# Patient Record
Sex: Female | Born: 1950 | Race: Black or African American | Hispanic: No | Smoking: Former smoker
Health system: Southern US, Community
[De-identification: ages and names within clinical notes are randomized; demographics above are authoritative.]

## PROBLEM LIST (undated history)

## (undated) DIAGNOSIS — E559 Vitamin D deficiency, unspecified: Secondary | ICD-10-CM

## (undated) DIAGNOSIS — G4730 Sleep apnea, unspecified: Secondary | ICD-10-CM

## (undated) DIAGNOSIS — R0602 Shortness of breath: Secondary | ICD-10-CM

## (undated) DIAGNOSIS — J45909 Unspecified asthma, uncomplicated: Secondary | ICD-10-CM

## (undated) DIAGNOSIS — K219 Gastro-esophageal reflux disease without esophagitis: Secondary | ICD-10-CM

## (undated) DIAGNOSIS — M1711 Unilateral primary osteoarthritis, right knee: Secondary | ICD-10-CM

## (undated) DIAGNOSIS — E7800 Pure hypercholesterolemia, unspecified: Secondary | ICD-10-CM

## (undated) DIAGNOSIS — M1990 Unspecified osteoarthritis, unspecified site: Secondary | ICD-10-CM

## (undated) DIAGNOSIS — I10 Essential (primary) hypertension: Secondary | ICD-10-CM

## (undated) DIAGNOSIS — J439 Emphysema, unspecified: Secondary | ICD-10-CM

## (undated) DIAGNOSIS — Z91018 Allergy to other foods: Secondary | ICD-10-CM

## (undated) DIAGNOSIS — E739 Lactose intolerance, unspecified: Secondary | ICD-10-CM

## (undated) DIAGNOSIS — E119 Type 2 diabetes mellitus without complications: Secondary | ICD-10-CM

## (undated) DIAGNOSIS — J449 Chronic obstructive pulmonary disease, unspecified: Secondary | ICD-10-CM

## (undated) DIAGNOSIS — M2550 Pain in unspecified joint: Secondary | ICD-10-CM

## (undated) HISTORY — DX: Gastro-esophageal reflux disease without esophagitis: K21.9

## (undated) HISTORY — PX: TUBAL LIGATION: SHX77

## (undated) HISTORY — DX: Allergy to other foods: Z91.018

## (undated) HISTORY — DX: Lactose intolerance, unspecified: E73.9

## (undated) HISTORY — DX: Vitamin D deficiency, unspecified: E55.9

## (undated) HISTORY — DX: Emphysema, unspecified: J43.9

## (undated) HISTORY — DX: Pure hypercholesterolemia, unspecified: E78.00

## (undated) HISTORY — DX: Pain in unspecified joint: M25.50

## (undated) HISTORY — PX: HEMORRHOID SURGERY: SHX153

## (undated) HISTORY — PX: COLONOSCOPY: SHX174

## (undated) HISTORY — PX: JOINT REPLACEMENT: SHX530

## (undated) HISTORY — DX: Chronic obstructive pulmonary disease, unspecified: J44.9

---

## 1996-07-22 HISTORY — PX: REDUCTION MAMMAPLASTY: SUR839

## 1997-12-07 ENCOUNTER — Ambulatory Visit (HOSPITAL_COMMUNITY): Admission: RE | Admit: 1997-12-07 | Discharge: 1997-12-07 | Payer: Self-pay | Admitting: Gastroenterology

## 1998-01-18 ENCOUNTER — Other Ambulatory Visit: Admission: RE | Admit: 1998-01-18 | Discharge: 1998-01-18 | Payer: Self-pay | Admitting: Internal Medicine

## 1998-01-18 ENCOUNTER — Ambulatory Visit (HOSPITAL_COMMUNITY): Admission: RE | Admit: 1998-01-18 | Discharge: 1998-01-18 | Payer: Self-pay | Admitting: Internal Medicine

## 1998-07-13 ENCOUNTER — Ambulatory Visit (HOSPITAL_COMMUNITY): Admission: RE | Admit: 1998-07-13 | Discharge: 1998-07-13 | Payer: Self-pay | Admitting: Gastroenterology

## 1998-11-09 ENCOUNTER — Ambulatory Visit (HOSPITAL_COMMUNITY): Admission: RE | Admit: 1998-11-09 | Discharge: 1998-11-09 | Payer: Self-pay | Admitting: Surgery

## 1999-01-11 ENCOUNTER — Other Ambulatory Visit: Admission: RE | Admit: 1999-01-11 | Discharge: 1999-01-11 | Payer: Self-pay | Admitting: Internal Medicine

## 1999-01-11 ENCOUNTER — Encounter: Payer: Self-pay | Admitting: Internal Medicine

## 1999-01-11 ENCOUNTER — Ambulatory Visit (HOSPITAL_COMMUNITY): Admission: RE | Admit: 1999-01-11 | Discharge: 1999-01-11 | Payer: Self-pay | Admitting: Internal Medicine

## 1999-07-18 ENCOUNTER — Encounter: Payer: Self-pay | Admitting: Internal Medicine

## 1999-07-18 ENCOUNTER — Encounter: Admission: RE | Admit: 1999-07-18 | Discharge: 1999-07-18 | Payer: Self-pay | Admitting: Internal Medicine

## 2000-01-17 ENCOUNTER — Ambulatory Visit (HOSPITAL_COMMUNITY): Admission: RE | Admit: 2000-01-17 | Discharge: 2000-01-17 | Payer: Self-pay | Admitting: Internal Medicine

## 2000-01-17 ENCOUNTER — Other Ambulatory Visit: Admission: RE | Admit: 2000-01-17 | Discharge: 2000-01-17 | Payer: Self-pay | Admitting: Internal Medicine

## 2000-01-17 ENCOUNTER — Encounter: Payer: Self-pay | Admitting: Internal Medicine

## 2000-07-18 ENCOUNTER — Encounter: Admission: RE | Admit: 2000-07-18 | Discharge: 2000-07-18 | Payer: Self-pay | Admitting: Internal Medicine

## 2000-07-18 ENCOUNTER — Encounter: Payer: Self-pay | Admitting: Internal Medicine

## 2001-01-05 ENCOUNTER — Encounter: Payer: Self-pay | Admitting: Internal Medicine

## 2001-01-05 ENCOUNTER — Ambulatory Visit (HOSPITAL_COMMUNITY): Admission: RE | Admit: 2001-01-05 | Discharge: 2001-01-05 | Payer: Self-pay | Admitting: Internal Medicine

## 2001-01-05 ENCOUNTER — Other Ambulatory Visit: Admission: RE | Admit: 2001-01-05 | Discharge: 2001-01-05 | Payer: Self-pay | Admitting: Internal Medicine

## 2001-07-21 ENCOUNTER — Encounter: Admission: RE | Admit: 2001-07-21 | Discharge: 2001-07-21 | Payer: Self-pay | Admitting: Internal Medicine

## 2001-07-21 ENCOUNTER — Encounter: Payer: Self-pay | Admitting: Internal Medicine

## 2001-11-27 ENCOUNTER — Encounter (INDEPENDENT_AMBULATORY_CARE_PROVIDER_SITE_OTHER): Payer: Self-pay | Admitting: Specialist

## 2001-11-27 ENCOUNTER — Ambulatory Visit (HOSPITAL_BASED_OUTPATIENT_CLINIC_OR_DEPARTMENT_OTHER): Admission: RE | Admit: 2001-11-27 | Discharge: 2001-11-27 | Payer: Self-pay | Admitting: Surgery

## 2002-01-04 ENCOUNTER — Ambulatory Visit (HOSPITAL_COMMUNITY): Admission: RE | Admit: 2002-01-04 | Discharge: 2002-01-04 | Payer: Self-pay | Admitting: Internal Medicine

## 2002-01-04 ENCOUNTER — Encounter: Payer: Self-pay | Admitting: Internal Medicine

## 2002-01-04 ENCOUNTER — Other Ambulatory Visit: Admission: RE | Admit: 2002-01-04 | Discharge: 2002-01-04 | Payer: Self-pay | Admitting: Obstetrics and Gynecology

## 2002-03-03 ENCOUNTER — Ambulatory Visit (HOSPITAL_COMMUNITY): Admission: RE | Admit: 2002-03-03 | Discharge: 2002-03-03 | Payer: Self-pay | Admitting: Gastroenterology

## 2002-03-03 ENCOUNTER — Encounter: Payer: Self-pay | Admitting: Gastroenterology

## 2002-03-09 ENCOUNTER — Ambulatory Visit (HOSPITAL_COMMUNITY): Admission: RE | Admit: 2002-03-09 | Discharge: 2002-03-09 | Payer: Self-pay | Admitting: Gastroenterology

## 2002-07-21 ENCOUNTER — Encounter: Payer: Self-pay | Admitting: Emergency Medicine

## 2002-07-21 ENCOUNTER — Emergency Department (HOSPITAL_COMMUNITY): Admission: EM | Admit: 2002-07-21 | Discharge: 2002-07-21 | Payer: Self-pay | Admitting: Emergency Medicine

## 2002-07-23 ENCOUNTER — Encounter: Admission: RE | Admit: 2002-07-23 | Discharge: 2002-07-23 | Payer: Self-pay | Admitting: Internal Medicine

## 2002-07-23 ENCOUNTER — Encounter: Payer: Self-pay | Admitting: Internal Medicine

## 2003-03-21 ENCOUNTER — Encounter: Payer: Self-pay | Admitting: Internal Medicine

## 2003-03-21 ENCOUNTER — Other Ambulatory Visit: Admission: RE | Admit: 2003-03-21 | Discharge: 2003-03-21 | Payer: Self-pay | Admitting: Obstetrics and Gynecology

## 2003-03-21 ENCOUNTER — Encounter: Admission: RE | Admit: 2003-03-21 | Discharge: 2003-03-21 | Payer: Self-pay | Admitting: Internal Medicine

## 2003-08-23 ENCOUNTER — Emergency Department (HOSPITAL_COMMUNITY): Admission: EM | Admit: 2003-08-23 | Discharge: 2003-08-23 | Payer: Self-pay | Admitting: Emergency Medicine

## 2003-09-14 ENCOUNTER — Encounter: Admission: RE | Admit: 2003-09-14 | Discharge: 2003-09-14 | Payer: Self-pay | Admitting: Internal Medicine

## 2003-11-14 ENCOUNTER — Ambulatory Visit (HOSPITAL_BASED_OUTPATIENT_CLINIC_OR_DEPARTMENT_OTHER): Admission: RE | Admit: 2003-11-14 | Discharge: 2003-11-14 | Payer: Self-pay | Admitting: Orthopedic Surgery

## 2003-11-14 ENCOUNTER — Ambulatory Visit (HOSPITAL_COMMUNITY): Admission: RE | Admit: 2003-11-14 | Discharge: 2003-11-14 | Payer: Self-pay | Admitting: Orthopedic Surgery

## 2004-01-20 ENCOUNTER — Encounter: Admission: RE | Admit: 2004-01-20 | Discharge: 2004-01-20 | Payer: Self-pay | Admitting: Gastroenterology

## 2004-03-23 ENCOUNTER — Encounter: Admission: RE | Admit: 2004-03-23 | Discharge: 2004-03-23 | Payer: Self-pay | Admitting: Internal Medicine

## 2004-03-23 ENCOUNTER — Other Ambulatory Visit: Admission: RE | Admit: 2004-03-23 | Discharge: 2004-03-23 | Payer: Self-pay | Admitting: Obstetrics and Gynecology

## 2004-09-21 ENCOUNTER — Encounter: Admission: RE | Admit: 2004-09-21 | Discharge: 2004-09-21 | Payer: Self-pay | Admitting: Orthopedic Surgery

## 2004-11-05 ENCOUNTER — Encounter: Admission: RE | Admit: 2004-11-05 | Discharge: 2004-11-05 | Payer: Self-pay | Admitting: Internal Medicine

## 2004-12-14 ENCOUNTER — Encounter: Admission: RE | Admit: 2004-12-14 | Discharge: 2004-12-14 | Payer: Self-pay | Admitting: Gastroenterology

## 2005-05-15 ENCOUNTER — Other Ambulatory Visit: Admission: RE | Admit: 2005-05-15 | Discharge: 2005-05-15 | Payer: Self-pay | Admitting: Obstetrics and Gynecology

## 2005-06-17 ENCOUNTER — Encounter: Admission: RE | Admit: 2005-06-17 | Discharge: 2005-06-17 | Payer: Self-pay | Admitting: Internal Medicine

## 2005-07-22 HISTORY — PX: SHOULDER ARTHROSCOPY W/ ROTATOR CUFF REPAIR: SHX2400

## 2005-08-12 ENCOUNTER — Ambulatory Visit (HOSPITAL_BASED_OUTPATIENT_CLINIC_OR_DEPARTMENT_OTHER): Admission: RE | Admit: 2005-08-12 | Discharge: 2005-08-12 | Payer: Self-pay | Admitting: Orthopedic Surgery

## 2005-12-13 ENCOUNTER — Encounter: Admission: RE | Admit: 2005-12-13 | Discharge: 2005-12-13 | Payer: Self-pay | Admitting: Obstetrics and Gynecology

## 2006-06-20 ENCOUNTER — Encounter: Admission: RE | Admit: 2006-06-20 | Discharge: 2006-06-20 | Payer: Self-pay | Admitting: Internal Medicine

## 2006-12-16 ENCOUNTER — Encounter: Admission: RE | Admit: 2006-12-16 | Discharge: 2006-12-16 | Payer: Self-pay | Admitting: Internal Medicine

## 2007-03-11 ENCOUNTER — Encounter: Admission: RE | Admit: 2007-03-11 | Discharge: 2007-03-11 | Payer: Self-pay | Admitting: Internal Medicine

## 2007-07-21 ENCOUNTER — Encounter: Admission: RE | Admit: 2007-07-21 | Discharge: 2007-07-21 | Payer: Self-pay | Admitting: Internal Medicine

## 2007-11-27 IMAGING — MG MM SCREEN MAMMOGRAM BILATERAL
5 series · 5 of 5 positions shown · non-contrast
Comparison: none

DG SCREEN MAMMOGRAM BILATERAL
Bilateral CC and MLO view(s) were taken.

SCREENING MAMMOGRAM:
There is a fibrofatty pattern.  No masses or malignant type calcifications are identified.  
Compared with prior studies.

[R CC]
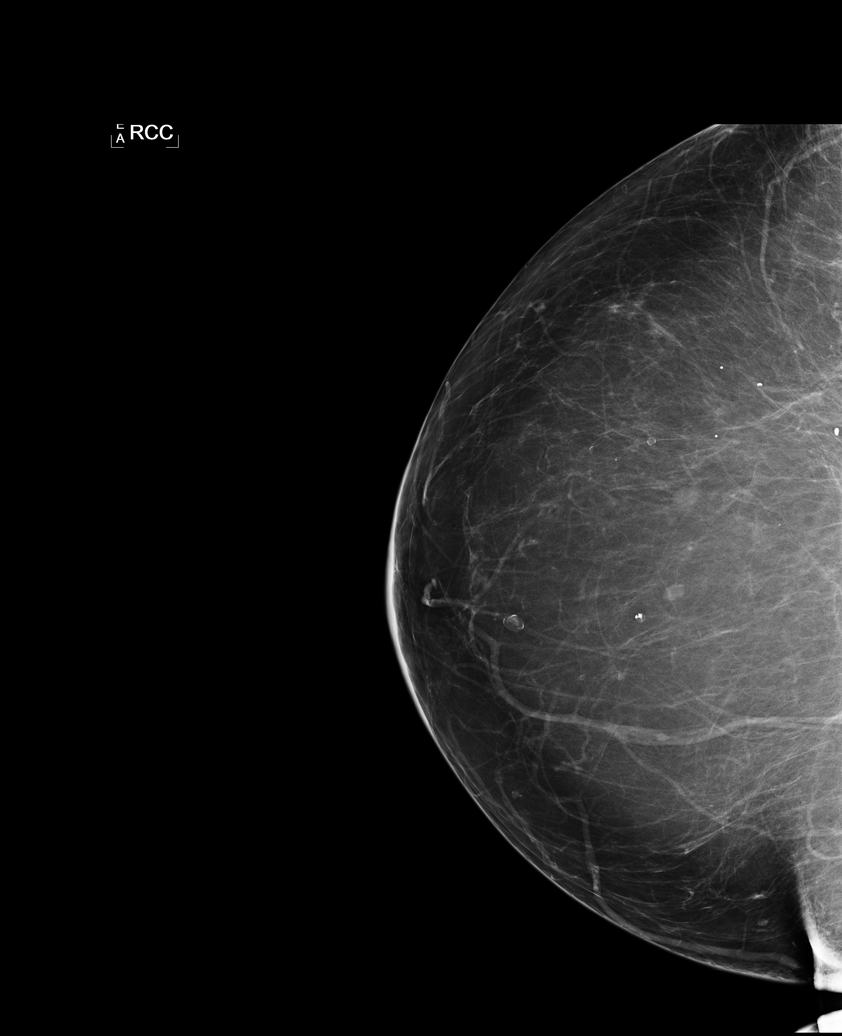

[L CC (1 of 2)]
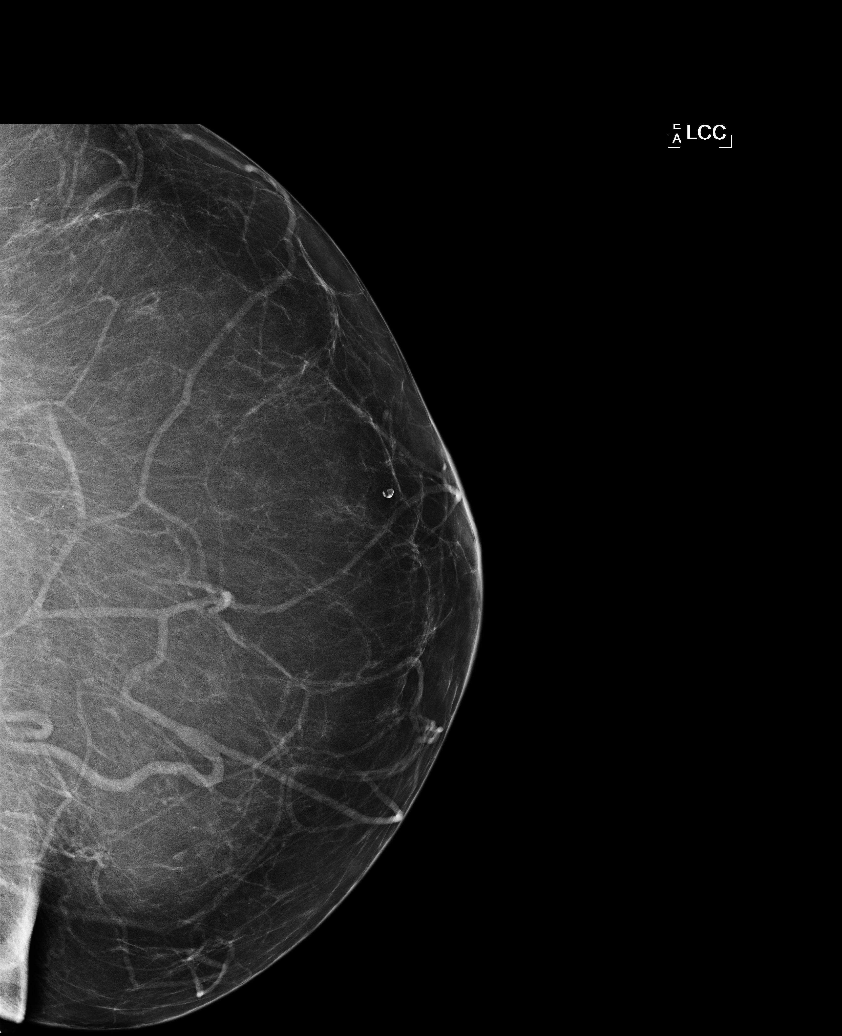

[L MLO]
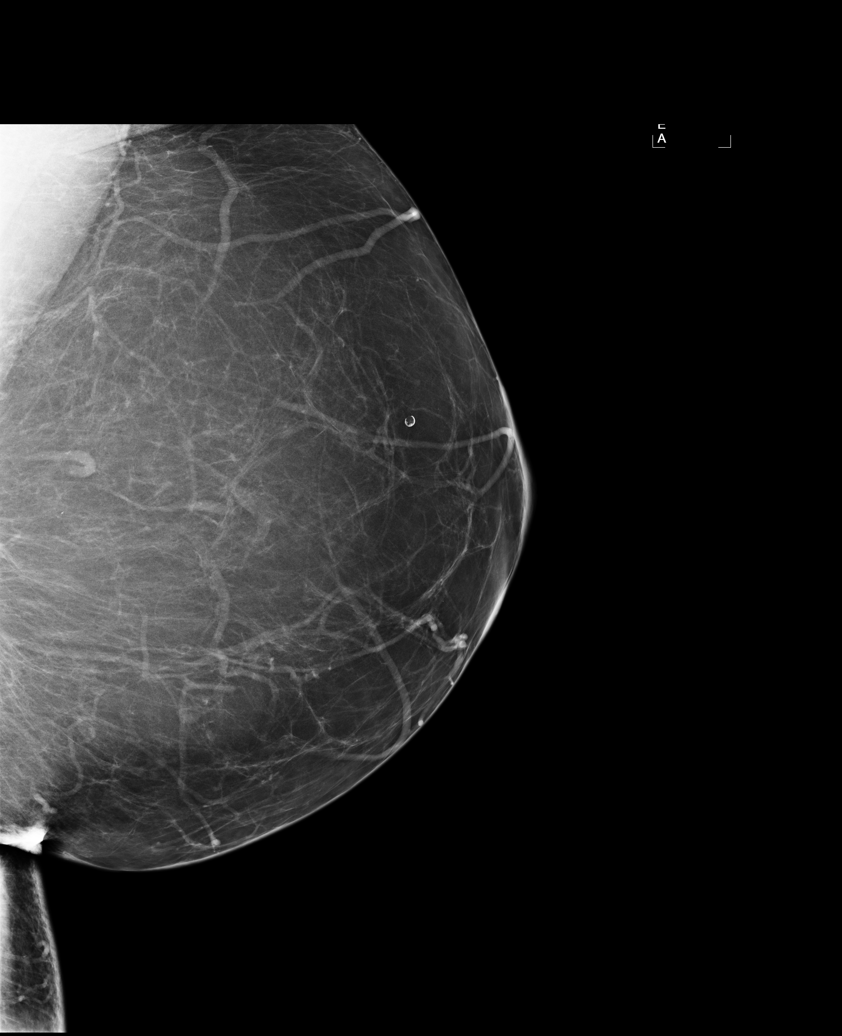

[R MLO]
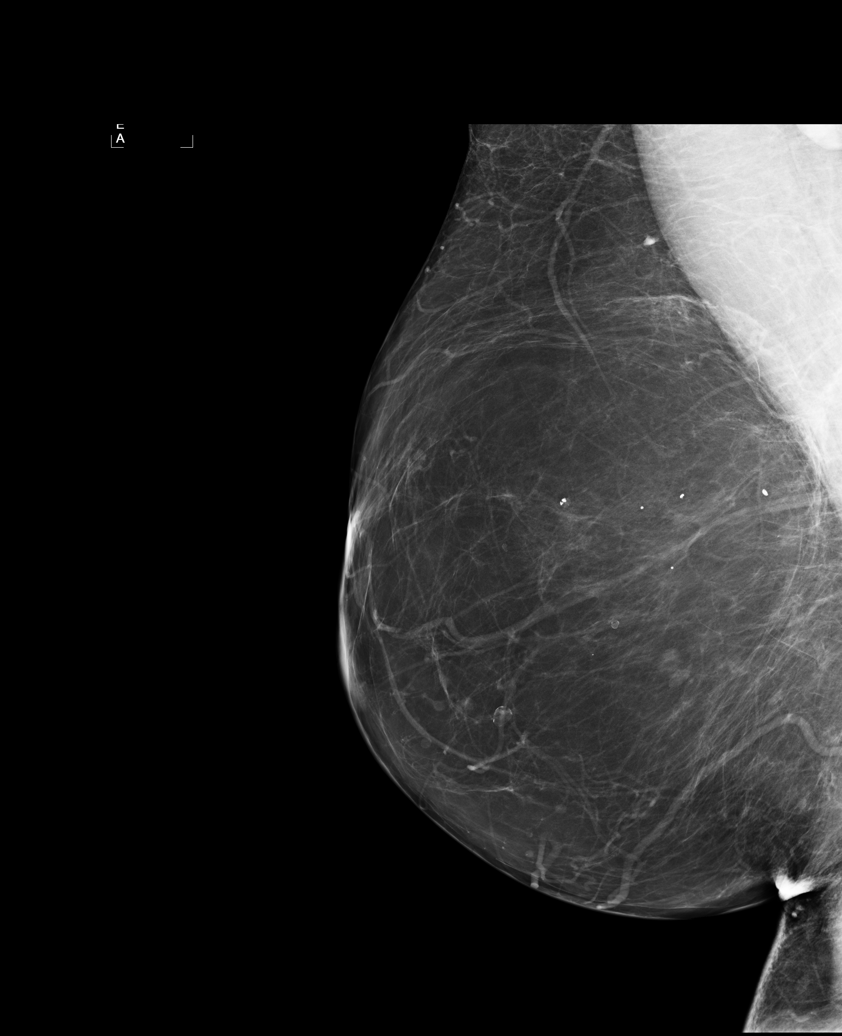

[L CC (2 of 2)]
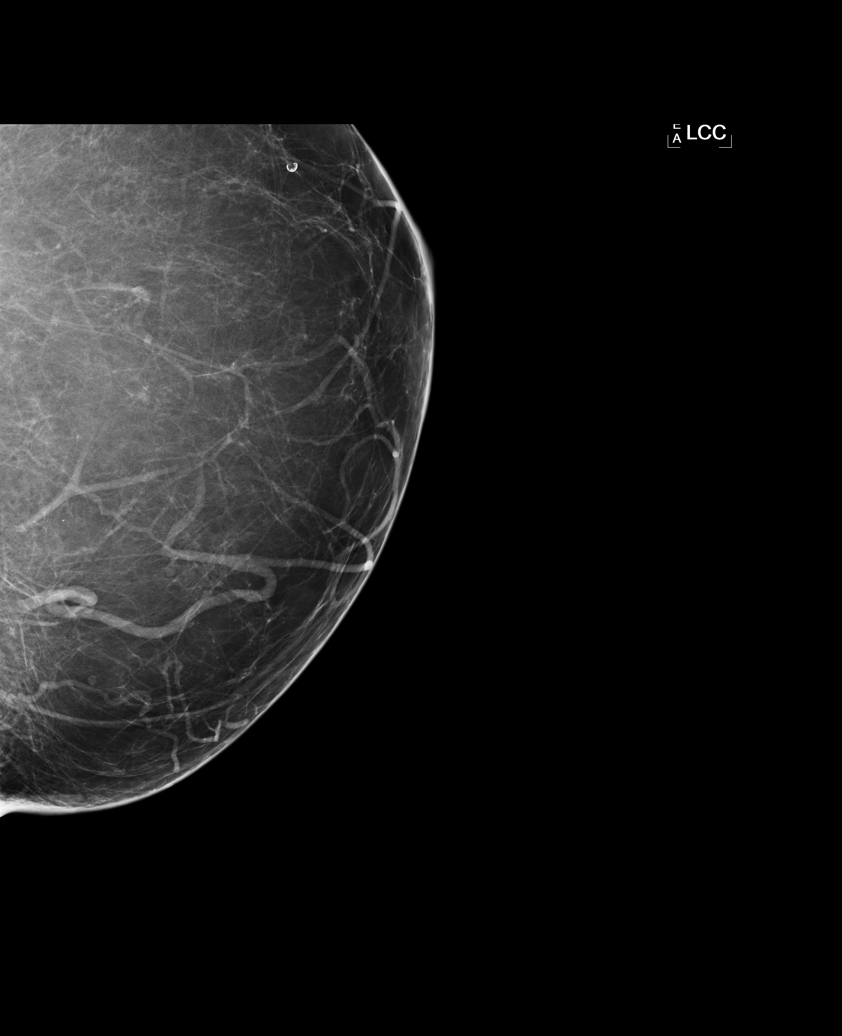

[5 of 5 positions shown; findings below may reference images not displayed]

IMPRESSION: No specific mammographic evidence of malignancy.  Next screening mammogram is recommended in one 
year.

ASSESSMENT: Negative - BI-RADS 1

Screening mammogram in 1 year.
ANALYZED BY COMPUTER AIDED DETECTION. , THIS PROCEDURE WAS A DIGITAL MAMMOGRAM.

## 2007-12-07 ENCOUNTER — Encounter: Admission: RE | Admit: 2007-12-07 | Discharge: 2007-12-07 | Payer: Self-pay | Admitting: Orthopedic Surgery

## 2007-12-22 ENCOUNTER — Encounter: Admission: RE | Admit: 2007-12-22 | Discharge: 2007-12-22 | Payer: Self-pay | Admitting: Obstetrics and Gynecology

## 2008-07-25 ENCOUNTER — Encounter: Admission: RE | Admit: 2008-07-25 | Discharge: 2008-07-25 | Payer: Self-pay | Admitting: Internal Medicine

## 2008-09-02 ENCOUNTER — Emergency Department (HOSPITAL_COMMUNITY): Admission: EM | Admit: 2008-09-02 | Discharge: 2008-09-02 | Payer: Self-pay | Admitting: Family Medicine

## 2009-01-10 ENCOUNTER — Encounter: Admission: RE | Admit: 2009-01-10 | Discharge: 2009-01-10 | Payer: Self-pay | Admitting: Internal Medicine

## 2009-09-29 HISTORY — PX: KNEE ARTHROSCOPY W/ MENISCECTOMY: SHX1879

## 2010-01-29 ENCOUNTER — Encounter: Admission: RE | Admit: 2010-01-29 | Discharge: 2010-01-29 | Payer: Self-pay | Admitting: Internal Medicine

## 2010-05-16 ENCOUNTER — Encounter
Admission: RE | Admit: 2010-05-16 | Discharge: 2010-05-16 | Payer: Self-pay | Source: Home / Self Care | Attending: Internal Medicine | Admitting: Internal Medicine

## 2010-09-17 ENCOUNTER — Inpatient Hospital Stay (INDEPENDENT_AMBULATORY_CARE_PROVIDER_SITE_OTHER)
Admission: RE | Admit: 2010-09-17 | Discharge: 2010-09-17 | Disposition: A | Discharge status: Home / Self Care | Payer: 59 | Source: Ambulatory Visit | Attending: Emergency Medicine | Admitting: Emergency Medicine

## 2010-09-17 DIAGNOSIS — J069 Acute upper respiratory infection, unspecified: Secondary | ICD-10-CM
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
## 2010-12-07 NOTE — Op Note (Signed)
   NAME:  Breanna Ross, Breanna Ross                         ACCOUNT NO.:  [id number]   MEDICAL RECORD NO.:  [id number]                   PATIENT TYPE:  AMB   LOCATION:  ENDO                                 FACILITY:  [facility]   PHYSICIAN:  Charna Elizabeth, M.D.                   DATE OF BIRTH:  [birth year]   DATE OF PROCEDURE:  03/09/2002  DATE OF DISCHARGE:                                 OPERATIVE REPORT   PROCEDURE PERFORMED:  Esophagogastroduodenoscopy.   ENDOSCOPIST:  Charna Elizabeth, M.D.   INSTRUMENT USED:  Olympus video panendoscope.   INDICATIONS FOR PROCEDURE:  The patient is a 60 year old African-American  female with history of epigastric right upper quadrant pain and chest pain.  Normal  abdominal ultrasound and HIDA scan, rule out peptic ulcer disease,  esophagitis, gastritis etc.   PREPROCEDURE PREPARATION:  Informed consent was procured from the patient.  The patient was fasted for eight hours prior to the procedure.   PREPROCEDURE PHYSICAL:  The patient had stable vital signs.  Neck supple.  Chest clear to auscultation.  S1, S2 regular.  Abdomen soft with normal  bowel sounds.   DESCRIPTION OF PROCEDURE:  The patient was placed in left lateral decubitus  position and sedated with 50 mg of Demerol and 5 mg of Versed intravenously.  Once the patient was adequately sedated and maintained on low-flow oxygen  and continuous cardiac monitoring, the Olympus video panendoscope was  advanced through the mouthpiece over the tongue into the esophagus under  direct vision.  The entire esophagus appeared normal with no evidence of  ring, strictures, masses or esophagitis or Barrett's mucosa.  The scope was  then advanced to the stomach.  The entire gastric mucosa and the proximal  small bowel appeared normal.  The patient tolerated the procedure well  without complications.   IMPRESSION:  Normal esophagogastroduodenoscopy.   RECOMMENDATIONS:  1. Proceed with a cardiac evaluation at this time  and possible stress test.  2. Outpatient follow-up in the next two weeks.  3. Use Nexium on a regular basis 40 mg 1 p.o. q.d.  4. Avoid all nonsteroidals including aspirin for now.                                               Charna Elizabeth, M.D.    JM/MEDQ  D:  03/09/2002  T:  03/10/2002  Job:  [id number]   cc:   Merlene Laughter. Renae Gloss, M.D.

## 2010-12-07 NOTE — Op Note (Signed)
[name], [name]               ACCOUNT NO.:  [id number]   MEDICAL RECORD NO.:  [id number]          PATIENT TYPE:  AMB   LOCATION:  DSC                          FACILITY:  [facility]   PHYSICIAN:  Robert A. Thurston Hole, M.D. DATE OF BIRTH:  12-06-1950   DATE OF PROCEDURE:  08/12/2005  DATE OF DISCHARGE:                                 OPERATIVE REPORT   PREOPERATIVE DIAGNOSES:  1.  Right shoulder rotator cuff tear, two years status post repair.  2.  Retained hardware/sutures, right shoulder.  3.  Right shoulder partial biceps tendon tear and partial labrum tear.   POSTOPERATIVE DIAGNOSES:  1.  Right shoulder rotator cuff tear, two years status post repair.  2.  Retained hardware/sutures, right shoulder.  3.  Right shoulder partial biceps tendon tear and partial labrum tear.   OPERATION PERFORMED:  1.  Right shoulder examination under anesthesia followed by arthroscopically      assisted rotator cuff revision repair using Arthrex suture anchors x2.  2.  Right shoulder removal of retained hardware/sutures.  3.  Right shoulder debridement, partial biceps tendon tear and partial      labrum tear.   SURGEON:  Elana Alm. Thurston Hole, M.D.   ASSISTANT:  Julien Girt, P.A.   ANESTHESIA:  General.   OPERATIVE TIME:  One hour.   COMPLICATIONS:  None.   INDICATIONS FOR PROCEDURE:  Breanna Ross is a 60 year old woman who had  previously undergone a right shoulder rotator cuff repair approximately two  years ago.  Subsequent to this, she had significant problems postoperatively  consistent with a possible retear of the repair versus poor healing of the  repair.  MRI done approximately 10 months ago has revealed retear of the  repair and thus she is to undergo arthroscopy, debridement and revision  repair if the remaining rotator cuff tear is repairable.   DESCRIPTION OF PROCEDURE:  [name] is brought to the operating room on  August 12, 2005 after an interscalene block was placed in  the holding room  by anesthesia.  She was placed on the operating table in supine position.  After being placed under general anesthesia, her right shoulder was  examined.  She had full range of motion and her shoulder was stable to  ligamentous exam.  She was then placed in a beach chair position and her  shoulder and arm were prepped using sterile DuraPrep and draped using  sterile technique.  She received Ancef 1 g IV preoperatively for  prophylaxis.  Initially, the arthroscopy was performed through a posterior  arthroscopic portal.  The arthroscope with a pump attached was placed and  through an anterior portal, an arthroscopic probe was placed.  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  She had 50% tearing in the biceps tendon which  was debrided, but the rest was intact.  She had partial tearing of the  superior anterior labrum which was debrided 25%.  The biceps tendon anchor  was intact.  The inferior labrum and anterior inferior glenohumeral ligament  complex was  intact.  The posterior labrum partial tearing 25% which was  debrided.  The rotator cuff showed a complete retear with retained suture  material in the retained portions of the rotator cuff tear which included  the supraspinatus and the infraspinatus and these sutures were removed.  A  lateral arthroscopic portal was made.  Moderately thickened bursitis was  resected.  After the sutures were removed, the rotator cuff tear was further  evaluated.  It did appear to be repairable.  It was debrided back to more  healthy-appearing tissue and then two separate Arthrex 5.5 mm suture anchors  were placed in the greater tuberosity after the bed area of the tuberosity  was repaired.  This was done through an accessory portal.  Each of these  anchors had two sutures in them and then each of the sutures were passed  arthroscopically through the rotator cuff  tear and then sequentially tied  down thus repairing the rotator cuff tear back down to the greater  tuberosity with a firm and tight repair.  After this was done, the shoulder  could be brought through a full range of motion with no impingement on the  repair.  At this point, it was felt that all pathology had been  satisfactorily addressed.  The instruments were removed.  Portals were  closed with 3-0 nylon sutures.  Sterile dressings and a sling applied.  The  patient was awakened and taken to recovery in stable condition.   FOLLOW-UP CARE:  Ms. Sieg will be followed as an outpatient on Percocet  for pain.  She will be seen back in the office in a week for suture removal  and follow-up.  Will begin early physical therapy with passive range of  motion only.      Robert A. Thurston Hole, M.D.  Electronically Signed     RAW/MEDQ  D:  08/12/2005  T:  08/13/2005  Job:  [id number]

## 2011-01-17 ENCOUNTER — Other Ambulatory Visit: Payer: Self-pay | Admitting: Internal Medicine

## 2011-01-17 DIAGNOSIS — Z1231 Encounter for screening mammogram for malignant neoplasm of breast: Secondary | ICD-10-CM

## 2011-02-08 ENCOUNTER — Ambulatory Visit
Admission: RE | Admit: 2011-02-08 | Discharge: 2011-02-08 | Disposition: A | Discharge status: Home / Self Care | Payer: 59 | Source: Ambulatory Visit | Attending: Internal Medicine | Admitting: Internal Medicine

## 2011-02-08 DIAGNOSIS — Z1231 Encounter for screening mammogram for malignant neoplasm of breast: Secondary | ICD-10-CM

## 2011-05-24 ENCOUNTER — Other Ambulatory Visit: Payer: Self-pay | Admitting: Internal Medicine

## 2011-05-24 ENCOUNTER — Ambulatory Visit
Admission: RE | Admit: 2011-05-24 | Discharge: 2011-05-24 | Disposition: A | Discharge status: Home / Self Care | Payer: 59 | Source: Ambulatory Visit | Attending: Internal Medicine | Admitting: Internal Medicine

## 2011-05-24 DIAGNOSIS — R0602 Shortness of breath: Secondary | ICD-10-CM

## 2011-05-24 DIAGNOSIS — R05 Cough: Secondary | ICD-10-CM

## 2011-05-24 DIAGNOSIS — Z87891 Personal history of nicotine dependence: Secondary | ICD-10-CM

## 2011-05-24 DIAGNOSIS — R059 Cough, unspecified: Secondary | ICD-10-CM

## 2011-07-22 ENCOUNTER — Encounter: Payer: Self-pay | Admitting: *Deleted

## 2011-07-22 ENCOUNTER — Emergency Department (HOSPITAL_COMMUNITY)
Admission: EM | Admit: 2011-07-22 | Discharge: 2011-07-22 | Disposition: A | Discharge status: Home / Self Care | Payer: 59 | Source: Home / Self Care | Attending: Family Medicine | Admitting: Family Medicine

## 2011-07-22 DIAGNOSIS — J069 Acute upper respiratory infection, unspecified: Secondary | ICD-10-CM

## 2011-07-22 HISTORY — DX: Essential (primary) hypertension: I10

## 2011-07-22 MED ORDER — AZITHROMYCIN 250 MG PO TABS: ORAL_TABLET | ORAL | Status: AC

## 2011-07-22 MED ORDER — IPRATROPIUM BROMIDE 0.06 % NA SOLN: 2.0000 | Freq: Four times a day (QID) | NASAL | Status: DC

## 2011-07-22 MED ORDER — HYDROCOD POLST-CHLORPHEN POLST 10-8 MG/5ML PO LQCR: 5.0000 mL | Freq: Two times a day (BID) | ORAL | Status: DC

## 2011-07-22 NOTE — ED Provider Notes (Signed)
History     CSN: [id number]  Arrival date & time 07/22/11  1002   First MD Initiated Contact with Patient 07/22/11 1301      Chief Complaint  Patient presents with  . Cough    (Consider location/radiation/quality/duration/timing/severity/associated sxs/prior treatment) Patient is a 60 y.o. female presenting with cough. The history is provided by the patient.  Cough This is a new problem. The current episode started more than 2 days ago. The problem has been gradually worsening. The cough is non-productive. The maximum temperature recorded prior to her arrival was 100 to 100.9 F. The fever has been present for less than 1 day. Associated symptoms include ear pain, rhinorrhea and sore throat. Pertinent negatives include no wheezing. She has tried cough syrup for the symptoms. The treatment provided no relief. She is not a smoker.    Past Medical History  Diagnosis Date  . Hypertension     Past Surgical History  Procedure Date  . Breast surgery   . Hemorrhoid surgery     Family History  Problem Relation Age of Onset  . Hypertension Mother   . Hypertension Father     History  Substance Use Topics  . Smoking status: Never Smoker   . Smokeless tobacco: Not on file  . Alcohol Use: No    OB History    Grav Para Term Preterm Abortions TAB SAB Ect Mult Living                  Review of Systems  Constitutional: Positive for fever.  HENT: Positive for ear pain, congestion, sore throat, rhinorrhea and postnasal drip.   Respiratory: Positive for cough. Negative for wheezing.   Gastrointestinal: Negative.   Skin: Negative.     Allergies  Penicillins  Home Medications   Current Outpatient Rx  Name Route Sig Dispense Refill  . AZITHROMYCIN 250 MG PO TABS  Take as directed on pack 6 each 0  . HYDROCOD POLST-CHLORPHEN POLST 10-8 MG/5ML PO LQCR Oral Take 5 mLs by mouth every 12 (twelve) hours. 115 mL 0  . IPRATROPIUM BROMIDE 0.06 % NA SOLN Nasal Place 2 sprays into the  nose 4 (four) times daily. 15 mL 12    BP 135/94  Pulse 90  Temp(Src) 98.4 F (36.9 C) (Oral)  Resp 22  SpO2 97%  Physical Exam  Nursing note and vitals reviewed. Constitutional: She is oriented to person, place, and time. She appears well-developed and well-nourished.  HENT:  Head: Normocephalic.  Right Ear: External ear normal.  Left Ear: External ear normal.  Nose: Mucosal edema and rhinorrhea present.  Mouth/Throat: Oropharynx is clear and moist.  Neck: Normal range of motion. Neck supple.  Cardiovascular: Normal rate, regular rhythm, normal heart sounds and intact distal pulses.   Pulmonary/Chest: Effort normal and breath sounds normal.  Abdominal: Soft.  Lymphadenopathy:    She has no cervical adenopathy.  Neurological: She is alert and oriented to person, place, and time.  Skin: Skin is warm and dry.    ED Course  Procedures (including critical care time)  Labs Reviewed - No data to display No results found.   1. URI (upper respiratory infection)       MDM          Barkley Bruns, MD 07/22/11 [phone]

## 2011-07-22 NOTE — ED Notes (Signed)
Pt  Has  Symptoms  Of  Cough  Congestion  sorethroat        Body  Aches    X  3  Days  Reports  Had  Fever      sev  Days  Ago  As  Well

## 2011-12-02 ENCOUNTER — Emergency Department (INDEPENDENT_AMBULATORY_CARE_PROVIDER_SITE_OTHER)
Admission: EM | Admit: 2011-12-02 | Discharge: 2011-12-02 | Disposition: A | Discharge status: Home / Self Care | Payer: 59 | Source: Home / Self Care | Attending: Emergency Medicine | Admitting: Emergency Medicine

## 2011-12-02 ENCOUNTER — Encounter (HOSPITAL_COMMUNITY): Payer: Self-pay

## 2011-12-02 DIAGNOSIS — J329 Chronic sinusitis, unspecified: Secondary | ICD-10-CM

## 2011-12-02 MED ORDER — AZITHROMYCIN 250 MG PO TABS: 250.0000 mg | ORAL_TABLET | Freq: Every day | ORAL | Status: DC

## 2011-12-02 MED ORDER — AZITHROMYCIN 250 MG PO TABS: 250.0000 mg | ORAL_TABLET | Freq: Every day | ORAL | Status: AC

## 2011-12-02 MED ORDER — HYDROCOD POLST-CHLORPHEN POLST 10-8 MG/5ML PO LQCR: 5.0000 mL | Freq: Two times a day (BID) | ORAL | Status: DC

## 2011-12-02 NOTE — ED Provider Notes (Signed)
Medical screening examination/treatment/procedure(s) were performed by non-physician practitioner and as supervising physician I was immediately available for consultation/collaboration.  Raynald Blend, MD 12/02/11 1526

## 2011-12-02 NOTE — ED Provider Notes (Signed)
History     CSN: [id number]  Arrival date & time 12/02/11  1202   First MD Initiated Contact with Patient 12/02/11 1357      Chief Complaint  Patient presents with  . Facial Pain    (Consider location/radiation/quality/duration/timing/severity/associated sxs/prior treatment) Patient is a 61 y.o. female presenting with cough. The history is provided by the patient. No language interpreter was used.  Cough This is a new problem. The current episode started more than 2 days ago. The problem occurs constantly. The problem has been gradually worsening. The cough is productive of sputum. There has been no fever. Associated symptoms include ear congestion, headaches and rhinorrhea. She has tried decongestants for the symptoms. The treatment provided no relief. She is not a smoker.  Pt complains of sinus pressure and congestion  Past Medical History  Diagnosis Date  . Hypertension     Past Surgical History  Procedure Date  . Breast surgery   . Hemorrhoid surgery     Family History  Problem Relation Age of Onset  . Hypertension Mother   . Hypertension Father     History  Substance Use Topics  . Smoking status: Never Smoker   . Smokeless tobacco: Not on file  . Alcohol Use: No    OB History    Grav Para Term Preterm Abortions TAB SAB Ect Mult Living                  Review of Systems  HENT: Positive for rhinorrhea.   Respiratory: Positive for cough.   Neurological: Positive for headaches.  All other systems reviewed and are negative.    Allergies  Penicillins  Home Medications   Current Outpatient Rx  Name Route Sig Dispense Refill  . ESOMEPRAZOLE MAGNESIUM 40 MG PO CPDR Oral Take 40 mg by mouth daily before breakfast.    . MELOXICAM 15 MG PO TABS Oral Take 15 mg by mouth daily.    . MOMETASONE FURO-FORMOTEROL FUM 100-5 MCG/ACT IN AERO Inhalation Inhale 2 puffs into the lungs 2 (two) times daily.    Marland Kitchen MONTELUKAST SODIUM 10 MG PO TABS Oral Take 10 mg by mouth  at bedtime.    Marland Kitchen OLMESARTAN MEDOXOMIL-HCTZ 40-25 MG PO TABS Oral Take 1 tablet by mouth daily.    . AZITHROMYCIN 250 MG PO TABS Oral Take 1 tablet (250 mg total) by mouth daily. Take first 2 tablets together, then 1 every day until finished. 6 tablet 0  . HYDROCOD POLST-CPM POLST ER 10-8 MG/5ML PO LQCR Oral Take 5 mLs by mouth every 12 (twelve) hours. 115 mL 0  . IPRATROPIUM BROMIDE 0.06 % NA SOLN Nasal Place 2 sprays into the nose 4 (four) times daily. 15 mL 12    BP 150/110  Pulse 89  Temp(Src) 98.6 F (37 C) (Oral)  Resp 18  SpO2 96%  Physical Exam  Nursing note and vitals reviewed. Constitutional: She is oriented to person, place, and time. She appears well-developed and well-nourished.  HENT:  Head: Normocephalic and atraumatic.  Right Ear: External ear normal.  Left Ear: External ear normal.  Nose: Nose normal.  Mouth/Throat: Oropharynx is clear and moist.       Tender bilat maxillary sinuses  Eyes: Conjunctivae and EOM are normal. Pupils are equal, round, and reactive to light.  Neck: Normal range of motion. Neck supple.  Cardiovascular: Normal rate and normal heart sounds.   Pulmonary/Chest: Effort normal.  Musculoskeletal: Normal range of motion.  Neurological: She is alert and  oriented to person, place, and time. She has normal reflexes.  Skin: Skin is warm.  Psychiatric: She has a normal mood and affect.    ED Course  Procedures (including critical care time)  Labs Reviewed - No data to display No results found.   1. Sinusitis       MDM  Zithromax,  tussionex        Lonia Skinner [place], [place] 12/02/11 1423

## 2011-12-02 NOTE — ED Notes (Signed)
C/o pain and pressure  in face, green nasal secretions, ST from coughing

## 2011-12-02 NOTE — Discharge Instructions (Signed)

## 2011-12-02 NOTE — ED Notes (Signed)
Pt d/c'd w 2 rx; counciled regarding medication compliance and safety

## 2012-01-30 ENCOUNTER — Other Ambulatory Visit: Payer: Self-pay | Admitting: Internal Medicine

## 2012-01-30 DIAGNOSIS — Z9889 Other specified postprocedural states: Secondary | ICD-10-CM

## 2012-01-30 DIAGNOSIS — Z1231 Encounter for screening mammogram for malignant neoplasm of breast: Secondary | ICD-10-CM

## 2012-02-17 ENCOUNTER — Ambulatory Visit
Admission: RE | Admit: 2012-02-17 | Discharge: 2012-02-17 | Disposition: A | Discharge status: Home / Self Care | Payer: 59 | Source: Ambulatory Visit | Attending: Internal Medicine | Admitting: Internal Medicine

## 2012-02-17 DIAGNOSIS — Z9889 Other specified postprocedural states: Secondary | ICD-10-CM

## 2012-02-17 DIAGNOSIS — Z1231 Encounter for screening mammogram for malignant neoplasm of breast: Secondary | ICD-10-CM

## 2013-01-12 ENCOUNTER — Other Ambulatory Visit: Payer: Self-pay

## 2013-01-12 DIAGNOSIS — Z1231 Encounter for screening mammogram for malignant neoplasm of breast: Secondary | ICD-10-CM

## 2013-02-18 ENCOUNTER — Other Ambulatory Visit: Payer: Self-pay | Admitting: Orthopedic Surgery

## 2013-02-18 DIAGNOSIS — M25461 Effusion, right knee: Secondary | ICD-10-CM

## 2013-02-18 DIAGNOSIS — M25561 Pain in right knee: Secondary | ICD-10-CM

## 2013-02-22 ENCOUNTER — Ambulatory Visit
Admission: RE | Admit: 2013-02-22 | Discharge: 2013-02-22 | Disposition: A | Discharge status: Home / Self Care | Payer: 59 | Source: Ambulatory Visit | Attending: Orthopedic Surgery | Admitting: Orthopedic Surgery

## 2013-02-22 DIAGNOSIS — M25461 Effusion, right knee: Secondary | ICD-10-CM

## 2013-02-22 DIAGNOSIS — M25561 Pain in right knee: Secondary | ICD-10-CM

## 2013-03-19 ENCOUNTER — Ambulatory Visit
Admission: RE | Admit: 2013-03-19 | Discharge: 2013-03-19 | Disposition: A | Discharge status: Home / Self Care | Payer: 59 | Source: Ambulatory Visit

## 2013-03-19 DIAGNOSIS — Z1231 Encounter for screening mammogram for malignant neoplasm of breast: Secondary | ICD-10-CM

## 2013-06-08 ENCOUNTER — Encounter: Payer: Self-pay | Admitting: Physician Assistant

## 2013-06-08 ENCOUNTER — Other Ambulatory Visit: Payer: Self-pay | Admitting: Physician Assistant

## 2013-06-08 DIAGNOSIS — K219 Gastro-esophageal reflux disease without esophagitis: Secondary | ICD-10-CM | POA: Insufficient documentation

## 2013-06-08 DIAGNOSIS — E119 Type 2 diabetes mellitus without complications: Secondary | ICD-10-CM | POA: Insufficient documentation

## 2013-06-08 DIAGNOSIS — I10 Essential (primary) hypertension: Secondary | ICD-10-CM

## 2013-06-08 NOTE — H&P (Signed)
TOTAL KNEE ADMISSION H&P  Patient is being admitted for left total knee arthroplasty.  Subjective:  Chief Complaint:left knee pain.  HPI: Breanna Ross, 62 y.o. female, has a history of pain and functional disability in the left knee due to arthritis and has failed non-surgical conservative treatments for greater than 12 weeks to includeNSAID's and/or analgesics, corticosteriod injections, viscosupplementation injections, flexibility and strengthening excercises, supervised PT with diminished ADL's post treatment, use of assistive devices, weight reduction as appropriate and activity modification.  Onset of symptoms was gradual, starting >10 years ago with gradually worsening course since that time. The patient noted prior procedures on the knee to include  arthroscopy and menisectomy on the left knee(s).  Patient currently rates pain in the left knee(s) at 10 out of 10 with activity. Patient has night pain, worsening of pain with activity and weight bearing, pain that interferes with activities of daily living, crepitus and joint swelling.  Patient has evidence of subchondral sclerosis, periarticular osteophytes and joint space narrowing by imaging studies. There is no active infection.  Patient Active Problem List   Diagnosis Date Noted  . Hypertension   . GERD (gastroesophageal reflux disease)   . Diabetes    Past Medical History  Diagnosis Date  . Hypertension   . GERD (gastroesophageal reflux disease)   . Left knee DJD   . Hypercholesteremia   . Diabetes     Past Surgical History  Procedure Laterality Date  . Breast surgery    . Hemorrhoid surgery    . Knee arthroscopy w/ meniscectomy Left 09-29-09     (Not in a hospital admission) Allergies  Allergen Reactions  . Penicillins     History  Substance Use Topics  . Smoking status: Former Smoker    Quit date: 06/08/2006  . Smokeless tobacco: Not on file  . Alcohol Use: No    Family History  Problem Relation Age of Onset   . Hypertension Mother   . Hypertension Father      Review of Systems  Constitutional: Negative.   HENT: Negative.   Eyes: Negative.   Respiratory: Negative.   Cardiovascular: Negative.   Gastrointestinal: Negative.   Genitourinary: Negative.   Musculoskeletal: Positive for joint pain.       Bilateral knee pain left worse than right  Skin: Negative.   Neurological: Negative.     Objective:  Physical Exam  Constitutional: She is oriented to person, place, and time. She appears well-developed and well-nourished.  HENT:  Head: Normocephalic.  Eyes: Conjunctivae are normal. Pupils are equal, round, and reactive to light.  Neck: Normal range of motion. Neck supple.  Cardiovascular: Normal rate and regular rhythm.  Exam reveals no gallop and no friction rub.   No murmur heard. Respiratory: Breath sounds normal.  GI: Soft. Bowel sounds are normal.  Genitourinary:  Not pertinent to current symptomatology therefore not examined.  Musculoskeletal:  On examination she is a well developed well nourished 62 year old female. She ambulates with a moderate antalgic gait. No cane today She has active range of motion -5 to 120 degrees bilaterally 2+ crepitus bilaterally 2+ synovitis bilaterally medial joint line tenderness bilaterally distal neurologic function is intact.  Left is more painful than right.  Neurological: She is alert and oriented to person, place, and time.  Skin: Skin is warm and dry.  Psychiatric: She has a normal mood and affect. Her behavior is normal.    Vital signs in last 24 hours: Last recorded: 11/18 0900     BP: 139/98    Temp: 98.1 F (36.7 C)    Height: 5' 5" (1.651 m) SpO2: 96  Weight: 135.172 kg (298 lb)     Labs:   Estimated body mass index is 49.59 kg/(m^2) as calculated from the following:   Height as of this encounter: 5' 5" (1.651 m).   Weight as of this encounter: 135.172 kg (298 lb).   Imaging Review Plain radiographs demonstrate severe  degenerative joint disease of the bilaterally knee(s). Left worse than right   The overall alignment issignificant varus. The bone quality appears to be good for age and reported activity level.  Assessment/Plan:  End stage arthritis, left knee  Patient Active Problem List   Diagnosis Date Noted  . Hypertension   . GERD (gastroesophageal reflux disease)   . Diabetes     The patient history, physical examination, clinical judgment of the provider and imaging studies are consistent with end stage degenerative joint disease of the left knee(s) and total knee arthroplasty is deemed medically necessary. The treatment options including medical management, injection therapy arthroscopy and arthroplasty were discussed at length. The risks and benefits of total knee arthroplasty were presented and reviewed. The risks due to aseptic loosening, infection, stiffness, patella tracking problems, thromboembolic complications and other imponderables were discussed. The patient acknowledged the explanation, agreed to proceed with the plan and consent was signed. Patient is being admitted for inpatient treatment for surgery, pain control, PT, OT, prophylactic antibiotics, VTE prophylaxis, progressive ambulation and ADL's and discharge planning. The patient is planning to be discharged home with home health services  Kathe Wirick A. Celenia Hruska, PA-C Physician Assistant [facility] [phone]  06/08/2013, 11:24 AM   

## 2013-06-09 ENCOUNTER — Encounter (HOSPITAL_COMMUNITY): Payer: Self-pay | Admitting: Pharmacy Technician

## 2013-06-11 ENCOUNTER — Encounter (HOSPITAL_COMMUNITY)
Admission: RE | Admit: 2013-06-11 | Discharge: 2013-06-11 | Disposition: A | Discharge status: Home / Self Care | Payer: 59 | Source: Ambulatory Visit | Attending: Orthopedic Surgery | Admitting: Orthopedic Surgery

## 2013-06-11 ENCOUNTER — Encounter (HOSPITAL_COMMUNITY)
Admission: RE | Admit: 2013-06-11 | Discharge: 2013-06-11 | Disposition: A | Discharge status: Home / Self Care | Payer: 59 | Source: Ambulatory Visit | Attending: Physician Assistant | Admitting: Physician Assistant

## 2013-06-11 ENCOUNTER — Encounter (HOSPITAL_COMMUNITY): Payer: Self-pay

## 2013-06-11 DIAGNOSIS — Z01818 Encounter for other preprocedural examination: Secondary | ICD-10-CM | POA: Insufficient documentation

## 2013-06-11 DIAGNOSIS — Z01812 Encounter for preprocedural laboratory examination: Secondary | ICD-10-CM | POA: Insufficient documentation

## 2013-06-11 HISTORY — DX: Unspecified asthma, uncomplicated: J45.909

## 2013-06-11 HISTORY — DX: Shortness of breath: R06.02

## 2013-06-11 LAB — COMPREHENSIVE METABOLIC PANEL
ALT: 22 U/L (ref 0–35)
AST: 20 U/L (ref 0–37)
Albumin: 3.9 g/dL (ref 3.5–5.2)
Alkaline Phosphatase: 83 U/L (ref 39–117)
BUN: 18 mg/dL (ref 6–23)
CO2: 29 mEq/L (ref 19–32)
Calcium: 9.3 mg/dL (ref 8.4–10.5)
Chloride: 101 mEq/L (ref 96–112)
Creatinine, Ser: 0.87 mg/dL (ref 0.50–1.10)
GFR calc Af Amer: 82 mL/min — ABNORMAL LOW (ref >90)
GFR calc non Af Amer: 70 mL/min — ABNORMAL LOW (ref >90)
Glucose, Bld: 105 mg/dL — ABNORMAL HIGH (ref 70–99)
Potassium: 3.5 mEq/L (ref 3.5–5.1)
Sodium: 139 mEq/L (ref 135–145)
Total Bilirubin: 0.3 mg/dL (ref 0.3–1.2)
Total Protein: 8.3 g/dL (ref 6.0–8.3)

## 2013-06-11 LAB — URINALYSIS, ROUTINE W REFLEX MICROSCOPIC
Bilirubin Urine: NEGATIVE
Glucose, UA: NEGATIVE mg/dL
Hgb urine dipstick: NEGATIVE
Ketones, ur: NEGATIVE mg/dL
Leukocytes, UA: NEGATIVE
Nitrite: NEGATIVE
Protein, ur: NEGATIVE mg/dL
Specific Gravity, Urine: 1.023 (ref 1.005–1.030)
Urobilinogen, UA: 0.2 mg/dL (ref 0.0–1.0)
pH: 6.5 (ref 5.0–8.0)

## 2013-06-11 LAB — CBC WITH DIFFERENTIAL/PLATELET
Basophils Absolute: 0 10*3/uL (ref 0.0–0.1)
Basophils Relative: 0 % (ref 0–1)
Eosinophils Absolute: 0.2 10*3/uL (ref 0.0–0.7)
Eosinophils Relative: 2 % (ref 0–5)
HCT: 39.7 % (ref 36.0–46.0)
Hemoglobin: 13 g/dL (ref 12.0–15.0)
Lymphocytes Relative: 35 % (ref 12–46)
Lymphs Abs: 2.5 10*3/uL (ref 0.7–4.0)
MCH: 28 pg (ref 26.0–34.0)
MCHC: 32.7 g/dL (ref 30.0–36.0)
MCV: 85.6 fL (ref 78.0–100.0)
Monocytes Absolute: 0.5 10*3/uL (ref 0.1–1.0)
Monocytes Relative: 6 % (ref 3–12)
Neutro Abs: 4 10*3/uL (ref 1.7–7.7)
Neutrophils Relative %: 57 % (ref 43–77)
Platelets: 286 10*3/uL (ref 150–400)
RBC: 4.64 MIL/uL (ref 3.87–5.11)
RDW: 14.4 % (ref 11.5–15.5)
WBC: 7.1 10*3/uL (ref 4.0–10.5)

## 2013-06-11 LAB — APTT: aPTT: 33 seconds (ref 24–37)

## 2013-06-11 LAB — PROTIME-INR
INR: 0.89 (ref 0.00–1.49)
Prothrombin Time: 11.9 seconds (ref 11.6–15.2)

## 2013-06-11 LAB — TYPE AND SCREEN
ABO/RH(D): O POS
Antibody Screen: NEGATIVE

## 2013-06-11 LAB — SURGICAL PCR SCREEN
MRSA, PCR: NEGATIVE
Staphylococcus aureus: NEGATIVE

## 2013-06-11 LAB — ABO/RH: ABO/RH(D): O POS

## 2013-06-11 NOTE — Pre-Procedure Instructions (Addendum)
JOCI DRESS  06/11/2013   Your procedure is scheduled on:  Monday, December 1st.  Report to [facility], Main Entrance/ Entrance "A" at 5:30AM.  Call this number if you have problems the morning of surgery: [phone]   Remember:   Do not eat food or drink liquids after midnight.   Take these medicines the morning of surgery with A SIP OF WATER: Zyrtec.  May use inhalers.  Please bring Albuterol inhaler to the hospital with you. Stop taking Aspirin, Coumadin, Plavix, Effient and Herbal medications.  Do not take any NSAIDs ie: Ibuprofen,  Advil,Naproxen, Celebrex or any medication containing Aspirin, on Monday 24th.   Do not wear jewelry, make-up or nail polish.  Do not wear lotions, powders, or perfumes. You may wear deodorant.  Do not shave 48 hours prior to surgery.  Do not bring valuables to the hospital.  [facility] is not responsible for any belongings or valuables.               Contacts, dentures or bridgework may not be worn into surgery.  Leave suitcase in the car. After surgery it may be brought to your room.  For patients admitted to the hospital, discharge time is determined by your treatment team.                 Special Instructions: Shower using CHG 2 nights before surgery and the night before surgery.  If you shower the day of surgery use CHG.  Use special wash - you have one bottle of CHG for all showers.  You should use approximately 1/3 of the bottle for each shower.   Please read over the following fact sheets that you were given: Pain Booklet, Coughing and Deep Breathing and Surgical Site Infection Prevention

## 2013-06-11 NOTE — Progress Notes (Signed)
06/11/13 0913  OBSTRUCTIVE SLEEP APNEA  Have you ever been diagnosed with sleep apnea through a sleep study? No  Do you snore loudly (loud enough to be heard through closed doors)?  1  Do you often feel tired, fatigued, or sleepy during the daytime? 0  Has anyone observed you stop breathing during your sleep? 0  Do you have, or are you being treated for high blood pressure? 1  BMI more than 35 kg/m2? 1  Age over 61 years old? 1  Neck circumference greater than 40 cm/18 inches? 0 (17)  Gender: 0  Obstructive Sleep Apnea Score 4

## 2013-06-12 LAB — URINE CULTURE: Colony Count: 7000

## 2013-06-20 MED ORDER — VANCOMYCIN HCL 10 G IV SOLR
1500.0000 mg | INTRAVENOUS | Status: AC
  Administered 2013-06-21: 1500 mg via INTRAVENOUS
  Filled 2013-06-20: qty 1500

## 2013-06-20 MED ORDER — TRANEXAMIC ACID 100 MG/ML IV SOLN
1000.0000 mg | INTRAVENOUS | Status: AC
  Administered 2013-06-21: 1000 mg via INTRAVENOUS
  Filled 2013-06-20: qty 10

## 2013-06-21 ENCOUNTER — Inpatient Hospital Stay (HOSPITAL_COMMUNITY): Payer: 59 | Admitting: Certified Registered"

## 2013-06-21 ENCOUNTER — Encounter (HOSPITAL_COMMUNITY): Payer: 59 | Admitting: Certified Registered"

## 2013-06-21 ENCOUNTER — Encounter (HOSPITAL_COMMUNITY)
Admission: RE | Disposition: A | Discharge status: Home Health | Payer: Self-pay | Source: Ambulatory Visit | Attending: Orthopedic Surgery

## 2013-06-21 ENCOUNTER — Encounter (HOSPITAL_COMMUNITY): Payer: Self-pay | Admitting: *Deleted

## 2013-06-21 ENCOUNTER — Inpatient Hospital Stay (HOSPITAL_COMMUNITY)
Admission: RE | Admit: 2013-06-21 | Discharge: 2013-06-22 | DRG: 470 | Disposition: A | Discharge status: Home Health | Payer: 59 | Source: Ambulatory Visit | Attending: Orthopedic Surgery | Admitting: Orthopedic Surgery

## 2013-06-21 DIAGNOSIS — Z96659 Presence of unspecified artificial knee joint: Secondary | ICD-10-CM

## 2013-06-21 DIAGNOSIS — Z880 Allergy status to penicillin: Secondary | ICD-10-CM

## 2013-06-21 DIAGNOSIS — Z8249 Family history of ischemic heart disease and other diseases of the circulatory system: Secondary | ICD-10-CM

## 2013-06-21 DIAGNOSIS — M1712 Unilateral primary osteoarthritis, left knee: Secondary | ICD-10-CM | POA: Diagnosis present

## 2013-06-21 DIAGNOSIS — Z79899 Other long term (current) drug therapy: Secondary | ICD-10-CM

## 2013-06-21 DIAGNOSIS — Z6841 Body Mass Index (BMI) 40.0 and over, adult: Secondary | ICD-10-CM

## 2013-06-21 DIAGNOSIS — Z87891 Personal history of nicotine dependence: Secondary | ICD-10-CM

## 2013-06-21 DIAGNOSIS — M1710 Unilateral primary osteoarthritis, unspecified knee: Principal | ICD-10-CM | POA: Diagnosis present

## 2013-06-21 DIAGNOSIS — I10 Essential (primary) hypertension: Secondary | ICD-10-CM | POA: Diagnosis present

## 2013-06-21 DIAGNOSIS — E7800 Pure hypercholesterolemia, unspecified: Secondary | ICD-10-CM | POA: Diagnosis present

## 2013-06-21 DIAGNOSIS — E119 Type 2 diabetes mellitus without complications: Secondary | ICD-10-CM | POA: Diagnosis present

## 2013-06-21 DIAGNOSIS — J45909 Unspecified asthma, uncomplicated: Secondary | ICD-10-CM | POA: Diagnosis present

## 2013-06-21 DIAGNOSIS — K219 Gastro-esophageal reflux disease without esophagitis: Secondary | ICD-10-CM | POA: Diagnosis present

## 2013-06-21 DIAGNOSIS — Z7982 Long term (current) use of aspirin: Secondary | ICD-10-CM

## 2013-06-21 HISTORY — PX: TOTAL KNEE ARTHROPLASTY: SHX125

## 2013-06-21 LAB — GLUCOSE, CAPILLARY
Glucose-Capillary: 122 mg/dL — ABNORMAL HIGH (ref 70–99)
Glucose-Capillary: 128 mg/dL — ABNORMAL HIGH (ref 70–99)
Glucose-Capillary: 195 mg/dL — ABNORMAL HIGH (ref 70–99)
Glucose-Capillary: 188 mg/dL — ABNORMAL HIGH (ref 70–99)
Glucose-Capillary: 204 mg/dL — ABNORMAL HIGH (ref 70–99)

## 2013-06-21 LAB — HEMOGLOBIN A1C
Hgb A1c MFr Bld: 6.8 % — ABNORMAL HIGH (ref <5.7)
Mean Plasma Glucose: 148 mg/dL — ABNORMAL HIGH (ref <117)

## 2013-06-21 SURGERY — ARTHROPLASTY, KNEE, TOTAL
Anesthesia: Regional | Site: Knee | Laterality: Left | Wound class: Clean

## 2013-06-21 MED ORDER — ONDANSETRON HCL 4 MG/2ML IJ SOLN
INTRAMUSCULAR | Status: DC | PRN
  Administered 2013-06-21: 4 mg via INTRAVENOUS

## 2013-06-21 MED ORDER — FENTANYL CITRATE 0.05 MG/ML IJ SOLN
25.0000 ug | INTRAMUSCULAR | Status: DC | PRN
  Administered 2013-06-21: 25 ug via INTRAVENOUS
  Administered 2013-06-21 (×2): 50 ug via INTRAVENOUS
  Administered 2013-06-21: 25 ug via INTRAVENOUS

## 2013-06-21 MED ORDER — GLYCOPYRROLATE 0.2 MG/ML IJ SOLN
INTRAMUSCULAR | Status: DC | PRN
  Administered 2013-06-21: .6 mg via INTRAVENOUS

## 2013-06-21 MED ORDER — ASPIRIN EC 325 MG PO TBEC
325.0000 mg | DELAYED_RELEASE_TABLET | Freq: Every day | ORAL | Status: DC
  Administered 2013-06-22: 325 mg via ORAL
  Filled 2013-06-21 (×2): qty 1

## 2013-06-21 MED ORDER — PROPOFOL 10 MG/ML IV BOLUS
INTRAVENOUS | Status: DC | PRN
  Administered 2013-06-21: 200 mg via INTRAVENOUS

## 2013-06-21 MED ORDER — BETAMETHASONE DIPROPIONATE 0.05 % EX CREA: 1.0000 "application " | TOPICAL_CREAM | Freq: Two times a day (BID) | CUTANEOUS | Status: DC

## 2013-06-21 MED ORDER — HYDROMORPHONE HCL PF 1 MG/ML IJ SOLN
1.0000 mg | INTRAMUSCULAR | Status: DC | PRN
  Administered 2013-06-21 (×2): 1 mg via INTRAVENOUS
  Filled 2013-06-21: qty 1

## 2013-06-21 MED ORDER — BUPIVACAINE-EPINEPHRINE 0.25% -1:200000 IJ SOLN
INTRAMUSCULAR | Status: DC | PRN
  Administered 2013-06-21: 30 mL

## 2013-06-21 MED ORDER — POVIDONE-IODINE 7.5 % EX SOLN: Freq: Once | CUTANEOUS | Status: DC

## 2013-06-21 MED ORDER — ONDANSETRON HCL 4 MG/2ML IJ SOLN: 4.0000 mg | Freq: Four times a day (QID) | INTRAMUSCULAR | Status: DC | PRN

## 2013-06-21 MED ORDER — DEXAMETHASONE SODIUM PHOSPHATE 10 MG/ML IJ SOLN
10.0000 mg | Freq: Three times a day (TID) | INTRAMUSCULAR | Status: AC
  Administered 2013-06-21: 10 mg via INTRAVENOUS
  Filled 2013-06-21 (×3): qty 1

## 2013-06-21 MED ORDER — SACCHAROMYCES BOULARDII 250 MG PO CAPS
250.0000 mg | ORAL_CAPSULE | Freq: Two times a day (BID) | ORAL | Status: DC
  Administered 2013-06-21 – 2013-06-22 (×3): 250 mg via ORAL
  Filled 2013-06-21 (×4): qty 1

## 2013-06-21 MED ORDER — TRIAMCINOLONE ACETONIDE 0.5 % EX CREA
TOPICAL_CREAM | Freq: Two times a day (BID) | CUTANEOUS | Status: DC
  Administered 2013-06-21: 22:00:00 via TOPICAL
  Administered 2013-06-22: 1 via TOPICAL
  Filled 2013-06-21: qty 15

## 2013-06-21 MED ORDER — MIDAZOLAM HCL 5 MG/5ML IJ SOLN
INTRAMUSCULAR | Status: DC | PRN
  Administered 2013-06-21: 2 mg via INTRAVENOUS

## 2013-06-21 MED ORDER — SODIUM CHLORIDE 0.9 % IR SOLN
Status: DC | PRN
  Administered 2013-06-21: 1000 mL

## 2013-06-21 MED ORDER — ALIGN 4 MG PO CAPS: 4.0000 mg | ORAL_CAPSULE | Freq: Every day | ORAL | Status: DC

## 2013-06-21 MED ORDER — LACTATED RINGERS IV SOLN: INTRAVENOUS | Status: DC

## 2013-06-21 MED ORDER — MAGNESIUM OXIDE 400 (241.3 MG) MG PO TABS
400.0000 mg | ORAL_TABLET | Freq: Every day | ORAL | Status: DC
  Administered 2013-06-21 – 2013-06-22 (×2): 400 mg via ORAL
  Filled 2013-06-21 (×2): qty 1

## 2013-06-21 MED ORDER — ACETAMINOPHEN 325 MG PO TABS: 650.0000 mg | ORAL_TABLET | Freq: Four times a day (QID) | ORAL | Status: DC | PRN

## 2013-06-21 MED ORDER — VALSARTAN-HYDROCHLOROTHIAZIDE 320-25 MG PO TABS: 1.0000 | ORAL_TABLET | Freq: Every day | ORAL | Status: DC

## 2013-06-21 MED ORDER — METFORMIN HCL 500 MG PO TABS: 250.0000 mg | ORAL_TABLET | Freq: Every day | ORAL | Status: DC

## 2013-06-21 MED ORDER — MENTHOL 3 MG MT LOZG: 1.0000 | LOZENGE | OROMUCOSAL | Status: DC | PRN

## 2013-06-21 MED ORDER — PHENOL 1.4 % MT LIQD: 1.0000 | OROMUCOSAL | Status: DC | PRN

## 2013-06-21 MED ORDER — ADULT MULTIVITAMIN W/MINERALS CH
1.0000 | ORAL_TABLET | Freq: Every day | ORAL | Status: DC
  Administered 2013-06-22: 1 via ORAL
  Filled 2013-06-21: qty 1

## 2013-06-21 MED ORDER — OXYCODONE HCL 5 MG/5ML PO SOLN: 5.0000 mg | Freq: Once | ORAL | Status: AC | PRN

## 2013-06-21 MED ORDER — ACETAMINOPHEN 650 MG RE SUPP: 650.0000 mg | Freq: Four times a day (QID) | RECTAL | Status: DC | PRN

## 2013-06-21 MED ORDER — ALBUTEROL SULFATE HFA 108 (90 BASE) MCG/ACT IN AERS
1.0000 | INHALATION_SPRAY | Freq: Four times a day (QID) | RESPIRATORY_TRACT | Status: DC | PRN
  Filled 2013-06-21: qty 6.7

## 2013-06-21 MED ORDER — BISACODYL 5 MG PO TBEC
10.0000 mg | DELAYED_RELEASE_TABLET | Freq: Every day | ORAL | Status: DC
  Administered 2013-06-21: 10 mg via ORAL
  Filled 2013-06-21: qty 2

## 2013-06-21 MED ORDER — ONDANSETRON HCL 4 MG PO TABS: 4.0000 mg | ORAL_TABLET | Freq: Four times a day (QID) | ORAL | Status: DC | PRN

## 2013-06-21 MED ORDER — CHLORHEXIDINE GLUCONATE 4 % EX LIQD: 60.0000 mL | Freq: Once | CUTANEOUS | Status: DC

## 2013-06-21 MED ORDER — BUPIVACAINE-EPINEPHRINE (PF) 0.25% -1:200000 IJ SOLN
INTRAMUSCULAR | Status: AC
  Filled 2013-06-21: qty 30

## 2013-06-21 MED ORDER — OXYCODONE HCL 5 MG PO TABS
5.0000 mg | ORAL_TABLET | Freq: Once | ORAL | Status: AC | PRN
  Administered 2013-06-21: 5 mg via ORAL

## 2013-06-21 MED ORDER — LORATADINE 10 MG PO TABS
10.0000 mg | ORAL_TABLET | Freq: Every day | ORAL | Status: DC
  Administered 2013-06-22: 10 mg via ORAL
  Filled 2013-06-21: qty 1

## 2013-06-21 MED ORDER — VANCOMYCIN HCL IN DEXTROSE 1-5 GM/200ML-% IV SOLN
1000.0000 mg | Freq: Two times a day (BID) | INTRAVENOUS | Status: AC
  Administered 2013-06-21: 1000 mg via INTRAVENOUS
  Filled 2013-06-21: qty 200

## 2013-06-21 MED ORDER — FENTANYL CITRATE 0.05 MG/ML IJ SOLN
INTRAMUSCULAR | Status: AC
  Filled 2013-06-21: qty 2

## 2013-06-21 MED ORDER — OXYCODONE HCL 5 MG PO TABS
ORAL_TABLET | ORAL | Status: AC
  Filled 2013-06-21: qty 3

## 2013-06-21 MED ORDER — DEXAMETHASONE SODIUM PHOSPHATE 10 MG/ML IJ SOLN
INTRAMUSCULAR | Status: DC | PRN
  Administered 2013-06-21: 10 mg via INTRAVENOUS

## 2013-06-21 MED ORDER — MAGNESIUM 400 MG PO CAPS: 400.0000 mg | ORAL_CAPSULE | Freq: Every day | ORAL | Status: DC

## 2013-06-21 MED ORDER — DOCUSATE SODIUM 100 MG PO CAPS
100.0000 mg | ORAL_CAPSULE | Freq: Two times a day (BID) | ORAL | Status: DC
  Administered 2013-06-21 – 2013-06-22 (×3): 100 mg via ORAL
  Filled 2013-06-21 (×3): qty 1

## 2013-06-21 MED ORDER — FUROSEMIDE 20 MG PO TABS: 20.0000 mg | ORAL_TABLET | ORAL | Status: DC

## 2013-06-21 MED ORDER — PANTOPRAZOLE SODIUM 40 MG PO PACK
40.0000 mg | PACK | Freq: Every day | ORAL | Status: DC
  Administered 2013-06-22: 40 mg via ORAL
  Filled 2013-06-21 (×2): qty 20

## 2013-06-21 MED ORDER — MOMETASONE FURO-FORMOTEROL FUM 100-5 MCG/ACT IN AERO
2.0000 | INHALATION_SPRAY | Freq: Two times a day (BID) | RESPIRATORY_TRACT | Status: DC
  Filled 2013-06-21: qty 8.8

## 2013-06-21 MED ORDER — INSULIN ASPART 100 UNIT/ML ~~LOC~~ SOLN
0.0000 [IU] | Freq: Every day | SUBCUTANEOUS | Status: DC
  Administered 2013-06-22: 2 [IU] via SUBCUTANEOUS

## 2013-06-21 MED ORDER — ALUM & MAG HYDROXIDE-SIMETH 200-200-20 MG/5ML PO SUSP: 30.0000 mL | ORAL | Status: DC | PRN

## 2013-06-21 MED ORDER — BUPIVACAINE-EPINEPHRINE PF 0.5-1:200000 % IJ SOLN
INTRAMUSCULAR | Status: DC | PRN
  Administered 2013-06-21: 30 mL

## 2013-06-21 MED ORDER — MULTI-VITAMIN/MINERALS PO TABS: 2.0000 | ORAL_TABLET | Freq: Every day | ORAL | Status: DC

## 2013-06-21 MED ORDER — LACTATED RINGERS IV SOLN
INTRAVENOUS | Status: DC | PRN
  Administered 2013-06-21 (×2): via INTRAVENOUS

## 2013-06-21 MED ORDER — CELECOXIB 200 MG PO CAPS
ORAL_CAPSULE | ORAL | Status: AC
  Filled 2013-06-21: qty 1

## 2013-06-21 MED ORDER — CELECOXIB 200 MG PO CAPS
200.0000 mg | ORAL_CAPSULE | Freq: Two times a day (BID) | ORAL | Status: DC
  Administered 2013-06-21 – 2013-06-22 (×3): 200 mg via ORAL
  Filled 2013-06-21 (×4): qty 1

## 2013-06-21 MED ORDER — INSULIN ASPART 100 UNIT/ML ~~LOC~~ SOLN
0.0000 [IU] | Freq: Three times a day (TID) | SUBCUTANEOUS | Status: DC
  Administered 2013-06-21: 3 [IU] via SUBCUTANEOUS
  Administered 2013-06-22: 2 [IU] via SUBCUTANEOUS

## 2013-06-21 MED ORDER — POTASSIUM CHLORIDE IN NACL 20-0.9 MEQ/L-% IV SOLN
INTRAVENOUS | Status: DC
  Administered 2013-06-21: 16:00:00 via INTRAVENOUS
  Filled 2013-06-21 (×4): qty 1000

## 2013-06-21 MED ORDER — METOCLOPRAMIDE HCL 10 MG PO TABS: 5.0000 mg | ORAL_TABLET | Freq: Three times a day (TID) | ORAL | Status: DC | PRN

## 2013-06-21 MED ORDER — LIDOCAINE HCL (CARDIAC) 20 MG/ML IV SOLN
INTRAVENOUS | Status: DC | PRN
  Administered 2013-06-21: 100 mg via INTRAVENOUS

## 2013-06-21 MED ORDER — HYDROCHLOROTHIAZIDE 25 MG PO TABS
25.0000 mg | ORAL_TABLET | Freq: Every day | ORAL | Status: DC
  Administered 2013-06-21 – 2013-06-22 (×2): 25 mg via ORAL
  Filled 2013-06-21 (×2): qty 1

## 2013-06-21 MED ORDER — VITAMIN D3 25 MCG (1000 UNIT) PO TABS
3000.0000 [IU] | ORAL_TABLET | Freq: Every day | ORAL | Status: DC
  Administered 2013-06-22: 3000 [IU] via ORAL
  Filled 2013-06-21: qty 3

## 2013-06-21 MED ORDER — FENTANYL CITRATE 0.05 MG/ML IJ SOLN
INTRAMUSCULAR | Status: DC | PRN
  Administered 2013-06-21 (×2): 50 ug via INTRAVENOUS
  Administered 2013-06-21: 100 ug via INTRAVENOUS
  Administered 2013-06-21 (×3): 50 ug via INTRAVENOUS

## 2013-06-21 MED ORDER — ZOLPIDEM TARTRATE 5 MG PO TABS: 5.0000 mg | ORAL_TABLET | Freq: Every evening | ORAL | Status: DC | PRN

## 2013-06-21 MED ORDER — DIPHENHYDRAMINE HCL 12.5 MG/5ML PO ELIX: 12.5000 mg | ORAL_SOLUTION | ORAL | Status: DC | PRN

## 2013-06-21 MED ORDER — DEXAMETHASONE 6 MG PO TABS
10.0000 mg | ORAL_TABLET | Freq: Three times a day (TID) | ORAL | Status: AC
  Administered 2013-06-21 – 2013-06-22 (×2): 10 mg via ORAL
  Filled 2013-06-21 (×3): qty 1

## 2013-06-21 MED ORDER — HYDROMORPHONE HCL PF 1 MG/ML IJ SOLN
INTRAMUSCULAR | Status: AC
  Filled 2013-06-21: qty 1

## 2013-06-21 MED ORDER — ARTIFICIAL TEARS OP OINT
TOPICAL_OINTMENT | OPHTHALMIC | Status: DC | PRN
  Administered 2013-06-21: 1 via OPHTHALMIC

## 2013-06-21 MED ORDER — VITAMIN D3 75 MCG (3000 UT) PO TABS: 9000.0000 [IU] | ORAL_TABLET | Freq: Every day | ORAL | Status: DC

## 2013-06-21 MED ORDER — IRBESARTAN 300 MG PO TABS
300.0000 mg | ORAL_TABLET | Freq: Every day | ORAL | Status: DC
  Administered 2013-06-21 – 2013-06-22 (×2): 300 mg via ORAL
  Filled 2013-06-21 (×2): qty 1

## 2013-06-21 MED ORDER — METOCLOPRAMIDE HCL 5 MG/ML IJ SOLN: 5.0000 mg | Freq: Three times a day (TID) | INTRAMUSCULAR | Status: DC | PRN

## 2013-06-21 MED ORDER — OXYCODONE HCL 5 MG PO TABS
5.0000 mg | ORAL_TABLET | ORAL | Status: DC | PRN
  Administered 2013-06-21 (×3): 10 mg via ORAL
  Administered 2013-06-21: 5 mg via ORAL
  Administered 2013-06-22 (×2): 10 mg via ORAL
  Filled 2013-06-21 (×5): qty 2

## 2013-06-21 MED ORDER — ROCURONIUM BROMIDE 100 MG/10ML IV SOLN
INTRAVENOUS | Status: DC | PRN
  Administered 2013-06-21: 50 mg via INTRAVENOUS

## 2013-06-21 MED ORDER — NEOSTIGMINE METHYLSULFATE 1 MG/ML IJ SOLN
INTRAMUSCULAR | Status: DC | PRN
  Administered 2013-06-21: 4 mg via INTRAVENOUS

## 2013-06-21 SURGICAL SUPPLY — 69 items
BANDAGE ESMARK 6X9 LF (GAUZE/BANDAGES/DRESSINGS) ×1 IMPLANT
BLADE SAGITTAL 25.0X1.19X90 (BLADE) ×2 IMPLANT
BLADE SAW SGTL 11.0X1.19X90.0M (BLADE) IMPLANT
BLADE SAW SGTL 13.0X1.19X90.0M (BLADE) ×2 IMPLANT
BLADE SURG 10 STRL SS (BLADE) ×4 IMPLANT
BNDG ELASTIC 6X15 VLCR STRL LF (GAUZE/BANDAGES/DRESSINGS) ×2 IMPLANT
BNDG ESMARK 6X9 LF (GAUZE/BANDAGES/DRESSINGS) ×2
BOWL SMART MIX CTS (DISPOSABLE) ×2 IMPLANT
CAPT RP KNEE ×2 IMPLANT
CEMENT HV SMART SET (Cement) ×4 IMPLANT
CLOTH BEACON ORANGE TIMEOUT ST (SAFETY) ×2 IMPLANT
CLSR STERI-STRIP ANTIMIC 1/2X4 (GAUZE/BANDAGES/DRESSINGS) ×4 IMPLANT
COVER SURGICAL LIGHT HANDLE (MISCELLANEOUS) ×2 IMPLANT
CUFF TOURNIQUET SINGLE 34IN LL (TOURNIQUET CUFF) IMPLANT
CUFF TOURNIQUET SINGLE 44IN (TOURNIQUET CUFF) ×2 IMPLANT
DRAPE EXTREMITY T 121X128X90 (DRAPE) ×2 IMPLANT
DRAPE INCISE IOBAN 66X45 STRL (DRAPES) ×2 IMPLANT
DRAPE PROXIMA HALF (DRAPES) ×2 IMPLANT
DRAPE U-SHAPE 47X51 STRL (DRAPES) ×2 IMPLANT
DRSG ADAPTIC 3X8 NADH LF (GAUZE/BANDAGES/DRESSINGS) ×2 IMPLANT
DRSG PAD ABDOMINAL 8X10 ST (GAUZE/BANDAGES/DRESSINGS) ×4 IMPLANT
DURAPREP 26ML APPLICATOR (WOUND CARE) ×4 IMPLANT
ELECT CAUTERY BLADE 6.4 (BLADE) ×2 IMPLANT
ELECT REM PT RETURN 9FT ADLT (ELECTROSURGICAL) ×2
ELECTRODE REM PT RTRN 9FT ADLT (ELECTROSURGICAL) ×1 IMPLANT
EVACUATOR 1/8 PVC DRAIN (DRAIN) ×2 IMPLANT
FACESHIELD LNG OPTICON STERILE (SAFETY) ×4 IMPLANT
GLOVE BIO SURGEON STRL SZ7 (GLOVE) ×4 IMPLANT
GLOVE BIOGEL PI IND STRL 6.5 (GLOVE) ×1 IMPLANT
GLOVE BIOGEL PI IND STRL 7.0 (GLOVE) ×1 IMPLANT
GLOVE BIOGEL PI IND STRL 7.5 (GLOVE) ×2 IMPLANT
GLOVE BIOGEL PI INDICATOR 6.5 (GLOVE) ×1
GLOVE BIOGEL PI INDICATOR 7.0 (GLOVE) ×1
GLOVE BIOGEL PI INDICATOR 7.5 (GLOVE) ×2
GLOVE SS BIOGEL STRL SZ 6.5 (GLOVE) ×1 IMPLANT
GLOVE SS BIOGEL STRL SZ 7.5 (GLOVE) ×2 IMPLANT
GLOVE SUPERSENSE BIOGEL SZ 6.5 (GLOVE) ×1
GLOVE SUPERSENSE BIOGEL SZ 7.5 (GLOVE) ×2
GOWN PREVENTION PLUS XLARGE (GOWN DISPOSABLE) ×4 IMPLANT
GOWN STRL NON-REIN LRG LVL3 (GOWN DISPOSABLE) ×2 IMPLANT
GOWN STRL REIN XL XLG (GOWN DISPOSABLE) ×2 IMPLANT
HANDPIECE INTERPULSE COAX TIP (DISPOSABLE) ×1
HOOD PEEL AWAY FACE SHEILD DIS (HOOD) ×4 IMPLANT
IMMOBILIZER KNEE 22 UNIV (SOFTGOODS) ×2 IMPLANT
KIT BASIN OR (CUSTOM PROCEDURE TRAY) ×2 IMPLANT
KIT ROOM TURNOVER OR (KITS) ×2 IMPLANT
MANIFOLD NEPTUNE II (INSTRUMENTS) ×2 IMPLANT
NS IRRIG 1000ML POUR BTL (IV SOLUTION) ×2 IMPLANT
PACK TOTAL JOINT (CUSTOM PROCEDURE TRAY) ×2 IMPLANT
PAD ARMBOARD 7.5X6 YLW CONV (MISCELLANEOUS) ×2 IMPLANT
PAD CAST 4YDX4 CTTN HI CHSV (CAST SUPPLIES) ×1 IMPLANT
PADDING CAST COTTON 4X4 STRL (CAST SUPPLIES) ×1
PADDING CAST COTTON 6X4 STRL (CAST SUPPLIES) ×2 IMPLANT
RUBBERBAND STERILE (MISCELLANEOUS) ×2 IMPLANT
SET HNDPC FAN SPRY TIP SCT (DISPOSABLE) ×1 IMPLANT
SPONGE GAUZE 4X4 12PLY (GAUZE/BANDAGES/DRESSINGS) ×2 IMPLANT
STRIP CLOSURE SKIN 1/2X4 (GAUZE/BANDAGES/DRESSINGS) ×2 IMPLANT
SUCTION FRAZIER TIP 10 FR DISP (SUCTIONS) ×2 IMPLANT
SUT ETHIBOND NAB CT1 #1 30IN (SUTURE) ×4 IMPLANT
SUT MNCRL AB 3-0 PS2 18 (SUTURE) ×2 IMPLANT
SUT VIC AB 0 CT1 27 (SUTURE) ×2
SUT VIC AB 0 CT1 27XBRD ANBCTR (SUTURE) ×2 IMPLANT
SUT VIC AB 2-0 CT1 27 (SUTURE) ×2
SUT VIC AB 2-0 CT1 TAPERPNT 27 (SUTURE) ×2 IMPLANT
SYR 30ML SLIP (SYRINGE) ×2 IMPLANT
TOWEL OR 17X24 6PK STRL BLUE (TOWEL DISPOSABLE) ×2 IMPLANT
TOWEL OR 17X26 10 PK STRL BLUE (TOWEL DISPOSABLE) ×2 IMPLANT
TRAY FOLEY CATH 16FR SILVER (SET/KITS/TRAYS/PACK) ×2 IMPLANT
WATER STERILE IRR 1000ML POUR (IV SOLUTION) ×2 IMPLANT

## 2013-06-21 NOTE — Anesthesia Procedure Notes (Addendum)
Anesthesia Regional Block:  Adductor canal block  Pre-Anesthetic Checklist: ,, timeout performed, Correct Patient, Correct Site, Correct Laterality, Correct Procedure, Correct Position, site marked, Risks and benefits discussed,  Surgical consent,  Pre-op evaluation,  At surgeon's request and post-op pain management  Laterality: Left  Prep: chloraprep       Needles:  Injection technique: Single-shot  Needle Type: Echogenic Needle      Needle Gauge: 21 and 21 G    Additional Needles:  Procedures: ultrasound guided (picture in chart) Adductor canal block Narrative:  Start time: 06/21/2013 7:06 AM End time: 06/21/2013 7:14 AM Injection made incrementally with aspirations every 5 mL.  Performed by: Personally  Anesthesiologist: Dr Chaney Malling   Procedure Name: Intubation Date/Time: 06/21/2013 7:22 AM Performed by: Lanell Matar Pre-anesthesia Checklist: Patient identified, Timeout performed, Emergency Drugs available, Suction available and Patient being monitored Patient Re-evaluated:Patient Re-evaluated prior to inductionOxygen Delivery Method: Circle system utilized Preoxygenation: Pre-oxygenation with 100% oxygen Intubation Type: IV induction Ventilation: Mask ventilation without difficulty and Oral airway inserted - appropriate to patient size Laryngoscope Size: Hyacinth Meeker and 2 Grade View: Grade II Tube type: Oral Tube size: 7.0 mm Number of attempts: 2 Airway Equipment and Method: Bougie stylet and Stylet Placement Confirmation: ETT inserted through vocal cords under direct vision,  positive ETCO2,  CO2 detector and breath sounds checked- equal and bilateral Secured at: 22 cm Tube secured with: Tape Dental Injury: Injury to lip  Comments: 1st DL with Mac 3 by paramedic student. ETT in esophagus. Immediately recognized. Tube removed. Small injury to lip. DL with Hyacinth Meeker 2 by CRNA. Grade 2 view. ETT passed easily over bougie stylet. ETCO2+, BBS=. O2 sat 100%

## 2013-06-21 NOTE — Care Management Note (Signed)
CM spoke with patient concerning Home Health and equipment needs at discharge. Patient preoperatively setup with [facility], no changes.Patient will need bariatric 3in1, states she has a walker at home. She has family support at discharge. Will followup with [facility] concerning CPM and 3in1. Vance Peper, RN BSN

## 2013-06-21 NOTE — Anesthesia Preprocedure Evaluation (Addendum)
Anesthesia Evaluation  Patient identified by MRN, date of birth, ID band Patient awake    Reviewed: Allergy & Precautions, H&P , NPO status , Patient's Chart, lab work & pertinent test results  Airway Mallampati: II TM Distance: >3 FB Neck ROM: full    Dental  (+) Teeth Intact and Dental Advisory Given   Pulmonary shortness of breath, asthma , former smoker,          Cardiovascular hypertension,     Neuro/Psych    GI/Hepatic GERD-  ,  Endo/Other  diabetes, Type obesity  Renal/GU      Musculoskeletal   Abdominal   Peds  Hematology   Anesthesia Other Findings   Reproductive/Obstetrics                          Anesthesia Physical Anesthesia Plan  ASA: II  Anesthesia Plan: General and Regional   Post-op Pain Management: MAC Combined w/ Regional for Post-op pain   Induction: Intravenous  Airway Management Planned: Oral ETT  Additional Equipment:   Intra-op Plan:   Post-operative Plan: Extubation in OR  Informed Consent: I have reviewed the patients History and Physical, chart, labs and discussed the procedure including the risks, benefits and alternatives for the proposed anesthesia with the patient or authorized representative who has indicated his/her understanding and acceptance.     Plan Discussed with: CRNA, Anesthesiologist and Surgeon  Anesthesia Plan Comments:         Anesthesia Quick Evaluation

## 2013-06-21 NOTE — Care Management Note (Signed)
CARE MANAGEMENT NOTE 06/21/2013  Patient:  AVALEIGH, DECUIR   Account Number:  [id number]  Date Initiated:  06/21/2013  Documentation initiated by:  Vance Peper  Subjective/Objective Assessment:   36 yr.old female s/p left total knee arthroplasty.     Action/Plan:   CM spoke with patient and husband concerning HH and DME needs. Choice offered. Patient preoperatively setup with [facility], bariactric 3in1 and CPM to be provided by [facility]. Spoke with Kipp Brood.   Anticipated DC Date:  06/22/2013   Anticipated DC Plan:  HOME W HOME HEALTH SERVICES      DC Planning Services  CM consult      PAC Choice  DURABLE MEDICAL EQUIPMENT  HOME HEALTH   Choice offered to / List presented to:  C-1 Patient   DME arranged  3-N-1  CPM      DME agency  [facility] [employer]     HH arranged  HH-2 PT      HH agency  [facility]   Status of service:  Completed, signed off Medicare Important Message given?   (If response is "NO", the following Medicare IM given date fields will be blank) Date Medicare IM given:   Date Additional Medicare IM given:    Discharge Disposition:  HOME W HOME HEALTH SERVICES  Per UR Regulation:

## 2013-06-21 NOTE — Plan of Care (Signed)
Problem: Consults Goal: Diagnosis- Total Joint Replacement Primary Total Knee Left     

## 2013-06-21 NOTE — Anesthesia Postprocedure Evaluation (Signed)
Anesthesia Post Note  Patient: Breanna Ross  Procedure(s) Performed: Procedure(s) (LRB): TOTAL KNEE ARTHROPLASTY- left (Left)  Anesthesia type: General  Patient location: PACU  Post pain: Pain level controlled and Adequate analgesia  Post assessment: Post-op Vital signs reviewed, Patient's Cardiovascular Status Stable, Respiratory Function Stable, Patent Airway and Pain level controlled  Last Vitals:  Filed Vitals:   06/21/13 1020  BP:   Pulse: 98  Temp:   Resp: 14    Post vital signs: Reviewed and stable  Level of consciousness: awake, alert  and oriented  Complications: No apparent anesthesia complications

## 2013-06-21 NOTE — Evaluation (Signed)
Physical Therapy Evaluation Patient Details Name: NATHAN STALLWORTH MRN: [id number] DOB: 1951-07-09 Today's Date: 06/21/2013 Time: 7846-9629 PT Time Calculation (min): 24 min  PT Assessment / Plan / Recommendation History of Present Illness  Patient is a 62 yo female s/p Lt TKA.  Clinical Impression  Patient presents with problems listed below.  Will benefit from acute PT to maximize independence prior to discharge home with husband.      PT Assessment  Patient needs continued PT services    Follow Up Recommendations  Home health PT;Supervision/Assistance - 24 hour    Does the patient have the potential to tolerate intense rehabilitation      Barriers to Discharge        Equipment Recommendations  3in1 (PT) (Bariatric 3-in-1 BSC)    Recommendations for Other Services     Frequency 7X/week    Precautions / Restrictions Precautions Precautions: Knee Precaution Booklet Issued: Yes (comment) Precaution Comments: Reviewed precautions with patient and her husband. Required Braces or Orthoses: Knee Immobilizer - Left Knee Immobilizer - Left: On when out of bed or walking Restrictions Weight Bearing Restrictions: Yes LLE Weight Bearing: Weight bearing as tolerated   Pertinent Vitals/Pain       Mobility  Bed Mobility Bed Mobility: Supine to Sit;Sitting - Scoot to Edge of Bed Supine to Sit: 4: Min assist;With rails;HOB elevated Sitting - Scoot to Edge of Bed: 4: Min guard;With rail Details for Bed Mobility Assistance: Instructed patient on donning KI on LLE.  Verbal cues for technique for mobility.  Assist to move LLE off of bed.  In sitting, patient with good balance. Sat EOB x 7 minutes due to nausea. Transfers Transfers: Sit to Stand;Stand to Sit;Stand Pivot Transfers Sit to Stand: 4: Min assist;From elevated surface;With upper extremity assist;From bed Stand to Sit: 4: Min assist;With upper extremity assist;With armrests;To chair/3-in-1 Stand Pivot Transfers: 4: Min  assist Details for Transfer Assistance: Verbal cues for hand placement and technique.  Assist to rise to standing and for balance initially.  Verbal cues for safe use of RW.  Patient able to take several steps to pivot to chair.  Assist to control descent to chair. Ambulation/Gait Ambulation/Gait Assistance: Not tested (comment)    Exercises Total Joint Exercises Ankle Circles/Pumps: AROM;Both;10 reps;Seated   PT Diagnosis: Difficulty walking;Acute pain  PT Problem List: Decreased strength;Decreased range of motion;Decreased activity tolerance;Decreased balance;Decreased mobility;Decreased knowledge of use of DME;Decreased knowledge of precautions;Pain PT Treatment Interventions: DME instruction;Gait training;Stair training;Functional mobility training;Therapeutic exercise;Patient/family education     PT Goals(Current goals can be found in the care plan section) Acute Rehab PT Goals Patient Stated Goal: To feel better PT Goal Formulation: With patient/family Time For Goal Achievement: 06/28/13 Potential to Achieve Goals: Good  Visit Information  Last PT Received On: 06/21/13 Assistance Needed: +1 History of Present Illness: Patient is a 62 yo female s/p Lt TKA.       Prior Functioning  Home Living Family/patient expects to be discharged to:: Private residence Living Arrangements: Spouse/significant other Available Help at Discharge: Family;Available 24 hours/day Type of Home: House Home Access: Stairs to enter [employer] of Steps: 3 Entrance Stairs-Rails: None Home Layout: One level Home Equipment: Walker - 2 wheels Prior Function Level of Independence: Independent Communication Communication: No difficulties    Cognition  Cognition Arousal/Alertness: Lethargic;Suspect due to medications Behavior During Therapy: [facility] for tasks assessed/performed Overall Cognitive Status: Within Functional Limits for tasks assessed    Extremity/Trunk Assessment Upper  Extremity Assessment Upper Extremity Assessment: Overall [facility]  for tasks assessed Lower Extremity Assessment Lower Extremity Assessment: LLE deficits/detail LLE Deficits / Details: Decreased sterngth and ROM due to surgery/pain.  Patient able to assist with moving LLE off of bed. LLE: Unable to fully assess due to pain   Balance Balance Balance Assessed: Yes Static Sitting Balance Static Sitting - Balance Support: No upper extremity supported;Feet supported Static Sitting - Level of Assistance: 5: Stand by assistance Static Sitting - Comment/# of Minutes: 7 minutes with good balance. Static Standing Balance Static Standing - Balance Support: Bilateral upper extremity supported Static Standing - Level of Assistance: 4: Min assist Static Standing - Comment/# of Minutes: 2 minutes, initially with posterior lean.  Improved over time.  End of Session PT - End of Session Equipment Utilized During Treatment: Gait belt;Left knee immobilizer;Oxygen Activity Tolerance: Patient limited by pain;Patient limited by fatigue Patient left: in chair;with call bell/phone within reach Nurse Communication: Mobility status CPM Left Knee CPM Left Knee: Off  GP     Vena Austria 06/21/2013, 3:24 PM Durenda Hurt Renaldo Fiddler, [facility] Acute Rehab Services Pager [phone]

## 2013-06-21 NOTE — H&P (View-Only) (Signed)
TOTAL KNEE ADMISSION H&P  Patient is being admitted for left total knee arthroplasty.  Subjective:  Chief Complaint:left knee pain.  HPI: Breanna Ross, 62 y.o. female, has a history of pain and functional disability in the left knee due to arthritis and has failed non-surgical conservative treatments for greater than 12 weeks to includeNSAID's and/or analgesics, corticosteriod injections, viscosupplementation injections, flexibility and strengthening excercises, supervised PT with diminished ADL's post treatment, use of assistive devices, weight reduction as appropriate and activity modification.  Onset of symptoms was gradual, starting >10 years ago with gradually worsening course since that time. The patient noted prior procedures on the knee to include  arthroscopy and menisectomy on the left knee(s).  Patient currently rates pain in the left knee(s) at 10 out of 10 with activity. Patient has night pain, worsening of pain with activity and weight bearing, pain that interferes with activities of daily living, crepitus and joint swelling.  Patient has evidence of subchondral sclerosis, periarticular osteophytes and joint space narrowing by imaging studies. There is no active infection.  Patient Active Problem List   Diagnosis Date Noted  . Hypertension   . GERD (gastroesophageal reflux disease)   . Diabetes    Past Medical History  Diagnosis Date  . Hypertension   . GERD (gastroesophageal reflux disease)   . Left knee DJD   . Hypercholesteremia   . Diabetes     Past Surgical History  Procedure Laterality Date  . Breast surgery    . Hemorrhoid surgery    . Knee arthroscopy w/ meniscectomy Left 09-29-09     (Not in a hospital admission) Allergies  Allergen Reactions  . Penicillins     History  Substance Use Topics  . Smoking status: Former Smoker    Quit date: 06/08/2006  . Smokeless tobacco: Not on file  . Alcohol Use: No    Family History  Problem Relation Age of Onset   . Hypertension Mother   . Hypertension Father      Review of Systems  Constitutional: Negative.   HENT: Negative.   Eyes: Negative.   Respiratory: Negative.   Cardiovascular: Negative.   Gastrointestinal: Negative.   Genitourinary: Negative.   Musculoskeletal: Positive for joint pain.       Bilateral knee pain left worse than right  Skin: Negative.   Neurological: Negative.     Objective:  Physical Exam  Constitutional: She is oriented to person, place, and time. She appears well-developed and well-nourished.  HENT:  Head: Normocephalic.  Eyes: Conjunctivae are normal. Pupils are equal, round, and reactive to light.  Neck: Normal range of motion. Neck supple.  Cardiovascular: Normal rate and regular rhythm.  Exam reveals no gallop and no friction rub.   No murmur heard. Respiratory: Breath sounds normal.  GI: Soft. Bowel sounds are normal.  Genitourinary:  Not pertinent to current symptomatology therefore not examined.  Musculoskeletal:  On examination she is a well developed well nourished 62 year old female. She ambulates with a moderate antalgic gait. No cane today She has active range of motion -5 to 120 degrees bilaterally 2+ crepitus bilaterally 2+ synovitis bilaterally medial joint line tenderness bilaterally distal neurologic function is intact.  Left is more painful than right.  Neurological: She is alert and oriented to person, place, and time.  Skin: Skin is warm and dry.  Psychiatric: She has a normal mood and affect. Her behavior is normal.    Vital signs in last 24 hours: Last recorded: 11/18 0900  BP: 139/98    Temp: 98.1 F (36.7 C)    Height: 5\' 5"  (1.651 m) SpO2: 96  Weight: 135.172 kg (298 lb)     Labs:   Estimated body mass index is 49.59 kg/(m^2) as calculated from the following:   Height as of this encounter: 5\' 5"  (1.651 m).   Weight as of this encounter: 135.172 kg (298 lb).   Imaging Review Plain radiographs demonstrate severe  degenerative joint disease of the bilaterally knee(s). Left worse than right   The overall alignment issignificant varus. The bone quality appears to be good for age and reported activity level.  Assessment/Plan:  End stage arthritis, left knee  Patient Active Problem List   Diagnosis Date Noted  . Hypertension   . GERD (gastroesophageal reflux disease)   . Diabetes     The patient history, physical examination, clinical judgment of the provider and imaging studies are consistent with end stage degenerative joint disease of the left knee(s) and total knee arthroplasty is deemed medically necessary. The treatment options including medical management, injection therapy arthroscopy and arthroplasty were discussed at length. The risks and benefits of total knee arthroplasty were presented and reviewed. The risks due to aseptic loosening, infection, stiffness, patella tracking problems, thromboembolic complications and other imponderables were discussed. The patient acknowledged the explanation, agreed to proceed with the plan and consent was signed. Patient is being admitted for inpatient treatment for surgery, pain control, PT, OT, prophylactic antibiotics, VTE prophylaxis, progressive ambulation and ADL's and discharge planning. The patient is planning to be discharged home with home health services  Fumi Guadron A. Gwinda Passe Physician Assistant [facility] [phone]  06/08/2013, 11:24 AM

## 2013-06-21 NOTE — Interval H&P Note (Signed)
History and Physical Interval Note:  06/21/2013 7:04 AM  Breanna Ross  has presented today for surgery, with the diagnosis of DJD LEFT KNEE  The various methods of treatment have been discussed with the patient and family. After consideration of risks, benefits and other options for treatment, the patient has consented to  Procedure(s): TOTAL KNEE ARTHROPLASTY (Left) as a surgical intervention .  The patient's history has been reviewed, patient examined, no change in status, stable for surgery.  I have reviewed the patient's chart and labs.  Questions were answered to the patient's satisfaction.     Salvatore Marvel A

## 2013-06-21 NOTE — Op Note (Signed)
MRN:     [id number] DOB/AGE:    [birth year] / 62 y.o.       OPERATIVE REPORT    DATE OF PROCEDURE:  06/21/2013       PREOPERATIVE DIAGNOSIS:   DJD LEFT KNEE      There is no weight on file to calculate BMI.                                                        POSTOPERATIVE DIAGNOSIS:   DJD LEFT KNEE                                                                      PROCEDURE:  Procedure(s): TOTAL KNEE ARTHROPLASTY- left Using Depuy Sigma RP implants #2.5 Femur, #3Tibia, 12.66mm sigma RP bearing, 32 Patella     SURGEON: Yalonda Sample A    ASSISTANT:  Kirstin Shepperson PA-C   (Present and scrubbed throughout the case, critical for assistance with exposure, retraction, instrumentation, and closure.)         ANESTHESIA: GET with Femoral Nerve Block  DRAINS: foley, 2 medium hemovac in knee   TOURNIQUET TIME:   COMPLICATIONS:  None     SPECIMENS: None   INDICATIONS FOR PROCEDURE: The patient has  DJD LEFT KNEE, varus deformities, XR shows bone on bone arthritis. Patient has failed all conservative measures including anti-inflammatory medicines, narcotics, attempts at  exercise and weight loss, cortisone injections and viscosupplementation.  Risks and benefits of surgery have been discussed, questions answered.   DESCRIPTION OF PROCEDURE: The patient identified by armband, received  right femoral nerve block and IV antibiotics, in the holding area at [facility]. Patient taken to the operating room, appropriate anesthetic  monitors were attached General endotracheal anesthesia induced with  the patient in supine position, Foley catheter was inserted. Tourniquet  applied high to the operative thigh. Lateral post and foot positioner  applied to the table, the lower extremity was then prepped and draped  in usual sterile fashion from the ankle to the tourniquet. Time-out procedure was performed. The limb was wrapped with an Esmarch bandage and the tourniquet inflated to 365  mmHg. We began the operation by making the anterior midline incision starting at handbreadth above the patella going over the patella 1 cm medial to and  4 cm distal to the tibial tubercle. Small bleeders in the skin and the  subcutaneous tissue identified and cauterized. Transverse retinaculum was incised and reflected medially and a medial parapatellar arthrotomy was accomplished. the patella was everted and theprepatellar fat pad resected. The superficial medial collateral  ligament was then elevated from anterior to posterior along the proximal  flare of the tibia and anterior half of the menisci resected. The knee was hyperflexed exposing bone on bone arthritis. Peripheral and notch osteophytes as well as the cruciate ligaments were then resected. We continued to  work our way around posteriorly along the proximal tibia, and externally  rotated the tibia subluxing it out from underneath the femur. A [employer]ale  retractor was placed through the notch and a lateral [facility]  retractor  placed, and we then drilled through the proximal tibia in line with the  axis of the tibia followed by an intramedullary guide rod and 2-degree  posterior slope cutting guide. The tibial cutting guide was pinned into place  allowing resection of 4 mm of bone medially and about 6 mm of bone  laterally because of her varus deformity. Satisfied with the tibial resection, we then  entered the distal femur 2 mm anterior to the PCL origin with the  intramedullary guide rod and applied the distal femoral cutting guide  set at 11mm, with 5 degrees of valgus. This was pinned along the  epicondylar axis. At this point, the distal femoral cut was accomplished without difficulty. We then sized for a #2.5 femoral component and pinned the guide in 3 degrees of external rotation.The chamfer cutting guide was pinned into place. The anterior, posterior, and chamfer cuts were accomplished without difficulty followed by  the Sigma RP box  cutting guide and the box cut. We also removed posterior osteophytes from the posterior femoral condyles. At this  time, the knee was brought into full extension. We checked our  extension and flexion gaps and found them symmetric at 12.7mm.  The patella thickness measured at 23 mm. We set the cutting guide at 14 and removed the posterior 9.5-10 mm  of the patella sized for 32 button and drilled the lollipop. The knee  was then once again hyperflexed exposing the proximal tibia. We sized for a #3 tibial base plate, applied the smokestack and the conical reamer followed by the the Delta fin keel punch. We then hammered into place the Sigma RP trial femoral component, inserted a 12.5-mm trial bearing, trial patellar button, and took the knee through range of motion from 0-130 degrees. No thumb pressure was required for patellar  tracking. At this point, all trial components were removed, a double batch of DePuy HV cement  was mixed and applied to all bony metallic mating surfaces except for the posterior condyles of the femur itself. In order, we  hammered into place the tibial tray and removed excess cement, the femoral component and removed excess cement, a 12.5-mm Sigma RP bearing  was inserted, and the knee brought to full extension with compression.  The patellar button was clamped into place, and excess cement  removed. While the cement cured the wound was irrigated out with normal saline solution pulse lavage, and medium Hemovac drains were placed.. Ligament stability and patellar tracking were checked and found to be excellent. The tourniquet was then released and hemostasis was obtained with cautery. The parapatellar arthrotomy was closed with  #1 ethibond suture. The subcutaneous tissue with 0 and 2-0 undyed  Vicryl suture, and 4-0 Monocryl.. A dressing of Xeroform,  4 x 4, dressing sponges, Webril, and Ace wrap applied. Needle and sponge count were correct times 2.The patient awakened,  extubated, and taken to recovery room without difficulty. Vascular status was normal, pulses 2+ and symmetric.   Jacqualine Weichel A 06/21/2013, 8:54 AM

## 2013-06-21 NOTE — Transfer of Care (Signed)
Immediate Anesthesia Transfer of Care Note  Patient: Breanna Ross  Procedure(s) Performed: Procedure(s): TOTAL KNEE ARTHROPLASTY- left (Left)  Patient Location: PACU  Anesthesia Type:GA combined with regional for post-op pain  Level of Consciousness: awake, alert  and oriented  Airway & Oxygen Therapy: Patient Spontanous Breathing and Patient connected to face mask oxygen  Post-op Assessment: Report given to PACU RN and Post -op Vital signs reviewed and stable  Post vital signs: Reviewed and stable  Complications: No apparent anesthesia complications

## 2013-06-21 NOTE — Progress Notes (Signed)
UR review completed. 

## 2013-06-21 NOTE — Progress Notes (Signed)
Orthopedic Tech Progress Note Patient Details:  Breanna Ross September 01, 1950 [id number] CPM applied to Left LE with appropriate settings. OHF applied to bed. Footsie roll provided.  CPM Left Knee CPM Left Knee: On Left Knee Flexion (Degrees): 60 Left Knee Extension (Degrees): 0   Asia R Thompson 06/21/2013, 10:44 AM

## 2013-06-22 LAB — BASIC METABOLIC PANEL
BUN: 13 mg/dL (ref 6–23)
CO2: 27 mEq/L (ref 19–32)
Calcium: 8.8 mg/dL (ref 8.4–10.5)
Chloride: 99 mEq/L (ref 96–112)
Creatinine, Ser: 0.67 mg/dL (ref 0.50–1.10)
GFR calc Af Amer: >90 mL/min (ref >90)
GFR calc non Af Amer: >90 mL/min (ref >90)
Glucose, Bld: 153 mg/dL — ABNORMAL HIGH (ref 70–99)
Potassium: 4 mEq/L (ref 3.5–5.1)
Sodium: 137 mEq/L (ref 135–145)

## 2013-06-22 LAB — CBC
HCT: 37.8 % (ref 36.0–46.0)
Hemoglobin: 12.5 g/dL (ref 12.0–15.0)
MCH: 29 pg (ref 26.0–34.0)
MCHC: 33.1 g/dL (ref 30.0–36.0)
MCV: 87.7 fL (ref 78.0–100.0)
Platelets: 290 10*3/uL (ref 150–400)
RBC: 4.31 MIL/uL (ref 3.87–5.11)
RDW: 14.3 % (ref 11.5–15.5)
WBC: 13.6 10*3/uL — ABNORMAL HIGH (ref 4.0–10.5)

## 2013-06-22 LAB — GLUCOSE, CAPILLARY
Glucose-Capillary: 147 mg/dL — ABNORMAL HIGH (ref 70–99)
Glucose-Capillary: 176 mg/dL — ABNORMAL HIGH (ref 70–99)

## 2013-06-22 MED ORDER — ASPIRIN 325 MG PO TBEC: DELAYED_RELEASE_TABLET | ORAL | Status: DC

## 2013-06-22 MED ORDER — DSS 100 MG PO CAPS: ORAL_CAPSULE | ORAL | Status: DC

## 2013-06-22 MED ORDER — ACETAMINOPHEN 325 MG PO TABS: 650.0000 mg | ORAL_TABLET | Freq: Four times a day (QID) | ORAL | Status: DC | PRN

## 2013-06-22 MED ORDER — OXYCODONE HCL 5 MG PO TABS: ORAL_TABLET | ORAL | Status: DC

## 2013-06-22 MED ORDER — BISACODYL 5 MG PO TBEC: DELAYED_RELEASE_TABLET | ORAL | Status: DC

## 2013-06-22 NOTE — Evaluation (Signed)
Occupational Therapy Evaluation and Discharge Summary Patient Details Name: [name] MRN: [id number] DOB: [birth year] Today's Date: 06/22/2013 Time: 4098-1191 OT Time Calculation (min): 19 min  OT Assessment / Plan / Recommendation History of present illness Patient is a 62 yo female s/p Lt TKA.   Clinical Impression   Pt admitted with above diagnosis and is doing very well with adls only needing assist donning L sock and shoe.  Pt will not need further OT at this time.  All ADL techniques reviewed.    OT Assessment  Patient does not need any further OT services    Follow Up Recommendations  No OT follow up    Barriers to Discharge      Equipment Recommendations  3 in 1 bedside comode    Recommendations for Other Services    Frequency       Precautions / Restrictions Precautions Precautions: Knee Precaution Comments: Reviewed precautions with patient and her husband. Required Braces or Orthoses: Knee Immobilizer - Left Knee Immobilizer - Left: On when out of bed or walking Restrictions Weight Bearing Restrictions: Yes LLE Weight Bearing: Weight bearing as tolerated   Pertinent Vitals/Pain Pt with 4/10 pain in knee.  All other vitals stable.   ADL  Eating/Feeding: Performed;Independent Where Assessed - Eating/Feeding: Chair Grooming: Performed;Wash/dry hands;Teeth care;Supervision/safety Where Assessed - Grooming: Unsupported standing Upper Body Bathing: Performed;Set up Where Assessed - Upper Body Bathing: Unsupported sitting Lower Body Bathing: Performed;Min guard Where Assessed - Lower Body Bathing: Unsupported sit to stand Upper Body Dressing: Performed;Set up Where Assessed - Upper Body Dressing: Unsupported sitting Lower Body Dressing: Performed;Minimal assistance Where Assessed - Lower Body Dressing: Unsupported sit to stand Toilet Transfer: Performed;Min guard Toilet Transfer Method: Stand pivot Acupuncturist: Comfort height toilet;Grab  bars Toileting - Architect and Hygiene: Performed;Supervision/safety Where Assessed - Engineer, mining and Hygiene: Standing Equipment Used: Rolling walker Transfers/Ambulation Related to ADLs: Pt walked in room with S and walker. ADL Comments: Pt does very well with adls only needing assist to donn and doff L sock and shoe.  Husband available to assist.    OT Diagnosis:    OT Problem List:   OT Treatment Interventions:     OT Goals(Current goals can be found in the care plan section) Acute Rehab OT Goals Patient Stated Goal: to be independent at home. OT Goal Formulation: With patient  Visit Information  Last OT Received On: 06/22/13 Assistance Needed: +1 History of Present Illness: Patient is a 62 yo female s/p Lt TKA.       Prior Functioning     Home Living Family/patient expects to be discharged to:: Private residence Living Arrangements: Spouse/significant other Available Help at Discharge: Family;Available 24 hours/day Type of Home: House Home Access: Stairs to enter [employer] of Steps: 3 Entrance Stairs-Rails: None Home Layout: One level Home Equipment: Walker - 2 wheels;Shower seat - built in Additional Comments: needs BSC Prior Function Level of Independence: Independent Communication Communication: No difficulties Dominant Hand: Right         Vision/Perception Vision - History Baseline Vision: No visual deficits Patient Visual Report: No change from baseline Vision - Assessment Vision Assessment: Vision not tested Perception Perception: Within Functional Limits Praxis Praxis: Intact   Cognition  Cognition Arousal/Alertness: Awake/alert Behavior During Therapy: WFL for tasks assessed/performed Overall Cognitive Status: Within Functional Limits for tasks assessed    Extremity/Trunk Assessment Upper Extremity Assessment Upper Extremity Assessment: Overall WFL for tasks assessed Lower Extremity  Assessment Lower Extremity Assessment:  Defer to PT evaluation Cervical / Trunk Assessment Cervical / Trunk Assessment: Normal     Mobility Bed Mobility Bed Mobility: Not assessed Supine to Sit: 6: Modified independent (Device/Increase time);HOB flat Sitting - Scoot to Edge of Bed: 6: Modified independent (Device/Increase time) Transfers Transfers: Sit to Stand;Stand to Sit Sit to Stand: 5: Supervision Stand to Sit: 5: Supervision Details for Transfer Assistance: Pt safe with transfers today.  Occasional cues for hand placement.     Exercise Total Joint Exercises Ankle Circles/Pumps: AROM;Both;10 reps Straight Leg Raises: AROM;Strengthening;Right;10 reps   Balance Balance Balance Assessed: Yes Static Sitting Balance Static Sitting - Balance Support: No upper extremity supported;Feet supported Static Sitting - Level of Assistance: 5: Stand by assistance Static Standing Balance Static Standing - Balance Support: Bilateral upper extremity supported Static Standing - Level of Assistance: 5: Stand by assistance   End of Session OT - End of Session Equipment Utilized During Treatment: Rolling walker Activity Tolerance: Patient tolerated treatment well Patient left: in chair;with call bell/phone within reach;with family/visitor present Nurse Communication: Mobility status CPM Left Knee CPM Left Knee: Off  GO     Hope Budds 06/22/2013, 10:06 AM [phone]

## 2013-06-22 NOTE — Progress Notes (Signed)
Pt discharged to home. D/c instructions given. No questions verbalized. Vitals stable. 

## 2013-06-22 NOTE — Progress Notes (Signed)
Physical Therapy Treatment Patient Details Name: Breanna Ross MRN: [id number] DOB: 09/29/1950 Today's Date: 06/22/2013 Time: 0800-0827 PT Time Calculation (min): 27 min  PT Assessment / Plan / Recommendation  History of Present Illness Patient is a 62 yo female s/p Lt TKA.   PT Comments   Pt progressing very well with mobility.  Ambulated from [employer] & completed stair training this session.   Follow Up Recommendations  Home health PT;Supervision/Assistance - 24 hour     Does the patient have the potential to tolerate intense rehabilitation     Barriers to Discharge        Equipment Recommendations  3in1 (PT)    Recommendations for Other Services    Frequency 7X/week   Progress towards PT Goals Progress towards PT goals: Progressing toward goals  Plan Current plan remains appropriate    Precautions / Restrictions Precautions Precautions: Knee Precaution Comments: Reviewed precautions with patient and her husband. Required Braces or Orthoses: Knee Immobilizer - Left Knee Immobilizer - Left: On when out of bed or walking Restrictions LLE Weight Bearing: Weight bearing as tolerated   Pertinent Vitals/Pain "It's ok".  Did not rate.      Mobility  Bed Mobility Bed Mobility: Supine to Sit;Sitting - Scoot to Edge of Bed Supine to Sit: 6: Modified independent (Device/Increase time);HOB flat Sitting - Scoot to Edge of Bed: 6: Modified independent (Device/Increase time) Transfers Transfers: Sit to Stand;Stand to Sit Sit to Stand: 4: Min guard;With upper extremity assist;From bed Stand to Sit: 4: Min guard;With upper extremity assist;With armrests;To chair/3-in-1 Details for Transfer Assistance: cues for hand placement & use of UE's to control descent Ambulation/Gait Ambulation/Gait Assistance: 4: Min guard Ambulation Distance (Feet): 150 Feet Assistive device: Rolling walker Ambulation/Gait Assistance Details: cues for sequencing & safe use of RW.    Gait Pattern:  Step-through pattern;Decreased stride length Gait velocity: decreased General Gait Details: Ortho PA present for entire duration of gait training-- PA wanted to trial ambulation without KI.  No knee buckling noted.   Stairs: Yes Stairs Assistance: 4: Min assist Stairs Assistance Details (indicate cue type and reason): (A) for RW management.  Cues for sequencing & technique.  Pt's husband assisted 2nd trial Stair Management Technique: No rails;Step to pattern;Backwards;With walker Number of Stairs: 2 (2x's) Wheelchair Mobility Wheelchair Mobility: No    Exercises Total Joint Exercises Ankle Circles/Pumps: AROM;Both;10 reps Straight Leg Raises: AROM;Strengthening;Right;10 reps     PT Goals (current goals can now be found in the care plan section) Acute Rehab PT Goals PT Goal Formulation: With patient/family Time For Goal Achievement: 06/28/13 Potential to Achieve Goals: Good  Visit Information  Last PT Received On: 06/22/13 Assistance Needed: +1 History of Present Illness: Patient is a 62 yo female s/p Lt TKA.    Subjective Data      Cognition  Cognition Arousal/Alertness: Awake/alert Behavior During Therapy: WFL for tasks assessed/performed Overall Cognitive Status: Within Functional Limits for tasks assessed    Balance     End of Session PT - End of Session Activity Tolerance: Patient tolerated treatment well Patient left: in chair;with call bell/phone within reach;with family/visitor present Nurse Communication: Mobility status   GP     Lara Mulch 06/22/2013, 8:33 AM   Verdell Face, PTA [phone] 06/22/2013

## 2013-06-22 NOTE — Discharge Summary (Signed)
Patient ID: Breanna Ross MRN: [id number] DOB/AGE: 12-26-1950 62 y.o.  Admit date: 06/21/2013 Discharge date: 06/22/2013  Admission Diagnoses:  Principal Problem:   Left knee DJD Active Problems:   Hypertension   GERD (gastroesophageal reflux disease)   Diabetes   DJD (degenerative joint disease) of knee   Discharge Diagnoses:  Same  Past Medical History  Diagnosis Date  . Hypertension   . GERD (gastroesophageal reflux disease)   . Hypercholesteremia   . Shortness of breath     with exertion  . Asthma   . Left knee DJD     BIlateral Knees  . Diabetes     type 2    Surgeries: Procedure(s): TOTAL KNEE ARTHROPLASTY- left on 06/21/2013   Consultants:    Discharged Condition: Improved  Hospital Course: Breanna Ross is an 62 y.o. female who was admitted 06/21/2013 for operative treatment ofLeft knee DJD. Patient has severe unremitting pain that affects sleep, daily activities, and work/hobbies. After pre-op clearance the patient was taken to the operating room on 06/21/2013 and underwent  Procedure(s): TOTAL KNEE ARTHROPLASTY- left.    Patient was given perioperative antibiotics: Anti-infectives   Start     Dose/Rate Route Frequency Ordered Stop   06/21/13 1900  vancomycin (VANCOCIN) IVPB 1000 mg/200 mL premix     1,000 mg 200 mL/hr over 60 Minutes Intravenous Every 12 hours 06/21/13 1123 06/21/13 1939   06/21/13 0600  vancomycin (VANCOCIN) 1,500 mg in sodium chloride 0.9 % 500 mL IVPB     1,500 mg 250 mL/hr over 120 Minutes Intravenous On call to O.R. 06/20/13 1442 06/21/13 0725       Patient was given sequential compression devices, early ambulation, and chemoprophylaxis to prevent DVT.  Patient benefited maximally from hospital stay and there were no complications.    Recent vital signs: Patient Vitals for the past 24 hrs:  BP Temp Pulse Resp SpO2  06/22/13 0739 - - - 18 99 %  06/22/13 0626 125/76 mmHg 98.2 F (36.8 C) 94 18 98 %  06/21/13 2111 121/78  mmHg 97.3 F (36.3 C) 99 18 95 %  06/21/13 1528 - - - 18 -  06/21/13 1516 140/80 mmHg 98.2 F (36.8 C) 108 18 95 %  06/21/13 1200 - - - 18 -  06/21/13 1100 147/77 mmHg 98 F (36.7 C) 105 16 95 %  06/21/13 1045 - - 100 11 96 %  06/21/13 1044 160/91 mmHg - 101 21 95 %  06/21/13 1030 - - 95 11 91 %  06/21/13 1020 - - 98 14 96 %  06/21/13 1015 156/98 mmHg - 92 11 98 %  06/21/13 1000 - - 95 16 88 %  06/21/13 0945 - 97.6 F (36.4 C) - 14 -     Recent laboratory studies:  Recent Labs  06/22/13 0510  WBC 13.6*  HGB 12.5  HCT 37.8  PLT 290  NA 137  K 4.0  CL 99  CO2 27  BUN 13  CREATININE 0.67  GLUCOSE 153*  CALCIUM 8.8     Discharge Medications:     Medication List    STOP taking these medications       Co Q 10 100 MG Caps     Evening Primrose Oil 500 MG Caps      TAKE these medications       acetaminophen 325 MG tablet  Commonly known as:  TYLENOL  Take 2 tablets (650 mg total) by mouth every 6 (six) hours as  needed for mild pain (or Fever >/= 101).     albuterol 108 (90 BASE) MCG/ACT inhaler  Commonly known as:  PROVENTIL HFA;VENTOLIN HFA  Inhale 1 puff into the lungs every 6 (six) hours as needed for wheezing or shortness of breath.     ALIGN 4 MG Caps  Take 4 mg by mouth daily.     aspirin 325 MG EC tablet  1 tab a day for the next 30 days to prevent blood clots     betamethasone dipropionate 0.05 % cream  Commonly known as:  DIPROLENE  Apply 1 application topically 2 (two) times daily.     bisacodyl 5 MG EC tablet  Commonly known as:  DULCOLAX  Take 2 tablets every night with dinner until bowel movement.  LAXITIVE.  Restart if two days since last bowel movement     CALCIUM + D3 PO  Take 1 tablet by mouth 2 (two) times daily.     celecoxib 200 MG capsule  Commonly known as:  CELEBREX  Take 200 mg by mouth daily.     cetirizine 10 MG tablet  Commonly known as:  ZYRTEC  Take 10 mg by mouth daily.     cholecalciferol 1000 UNITS tablet   Commonly known as:  VITAMIN D  Take 3,000 Units by mouth daily.     CLOBETASOL PROPIONATE E 0.05 % emollient cream  Generic drug:  Clobetasol Prop Emollient Base  Apply 1 application topically 2 (two) times daily.     DEEP BLUE RELIEF Gel  Apply 1 application topically 3 (three) times daily as needed (for knee pain).     DSS 100 MG Caps  1 tab 2 times a day while on narcotics.  STOOL SOFTENER     esomeprazole 40 MG capsule  Commonly known as:  NEXIUM  Take 40 mg by mouth daily before breakfast.     furosemide 20 MG tablet  Commonly known as:  LASIX  Take 20 mg by mouth every other day.     Magnesium 400 MG Caps  Take 400 mg by mouth daily.     metFORMIN 500 MG tablet  Commonly known as:  GLUCOPHAGE  Take 250 mg by mouth daily.     mometasone-formoterol 100-5 MCG/ACT Aero  Commonly known as:  DULERA  Inhale 2 puffs into the lungs 2 (two) times daily.     multivitamin with minerals tablet  Take 2 tablets by mouth daily. Alive Gummys     omega-3 acid ethyl esters 1 G capsule  Commonly known as:  LOVAZA  Take 1 g by mouth daily.     oxyCODONE 5 MG immediate release tablet  Commonly known as:  Oxy IR/ROXICODONE  1-2 tablets every 4-6 hrs as needed for pain     PRESCRIPTION MEDICATION  - Apply 1-2 Squirts topically See admin instructions. Dicl/Bacl/Bupl/Dmsol/Gaba/Ibu/Pend (3/2/1/4/6/3/3/%)  - 1-2 pumps applied to knee 3-4 times a day for knee pain     valsartan-hydrochlorothiazide 320-25 MG per tablet  Commonly known as:  DIOVAN-HCT  Take 1 tablet by mouth daily.     vitamin E 400 UNIT capsule  Take 400 Units by mouth daily.        Diagnostic Studies: Dg Chest 2 View  06/11/2013   CLINICAL DATA:  Preop total knee arthroplasty  EXAM: CHEST  2 VIEW  COMPARISON:  05/24/2011  FINDINGS: Increased interstitial markings, likely chronic. No focal consolidation. No pleural effusion or pneumothorax.  The heart is normal in size.  Mild  degenerative changes of the  visualized thoracolumbar spine.  IMPRESSION: No evidence of acute cardiopulmonary disease.   Electronically Signed   By: Charline Bills M.D.   On: 06/11/2013 12:37    Disposition: 01-Home or Self Care      Discharge Orders   Future Orders Complete By Expires   Call MD / Call 911  As directed    Comments:     If you experience chest pain or shortness of breath, CALL 911 and be transported to the hospital emergency room.  If you develope a fever above 101 F, pus (white drainage) or increased drainage or redness at the wound, or calf pain, call your surgeon's office.   Change dressing  As directed    Comments:     Change the dressing daily with sterile 4 x 4 inch gauze dressing and apply TED hose.  You may clean the incision with alcohol prior to redressing.   Constipation Prevention  As directed    Comments:     Drink plenty of fluids.  Prune juice may be helpful.  You may use a stool softener, such as Colace (over the counter) 100 mg twice a day.  Use MiraLax (over the counter) for constipation as needed.   CPM  As directed    Comments:     Continuous passive motion machine (CPM):      Use the CPM from 0 to 90 for 6 hours per day.       You may break it up into 2 or 3 sessions per day.      Use CPM for 2 weeks or until you are told to stop.   Diet - low sodium heart healthy  As directed    Discharge instructions  As directed    Comments:     Total Knee Replacement Care After Refer to this sheet in the next few weeks. These discharge instructions provide you with general information on caring for yourself after you leave the hospital. Your caregiver may also give you specific instructions. Your treatment has been planned according to the most current medical practices available, but unavoidable complications sometimes occur. If you have any problems or questions after discharge, please call your caregiver. Regaining a near full range of motion of your knee within the first 3 to 6 weeks  after surgery is critical. HOME CARE INSTRUCTIONS  You may resume a normal diet and activities as directed.  Perform exercises as directed.  Place gray foam block, curve side up under heel at all times except when in CPM or when walking.  DO NOT modify, tear, cut, or change in any way the gray foam block. You will receive physical therapy daily  Take showers instead of baths until informed otherwise.  You may shower on Sunday.  Please wash whole leg including wound with soap and water  Change bandages (dressings)daily It is OK to take over-the-counter tylenol in addition to the oxycodone for pain, discomfort, or fever. Oxycodone is VERY constipating.  Please take stool softener twice a day and laxatives daily until bowels are regular Eat a well-balanced diet.  Avoid lifting or driving until you are instructed otherwise.  Make an appointment to see your caregiver for stitches (suture) or staple removal as directed.  If you have been sent home with a continuous passive motion machine (CPM machine), 0-90 degrees 6 hrs a day   2 hrs a shift SEEK MEDICAL CARE IF: You have swelling of your calf or leg.  You  develop shortness of breath or chest pain.  You have redness, swelling, or increasing pain in the wound.  There is pus or any unusual drainage coming from the surgical site.  You notice a bad smell coming from the surgical site or dressing.  The surgical site breaks open after sutures or staples have been removed.  There is persistent bleeding from the suture or staple line.  You are getting worse or are not improving.  You have any other questions or concerns.  SEEK IMMEDIATE MEDICAL CARE IF:  You have a fever.  You develop a rash.  You have difficulty breathing.  You develop any reaction or side effects to medicines given.  Your knee motion is decreasing rather than improving.  MAKE SURE YOU:  Understand these instructions.  Will watch your condition.  Will get help right away if you  are not doing well or get worse.   Do not put a pillow under the knee. Place it under the heel.  As directed    Comments:     Place gray foam block, curve side up under heel at all times except when in CPM or when walking.  DO NOT modify, tear, cut, or change in any way the gray foam block.   Increase activity slowly as tolerated  As directed    TED hose  As directed    Comments:     Use stockings (TED hose) for 2 weeks on both leg(s).  You may remove them at night for sleeping.      Follow-up Information   Follow up with Nilda Simmer, MD On 07/06/2013. (appt time 9 am)    Specialty:  Orthopedic Surgery   Contact information:   [address] [place] [address] [phone]        Signed: Pascal Lux 06/22/2013, 8:25 AM

## 2013-06-23 ENCOUNTER — Encounter (HOSPITAL_COMMUNITY): Payer: Self-pay | Admitting: Orthopedic Surgery

## 2014-03-07 ENCOUNTER — Other Ambulatory Visit: Payer: Self-pay

## 2014-03-07 DIAGNOSIS — Z1231 Encounter for screening mammogram for malignant neoplasm of breast: Secondary | ICD-10-CM

## 2014-03-23 ENCOUNTER — Encounter (HOSPITAL_COMMUNITY): Payer: Self-pay | Admitting: Physician Assistant

## 2014-03-23 DIAGNOSIS — M1711 Unilateral primary osteoarthritis, right knee: Secondary | ICD-10-CM | POA: Diagnosis present

## 2014-03-23 NOTE — H&P (Signed)
TOTAL KNEE ADMISSION H&P  Patient is being admitted for right total knee arthroplasty.  Subjective:  Chief Complaint:right knee pain.  HPI: Breanna Ross, 63 y.o. female, has a history of pain and functional disability in the right knee due to arthritis and has failed non-surgical conservative treatments for greater than 12 weeks to includeNSAID's and/or analgesics, corticosteriod injections, viscosupplementation injections, flexibility and strengthening excercises, supervised PT with diminished ADL's post treatment, use of assistive devices, weight reduction as appropriate and activity modification.  Onset of symptoms was gradual, starting 10 years ago with gradually worsening course since that time. The patient noted no past surgery on the right knee(s).  Patient currently rates pain in the right knee(s) at 10 out of 10 with activity. Patient has night pain, worsening of pain with activity and weight bearing, pain that interferes with activities of daily living, pain with passive range of motion, crepitus and joint swelling.  Patient has evidence of subchondral sclerosis, joint subluxation and joint space narrowing by imaging studies. There is no active infection.  Patient Active Problem List   Diagnosis Date Noted  . Primary localized osteoarthritis of right knee 03/23/2014  . Left knee DJD 06/21/2013  . DJD (degenerative joint disease) of knee 06/21/2013  . S/P left total knee arthroplasty 06/21/2013  . Hypertension   . GERD (gastroesophageal reflux disease)   . Diabetes    Past Medical History  Diagnosis Date  . Hypertension   . GERD (gastroesophageal reflux disease)   . Hypercholesteremia   . Shortness of breath     with exertion  . Asthma   . Left knee DJD     BIlateral Knees  . Diabetes     type 2  . Primary localized osteoarthritis of right knee     Past Surgical History  Procedure Laterality Date  . Hemorrhoid surgery    . Knee arthroscopy w/ meniscectomy Left  09-29-09  . Rotar cuff Bilateral 2007  . Breast surgery      reduction  . Total knee arthroplasty Left 06/21/2013    Dr Thurston Hole  . Total knee arthroplasty Left 06/21/2013    Procedure: TOTAL KNEE ARTHROPLASTY- left;  Surgeon: Nilda Simmer, MD;  Location: [facility] OR;  Service: Orthopedics;  Laterality: Left;    No prescriptions prior to admission   Allergies  Allergen Reactions  . Penicillins Swelling    History  Substance Use Topics  . Smoking status: Former Smoker -- 30 years    Quit date: 06/08/2006  . Smokeless tobacco: Never Used  . Alcohol Use: No    Family History  Problem Relation Age of Onset  . Hypertension Mother   . Hypertension Father      Review of Systems  Constitutional: Negative.   HENT: Negative.   Eyes: Negative.   Respiratory: Negative.   Cardiovascular: Negative.   Gastrointestinal: Negative.   Genitourinary: Negative.   Musculoskeletal: Positive for joint pain.  Skin: Negative.   Neurological: Negative.   Endo/Heme/Allergies: Negative.   Psychiatric/Behavioral: Negative.     Objective:  Physical Exam  Constitutional: She is oriented to person, place, and time. She appears well-developed and well-nourished.  HENT:  Head: Normocephalic and atraumatic.  Mouth/Throat: Oropharynx is clear and moist.  Eyes: Conjunctivae are normal. Pupils are equal, round, and reactive to light.  Neck: Neck supple.  Cardiovascular: Normal rate, regular rhythm and normal heart sounds.   Respiratory: Effort normal.  GI: Soft.  Genitourinary:  Not pertinent to current symptomatology therefore not examined.  Musculoskeletal:  Examination of her right knee reveals 1+ crepitation.  1+ synovitis.  Range of motion -5 to 125 degrees.  Knee is stable with diffuse pain and normal patella tracking.  Examination of her left knee reveals well healed incisions.  No swelling or pain.  Full range of motion.  Knee is stable.  Vascular exam: Pulses are 2+ and symmetric.     Neurological: She is alert and oriented to person, place, and time.  Skin: Skin is warm and dry.  Psychiatric: She has a normal mood and affect.    Vital signs in last 24 hours:    Labs:   Estimated body mass index is 49.16 kg/(m^2) as calculated from the following:   Height as of 06/22/13:  (1.651 m).   Weight as of 06/11/13: 133.993 kg (295 lb 6.4 oz).   Imaging Review Plain radiographs demonstrate severe degenerative joint disease of the right knee(s). The overall alignment issignificant valgus. The bone quality appears to be good for age and reported activity level.  Assessment/Plan:  End stage arthritis, right knee  Principal Problem:   Primary localized osteoarthritis of right knee Active Problems:   Hypertension   GERD (gastroesophageal reflux disease)   Diabetes   S/P left total knee arthroplasty  The patient history, physical examination, clinical judgment of the provider and imaging studies are consistent with end stage degenerative joint disease of the right knee(s) and total knee arthroplasty is deemed medically necessary. The treatment options including medical management, injection therapy arthroscopy and arthroplasty were discussed at length. The risks and benefits of total knee arthroplasty were presented and reviewed. The risks due to aseptic loosening, infection, stiffness, patella tracking problems, thromboembolic complications and other imponderables were discussed. The patient acknowledged the explanation, agreed to proceed with the plan and consent was signed. Patient is being admitted for inpatient treatment for surgery, pain control, PT, OT, prophylactic antibiotics, VTE prophylaxis, progressive ambulation and ADL's and discharge planning. The patient is planning to be discharged home with home health services   Post operatively we will use MS Contin and hydrocodone for breakthrough pain.  Patient does not tolerate oxycodone.  Ameilia Rattan A. Gwinda Passe Physician Assistant [facility] [phone]  03/23/2014, 4:29 PM

## 2014-04-01 ENCOUNTER — Encounter (HOSPITAL_COMMUNITY)
Admission: RE | Admit: 2014-04-01 | Discharge: 2014-04-01 | Disposition: A | Discharge status: Home / Self Care | Payer: 59 | Source: Ambulatory Visit | Attending: Orthopedic Surgery | Admitting: Orthopedic Surgery

## 2014-04-01 ENCOUNTER — Encounter (INDEPENDENT_AMBULATORY_CARE_PROVIDER_SITE_OTHER): Payer: Self-pay

## 2014-04-01 ENCOUNTER — Ambulatory Visit
Admission: RE | Admit: 2014-04-01 | Discharge: 2014-04-01 | Disposition: A | Discharge status: Home / Self Care | Payer: 59 | Source: Ambulatory Visit

## 2014-04-01 ENCOUNTER — Encounter (HOSPITAL_COMMUNITY): Payer: Self-pay

## 2014-04-01 DIAGNOSIS — M1710 Unilateral primary osteoarthritis, unspecified knee: Secondary | ICD-10-CM | POA: Insufficient documentation

## 2014-04-01 DIAGNOSIS — Z01812 Encounter for preprocedural laboratory examination: Secondary | ICD-10-CM | POA: Diagnosis present

## 2014-04-01 DIAGNOSIS — Z1231 Encounter for screening mammogram for malignant neoplasm of breast: Secondary | ICD-10-CM

## 2014-04-01 HISTORY — DX: Type 2 diabetes mellitus without complications: E11.9

## 2014-04-01 LAB — COMPREHENSIVE METABOLIC PANEL
ALT: 19 U/L (ref 0–35)
AST: 19 U/L (ref 0–37)
Albumin: 3.9 g/dL (ref 3.5–5.2)
Alkaline Phosphatase: 101 U/L (ref 39–117)
Anion gap: 16 — ABNORMAL HIGH (ref 5–15)
BUN: 18 mg/dL (ref 6–23)
CO2: 27 mEq/L (ref 19–32)
Calcium: 9.8 mg/dL (ref 8.4–10.5)
Chloride: 98 mEq/L (ref 96–112)
Creatinine, Ser: 0.84 mg/dL (ref 0.50–1.10)
GFR calc Af Amer: 85 mL/min — ABNORMAL LOW (ref >90)
GFR calc non Af Amer: 73 mL/min — ABNORMAL LOW (ref >90)
Glucose, Bld: 78 mg/dL (ref 70–99)
Potassium: 3.6 mEq/L — ABNORMAL LOW (ref 3.7–5.3)
Sodium: 141 mEq/L (ref 137–147)
Total Bilirubin: 0.3 mg/dL (ref 0.3–1.2)
Total Protein: 8.2 g/dL (ref 6.0–8.3)

## 2014-04-01 LAB — URINALYSIS, ROUTINE W REFLEX MICROSCOPIC
Bilirubin Urine: NEGATIVE
Glucose, UA: NEGATIVE mg/dL
Hgb urine dipstick: NEGATIVE
Ketones, ur: 15 mg/dL — AB
Leukocytes, UA: NEGATIVE
Nitrite: NEGATIVE
Protein, ur: NEGATIVE mg/dL
Specific Gravity, Urine: 1.03 (ref 1.005–1.030)
Urobilinogen, UA: 0.2 mg/dL (ref 0.0–1.0)
pH: 5.5 (ref 5.0–8.0)

## 2014-04-01 LAB — CBC WITH DIFFERENTIAL/PLATELET
Basophils Absolute: 0 10*3/uL (ref 0.0–0.1)
Basophils Relative: 1 % (ref 0–1)
Eosinophils Absolute: 0.2 10*3/uL (ref 0.0–0.7)
Eosinophils Relative: 3 % (ref 0–5)
HCT: 42.1 % (ref 36.0–46.0)
Hemoglobin: 14.1 g/dL (ref 12.0–15.0)
Lymphocytes Relative: 37 % (ref 12–46)
Lymphs Abs: 2.9 10*3/uL (ref 0.7–4.0)
MCH: 28.3 pg (ref 26.0–34.0)
MCHC: 33.5 g/dL (ref 30.0–36.0)
MCV: 84.4 fL (ref 78.0–100.0)
Monocytes Absolute: 0.5 10*3/uL (ref 0.1–1.0)
Monocytes Relative: 6 % (ref 3–12)
Neutro Abs: 4.2 10*3/uL (ref 1.7–7.7)
Neutrophils Relative %: 53 % (ref 43–77)
Platelets: 303 10*3/uL (ref 150–400)
RBC: 4.99 MIL/uL (ref 3.87–5.11)
RDW: 14.1 % (ref 11.5–15.5)
WBC: 7.8 10*3/uL (ref 4.0–10.5)

## 2014-04-01 LAB — PROTIME-INR
INR: 1.02 (ref 0.00–1.49)
Prothrombin Time: 13.4 seconds (ref 11.6–15.2)

## 2014-04-01 LAB — SURGICAL PCR SCREEN
MRSA, PCR: NEGATIVE
Staphylococcus aureus: NEGATIVE

## 2014-04-01 LAB — TYPE AND SCREEN
ABO/RH(D): O POS
Antibody Screen: NEGATIVE

## 2014-04-01 LAB — APTT: aPTT: 34 seconds (ref 24–37)

## 2014-04-01 NOTE — Progress Notes (Signed)
Anesthesia Chart Review:  Patient is a 63 year old female scheduled for right TKA on 04/11/14 by Dr. Thurston Hole.  History includes former smoker, HTN, GERD, hypercholesterolemia, SOB, asthma, DM2, breast reduction, left TKA 06/2013. BMI is consistent with morbid obesity.  PCP is listed as Dr.  [name]. She was referred to cardiologist Dr. Jacinto Halim at [facility] in 05/2013 for a preoperative evaluation for pending staged bilateral TKA. She was ultimately cleared following a non-ischemic stress test (see below).  EKG on 05/28/13 showed: SR at 101 bpm, LAE, poor r wave progression, low voltage QRS, pulmonary disease pattern.   Nuclear stress test on 06/04/13 showed: Normal isotope uptake both at rest and stress. There was no evidence of ischemia or scar.  Dynamic gated images reveal normal wall motion and endocardial thickening.  LVEF estimated at 73%.  Echo on 06/07/13 showed: LV cavity is normal in size. Mild concentric LVH. Normal global wall motion. Normal LV systolic function. EF 57%. LA cavity is borderline dilated. Mild MR/TR.  CXR on 06/11/13 showed: No evidence of acute cardiopulmonary disease.  Preoperative labs noted.  Urine culture is pending. A1C on 06/21/13 was 6.8.   She tolerated similar procedure in 06/2013.  If no acute changes then I would anticipate that she could proceed as planned.  Velna Ochs [facility] Short Stay Center/Anesthesiology Phone [phone] 04/01/2014 2:49 PM

## 2014-04-01 NOTE — Progress Notes (Signed)
04/01/14 1032  OBSTRUCTIVE SLEEP APNEA  Have you ever been diagnosed with sleep apnea through a sleep study? No  Do you snore loudly (loud enough to be heard through closed doors)?  1  Do you often feel tired, fatigued, or sleepy during the daytime? 0  Has anyone observed you stop breathing during your sleep? 0  Do you have, or are you being treated for high blood pressure? 1  BMI more than 35 kg/m2? 1  Age over 62 years old? 1  Neck circumference greater than 40 cm/16 inches? 1  Gender: 0  Obstructive Sleep Apnea Score 5  Score 4 or greater  Results sent to PCP

## 2014-04-01 NOTE — Pre-Procedure Instructions (Signed)
JAQUASHA CARNEVALE  04/01/2014   Your procedure is scheduled on:  September 21  Report to [facility] Admitting at 10:30 AM.  Call this number if you have problems the morning of surgery: [phone]   Remember:   Do not eat food or drink liquids after midnight.   Take these medicines the morning of surgery with A SIP OF WATER: Zyrtec, Nexium, Dulera, ProAir   STOP Lovaza, CoQ10, Magnesium, Align, Prim Rose Oil, Vitamin D, Vitamin E, Multiple Vitamin, Celebrex, Calcium, Deep Blue Rub September 14   STOP/ Do not take Aspirin, Aleve, Naproxen, Advil, Ibuprofen, Motrin, Vitamins, Herbs, or Supplements starting Monday September 14   Do not wear jewelry, make-up or nail polish.  Do not wear lotions, powders, or perfumes. You may wear deodorant.  Do not shave 48 hours prior to surgery. Men may shave face and neck.  Do not bring valuables to the hospital.  [facility] is not responsible for any belongings or valuables.               Contacts, dentures or bridgework may not be worn into surgery.  Leave suitcase in the car. After surgery it may be brought to your room.  For patients admitted to the hospital, discharge time is determined by your treatment team.               Special Instructions: See [facility] Preparing For Surgery   Please read over the following fact sheets that you were given: Pain Booklet, Coughing and Deep Breathing, Blood Transfusion Information and Surgical Site Infection Prevention  [facility] - Preparing for Surgery  Before surgery, you can play an important role.  Because skin is not sterile, your skin needs to be as free of germs as possible.  You can reduce the number of germs on you skin by washing with CHG (chlorahexidine gluconate) soap before surgery.  CHG is an antiseptic cleaner which kills germs and bonds with the skin to continue killing germs even after washing.  Please DO NOT use if you have an allergy to CHG or antibacterial soaps.  If your  skin becomes reddened/irritated stop using the CHG and inform your nurse when you arrive at Short Stay.  Do not shave (including legs and underarms) for at least 48 hours prior to the first CHG shower.  You may shave your face.  Please follow these instructions carefully:   1.  Shower with CHG Soap the night before surgery and the morning of Surgery.  2.  If you choose to wash your hair, wash your hair first as usual with your normal shampoo.  3.  After you shampoo, rinse your hair and body thoroughly to remove the shampoo.  4.  Use CHG as you would any other liquid soap.  You can apply CHG directly to the skin and wash gently with scrungie or a clean washcloth.  5.  Apply the CHG Soap to your body ONLY FROM THE NECK DOWN.  Do not use on open wounds or open sores.  Avoid contact with your eyes, ears, mouth and genitals (private parts).  Wash genitals (private parts) with your normal soap.  6.  Wash thoroughly, paying special attention to the area where your surgery will be performed.  7.  Thoroughly rinse your body with warm water from the neck down.  8.  DO NOT shower/wash with your normal soap after using and rinsing off the CHG Soap.  9.  Pat yourself dry with a  clean towel.            10.  Wear clean pajamas.            11.  Place clean sheets on your bed the night of your first shower and do not sleep with pets.  Day of Surgery  Do not apply any lotions the morning of surgery.  Please wear clean clothes to the hospital/surgery center.

## 2014-04-02 LAB — URINE CULTURE: Colony Count: 8000

## 2014-04-10 MED ORDER — VANCOMYCIN HCL 10 G IV SOLR
1500.0000 mg | INTRAVENOUS | Status: AC
  Administered 2014-04-11: 1500 mg via INTRAVENOUS
  Filled 2014-04-10: qty 1500

## 2014-04-11 ENCOUNTER — Encounter (HOSPITAL_COMMUNITY)
Admission: RE | Disposition: A | Discharge status: Home Health | Payer: Self-pay | Source: Ambulatory Visit | Attending: Orthopedic Surgery

## 2014-04-11 ENCOUNTER — Inpatient Hospital Stay (HOSPITAL_COMMUNITY)
Admission: RE | Admit: 2014-04-11 | Discharge: 2014-04-12 | DRG: 470 | Disposition: A | Discharge status: Home Health | Payer: 59 | Source: Ambulatory Visit | Attending: Orthopedic Surgery | Admitting: Orthopedic Surgery

## 2014-04-11 ENCOUNTER — Inpatient Hospital Stay (HOSPITAL_COMMUNITY): Payer: 59 | Admitting: Certified Registered Nurse Anesthetist

## 2014-04-11 ENCOUNTER — Encounter (HOSPITAL_COMMUNITY): Payer: Self-pay | Admitting: *Deleted

## 2014-04-11 ENCOUNTER — Encounter (HOSPITAL_COMMUNITY): Payer: 59 | Admitting: Vascular Surgery

## 2014-04-11 DIAGNOSIS — G8918 Other acute postprocedural pain: Secondary | ICD-10-CM | POA: Diagnosis not present

## 2014-04-11 DIAGNOSIS — E7800 Pure hypercholesterolemia, unspecified: Secondary | ICD-10-CM | POA: Diagnosis present

## 2014-04-11 DIAGNOSIS — M1710 Unilateral primary osteoarthritis, unspecified knee: Secondary | ICD-10-CM | POA: Diagnosis present

## 2014-04-11 DIAGNOSIS — Z880 Allergy status to penicillin: Secondary | ICD-10-CM | POA: Diagnosis not present

## 2014-04-11 DIAGNOSIS — K219 Gastro-esophageal reflux disease without esophagitis: Secondary | ICD-10-CM | POA: Diagnosis present

## 2014-04-11 DIAGNOSIS — I10 Essential (primary) hypertension: Secondary | ICD-10-CM | POA: Diagnosis present

## 2014-04-11 DIAGNOSIS — E119 Type 2 diabetes mellitus without complications: Secondary | ICD-10-CM | POA: Diagnosis present

## 2014-04-11 DIAGNOSIS — J45909 Unspecified asthma, uncomplicated: Secondary | ICD-10-CM | POA: Diagnosis present

## 2014-04-11 DIAGNOSIS — Z7901 Long term (current) use of anticoagulants: Secondary | ICD-10-CM

## 2014-04-11 DIAGNOSIS — Z8249 Family history of ischemic heart disease and other diseases of the circulatory system: Secondary | ICD-10-CM

## 2014-04-11 DIAGNOSIS — M179 Osteoarthritis of knee, unspecified: Secondary | ICD-10-CM | POA: Diagnosis present

## 2014-04-11 DIAGNOSIS — Z96659 Presence of unspecified artificial knee joint: Secondary | ICD-10-CM

## 2014-04-11 DIAGNOSIS — Z87891 Personal history of nicotine dependence: Secondary | ICD-10-CM

## 2014-04-11 DIAGNOSIS — Z79899 Other long term (current) drug therapy: Secondary | ICD-10-CM

## 2014-04-11 DIAGNOSIS — M25569 Pain in unspecified knee: Secondary | ICD-10-CM | POA: Diagnosis present

## 2014-04-11 DIAGNOSIS — M1711 Unilateral primary osteoarthritis, right knee: Secondary | ICD-10-CM | POA: Diagnosis present

## 2014-04-11 HISTORY — DX: Unilateral primary osteoarthritis, right knee: M17.11

## 2014-04-11 HISTORY — PX: TOTAL KNEE ARTHROPLASTY: SHX125

## 2014-04-11 LAB — GLUCOSE, CAPILLARY
Glucose-Capillary: 137 mg/dL — ABNORMAL HIGH (ref 70–99)
Glucose-Capillary: 117 mg/dL — ABNORMAL HIGH (ref 70–99)
Glucose-Capillary: 294 mg/dL — ABNORMAL HIGH (ref 70–99)
Glucose-Capillary: 226 mg/dL — ABNORMAL HIGH (ref 70–99)

## 2014-04-11 SURGERY — ARTHROPLASTY, KNEE, TOTAL
Anesthesia: General | Site: Knee | Laterality: Right

## 2014-04-11 MED ORDER — GLYCOPYRROLATE 0.2 MG/ML IJ SOLN
INTRAMUSCULAR | Status: AC
  Filled 2014-04-11: qty 3

## 2014-04-11 MED ORDER — POLYVINYL ALCOHOL 1.4 % OP SOLN
1.0000 [drp] | Freq: Every day | OPHTHALMIC | Status: DC
  Administered 2014-04-12: 1 [drp] via OPHTHALMIC
  Filled 2014-04-11: qty 15

## 2014-04-11 MED ORDER — MAGNESIUM OXIDE 400 (241.3 MG) MG PO TABS
400.0000 mg | ORAL_TABLET | Freq: Every day | ORAL | Status: DC
  Administered 2014-04-11 – 2014-04-12 (×2): 400 mg via ORAL
  Filled 2014-04-11 (×2): qty 1

## 2014-04-11 MED ORDER — ONDANSETRON HCL 4 MG/2ML IJ SOLN
INTRAMUSCULAR | Status: AC
  Filled 2014-04-11: qty 2

## 2014-04-11 MED ORDER — ALUM & MAG HYDROXIDE-SIMETH 200-200-20 MG/5ML PO SUSP: 30.0000 mL | ORAL | Status: DC | PRN

## 2014-04-11 MED ORDER — DIPHENHYDRAMINE HCL 12.5 MG/5ML PO ELIX: 12.5000 mg | ORAL_SOLUTION | ORAL | Status: DC | PRN

## 2014-04-11 MED ORDER — BUPIVACAINE HCL 0.5 % IJ SOLN
INTRAMUSCULAR | Status: DC | PRN
  Administered 2014-04-11: 30 mL

## 2014-04-11 MED ORDER — DEXAMETHASONE SODIUM PHOSPHATE 10 MG/ML IJ SOLN
INTRAMUSCULAR | Status: DC | PRN
  Administered 2014-04-11: 10 mg via INTRAVENOUS

## 2014-04-11 MED ORDER — ACETAMINOPHEN 650 MG RE SUPP: 650.0000 mg | Freq: Four times a day (QID) | RECTAL | Status: DC | PRN

## 2014-04-11 MED ORDER — CALCIUM + D3 600-200 MG-UNIT PO TABS: 1.0000 | ORAL_TABLET | Freq: Two times a day (BID) | ORAL | Status: DC

## 2014-04-11 MED ORDER — SODIUM CHLORIDE 0.9 % IR SOLN
Status: DC | PRN
  Administered 2014-04-11: 3000 mL

## 2014-04-11 MED ORDER — SUCCINYLCHOLINE CHLORIDE 20 MG/ML IJ SOLN
INTRAMUSCULAR | Status: DC | PRN
  Administered 2014-04-11: 120 mg via INTRAVENOUS

## 2014-04-11 MED ORDER — CALCIUM CARBONATE-VITAMIN D 500-200 MG-UNIT PO TABS
1.0000 | ORAL_TABLET | Freq: Two times a day (BID) | ORAL | Status: DC
  Administered 2014-04-11 – 2014-04-12 (×3): 1 via ORAL
  Filled 2014-04-11 (×4): qty 1

## 2014-04-11 MED ORDER — MOMETASONE FURO-FORMOTEROL FUM 100-5 MCG/ACT IN AERO
2.0000 | INHALATION_SPRAY | Freq: Two times a day (BID) | RESPIRATORY_TRACT | Status: DC
  Administered 2014-04-11: 2 via RESPIRATORY_TRACT
  Filled 2014-04-11: qty 8.8

## 2014-04-11 MED ORDER — METOCLOPRAMIDE HCL 5 MG PO TABS
5.0000 mg | ORAL_TABLET | Freq: Three times a day (TID) | ORAL | Status: DC | PRN
  Filled 2014-04-11: qty 2

## 2014-04-11 MED ORDER — DOCUSATE SODIUM 100 MG PO CAPS
100.0000 mg | ORAL_CAPSULE | Freq: Two times a day (BID) | ORAL | Status: DC
  Administered 2014-04-11 – 2014-04-12 (×3): 100 mg via ORAL
  Filled 2014-04-11 (×4): qty 1

## 2014-04-11 MED ORDER — MORPHINE SULFATE 2 MG/ML IJ SOLN
INTRAMUSCULAR | Status: AC
  Administered 2014-04-11: 2 mg via INTRAVENOUS
  Filled 2014-04-11: qty 1

## 2014-04-11 MED ORDER — ROCURONIUM BROMIDE 100 MG/10ML IV SOLN
INTRAVENOUS | Status: DC | PRN
  Administered 2014-04-11: 30 mg via INTRAVENOUS

## 2014-04-11 MED ORDER — MORPHINE SULFATE 2 MG/ML IJ SOLN
INTRAMUSCULAR | Status: AC
  Filled 2014-04-11: qty 1

## 2014-04-11 MED ORDER — NEOSTIGMINE METHYLSULFATE 10 MG/10ML IV SOLN
INTRAVENOUS | Status: AC
  Filled 2014-04-11: qty 1

## 2014-04-11 MED ORDER — VALSARTAN-HYDROCHLOROTHIAZIDE 320-25 MG PO TABS: 1.0000 | ORAL_TABLET | Freq: Every day | ORAL | Status: DC

## 2014-04-11 MED ORDER — FUROSEMIDE 20 MG PO TABS
20.0000 mg | ORAL_TABLET | ORAL | Status: DC
  Filled 2014-04-11: qty 1

## 2014-04-11 MED ORDER — BUPIVACAINE HCL (PF) 0.25 % IJ SOLN
INTRAMUSCULAR | Status: AC
  Filled 2014-04-11: qty 30

## 2014-04-11 MED ORDER — FENTANYL CITRATE 0.05 MG/ML IJ SOLN
INTRAMUSCULAR | Status: AC
  Filled 2014-04-11: qty 5

## 2014-04-11 MED ORDER — VITAMIN D3 25 MCG (1000 UNIT) PO TABS
3000.0000 [IU] | ORAL_TABLET | Freq: Every day | ORAL | Status: DC
  Administered 2014-04-11: 3000 [IU] via ORAL
  Filled 2014-04-11 (×2): qty 3

## 2014-04-11 MED ORDER — ONDANSETRON HCL 4 MG/2ML IJ SOLN: 4.0000 mg | Freq: Four times a day (QID) | INTRAMUSCULAR | Status: DC | PRN

## 2014-04-11 MED ORDER — POLYETHYLENE GLYCOL 3350 17 G PO PACK
17.0000 g | PACK | Freq: Two times a day (BID) | ORAL | Status: DC
  Administered 2014-04-11 – 2014-04-12 (×3): 17 g via ORAL
  Filled 2014-04-11 (×5): qty 1

## 2014-04-11 MED ORDER — HYDROCHLOROTHIAZIDE 25 MG PO TABS
25.0000 mg | ORAL_TABLET | Freq: Every day | ORAL | Status: DC
  Administered 2014-04-11 – 2014-04-12 (×2): 25 mg via ORAL
  Filled 2014-04-11 (×2): qty 1

## 2014-04-11 MED ORDER — RIVAROXABAN 10 MG PO TABS
10.0000 mg | ORAL_TABLET | Freq: Every day | ORAL | Status: DC
  Administered 2014-04-12: 10 mg via ORAL
  Filled 2014-04-11 (×2): qty 1

## 2014-04-11 MED ORDER — PHENYLEPHRINE HCL 10 MG/ML IJ SOLN
INTRAMUSCULAR | Status: DC | PRN
  Administered 2014-04-11: 80 ug via INTRAVENOUS

## 2014-04-11 MED ORDER — MAGNESIUM 400 MG PO CAPS: 400.0000 mg | ORAL_CAPSULE | Freq: Every day | ORAL | Status: DC

## 2014-04-11 MED ORDER — ADULT MULTIVITAMIN W/MINERALS CH
2.0000 | ORAL_TABLET | Freq: Every day | ORAL | Status: DC
  Administered 2014-04-11: 2 via ORAL
  Filled 2014-04-11 (×2): qty 2

## 2014-04-11 MED ORDER — POTASSIUM CHLORIDE IN NACL 20-0.9 MEQ/L-% IV SOLN
INTRAVENOUS | Status: DC
  Administered 2014-04-12: 01:00:00 via INTRAVENOUS
  Filled 2014-04-11 (×4): qty 1000

## 2014-04-11 MED ORDER — ARTIFICIAL TEARS OP OINT
TOPICAL_OINTMENT | OPHTHALMIC | Status: DC | PRN
  Administered 2014-04-11: 1 via OPHTHALMIC

## 2014-04-11 MED ORDER — POVIDONE-IODINE 7.5 % EX SOLN: Freq: Once | CUTANEOUS | Status: DC

## 2014-04-11 MED ORDER — SODIUM CHLORIDE 0.9 % IR SOLN
Status: DC | PRN
  Administered 2014-04-11: 1000 mL

## 2014-04-11 MED ORDER — INSULIN ASPART 100 UNIT/ML ~~LOC~~ SOLN
0.0000 [IU] | Freq: Every day | SUBCUTANEOUS | Status: DC
  Administered 2014-04-11: 2 [IU] via SUBCUTANEOUS

## 2014-04-11 MED ORDER — PROPOFOL 10 MG/ML IV BOLUS
INTRAVENOUS | Status: AC
  Filled 2014-04-11: qty 20

## 2014-04-11 MED ORDER — MIDAZOLAM HCL 2 MG/2ML IJ SOLN
INTRAMUSCULAR | Status: AC
  Filled 2014-04-11: qty 2

## 2014-04-11 MED ORDER — MULTI-VITAMIN/MINERALS PO TABS: 2.0000 | ORAL_TABLET | Freq: Every day | ORAL | Status: DC

## 2014-04-11 MED ORDER — CELECOXIB 200 MG PO CAPS
200.0000 mg | ORAL_CAPSULE | Freq: Two times a day (BID) | ORAL | Status: DC
  Administered 2014-04-11 – 2014-04-12 (×3): 200 mg via ORAL
  Filled 2014-04-11 (×4): qty 1

## 2014-04-11 MED ORDER — ROCURONIUM BROMIDE 50 MG/5ML IV SOLN
INTRAVENOUS | Status: AC
  Filled 2014-04-11: qty 1

## 2014-04-11 MED ORDER — PANTOPRAZOLE SODIUM 40 MG PO TBEC
40.0000 mg | DELAYED_RELEASE_TABLET | Freq: Every day | ORAL | Status: DC
  Administered 2014-04-12: 40 mg via ORAL
  Filled 2014-04-11: qty 1

## 2014-04-11 MED ORDER — POLYETHYL GLYCOL-PROPYL GLYCOL 0.4-0.3 % OP SOLN: 1.0000 [drp] | Freq: Every day | OPHTHALMIC | Status: DC

## 2014-04-11 MED ORDER — ONDANSETRON HCL 4 MG PO TABS: 4.0000 mg | ORAL_TABLET | Freq: Four times a day (QID) | ORAL | Status: DC | PRN

## 2014-04-11 MED ORDER — LIDOCAINE HCL (CARDIAC) 20 MG/ML IV SOLN
INTRAVENOUS | Status: DC | PRN
  Administered 2014-04-11: 100 mg via INTRAVENOUS

## 2014-04-11 MED ORDER — ALIGN 4 MG PO CAPS: 4.0000 mg | ORAL_CAPSULE | Freq: Every day | ORAL | Status: DC

## 2014-04-11 MED ORDER — LORATADINE 10 MG PO TABS
10.0000 mg | ORAL_TABLET | Freq: Every day | ORAL | Status: DC
  Administered 2014-04-12: 10 mg via ORAL
  Filled 2014-04-11: qty 1

## 2014-04-11 MED ORDER — PROPOFOL 10 MG/ML IV BOLUS
INTRAVENOUS | Status: DC | PRN
  Administered 2014-04-11: 200 mg via INTRAVENOUS

## 2014-04-11 MED ORDER — IRBESARTAN 300 MG PO TABS
300.0000 mg | ORAL_TABLET | Freq: Every day | ORAL | Status: DC
  Administered 2014-04-11 – 2014-04-12 (×2): 300 mg via ORAL
  Filled 2014-04-11 (×2): qty 1

## 2014-04-11 MED ORDER — FENTANYL CITRATE 0.05 MG/ML IJ SOLN
INTRAMUSCULAR | Status: DC | PRN
  Administered 2014-04-11: 100 ug via INTRAVENOUS
  Administered 2014-04-11 (×3): 50 ug via INTRAVENOUS
  Administered 2014-04-11: 100 ug via INTRAVENOUS
  Administered 2014-04-11: 50 ug via INTRAVENOUS
  Administered 2014-04-11: 100 ug via INTRAVENOUS

## 2014-04-11 MED ORDER — GLYCOPYRROLATE 0.2 MG/ML IJ SOLN
INTRAMUSCULAR | Status: DC | PRN
  Administered 2014-04-11: 0.6 mg via INTRAVENOUS

## 2014-04-11 MED ORDER — CLOBETASOL PROP EMOLLIENT BASE 0.05 % EX CREA: 1.0000 "application " | TOPICAL_CREAM | Freq: Two times a day (BID) | CUTANEOUS | Status: DC

## 2014-04-11 MED ORDER — BUPIVACAINE-EPINEPHRINE (PF) 0.5% -1:200000 IJ SOLN
INTRAMUSCULAR | Status: AC
  Filled 2014-04-11: qty 30

## 2014-04-11 MED ORDER — CLINDAMYCIN PHOSPHATE 600 MG/50ML IV SOLN
600.0000 mg | Freq: Four times a day (QID) | INTRAVENOUS | Status: AC
  Administered 2014-04-11 (×2): 600 mg via INTRAVENOUS
  Filled 2014-04-11 (×2): qty 50

## 2014-04-11 MED ORDER — DEXAMETHASONE SODIUM PHOSPHATE 10 MG/ML IJ SOLN
10.0000 mg | Freq: Three times a day (TID) | INTRAMUSCULAR | Status: AC
  Filled 2014-04-11 (×3): qty 1

## 2014-04-11 MED ORDER — SACCHAROMYCES BOULARDII 250 MG PO CAPS
250.0000 mg | ORAL_CAPSULE | Freq: Every day | ORAL | Status: DC
  Administered 2014-04-11 – 2014-04-12 (×2): 250 mg via ORAL
  Filled 2014-04-11 (×2): qty 1

## 2014-04-11 MED ORDER — ALBUTEROL SULFATE (2.5 MG/3ML) 0.083% IN NEBU: 3.0000 mL | INHALATION_SOLUTION | Freq: Four times a day (QID) | RESPIRATORY_TRACT | Status: DC | PRN

## 2014-04-11 MED ORDER — LACTATED RINGERS IV SOLN: INTRAVENOUS | Status: DC

## 2014-04-11 MED ORDER — DEXAMETHASONE 6 MG PO TABS
10.0000 mg | ORAL_TABLET | Freq: Three times a day (TID) | ORAL | Status: AC
  Administered 2014-04-11 – 2014-04-12 (×3): 10 mg via ORAL
  Filled 2014-04-11 (×3): qty 1

## 2014-04-11 MED ORDER — CHLORHEXIDINE GLUCONATE 4 % EX LIQD: 60.0000 mL | Freq: Once | CUTANEOUS | Status: DC

## 2014-04-11 MED ORDER — NEOSTIGMINE METHYLSULFATE 10 MG/10ML IV SOLN
INTRAVENOUS | Status: DC | PRN
  Administered 2014-04-11: 4 mg via INTRAVENOUS

## 2014-04-11 MED ORDER — HYDROQUINONE 4 % EX CREA
1.0000 "application " | TOPICAL_CREAM | Freq: Two times a day (BID) | CUTANEOUS | Status: DC
  Administered 2014-04-11: 1 via TOPICAL

## 2014-04-11 MED ORDER — PHENOL 1.4 % MT LIQD: 1.0000 | OROMUCOSAL | Status: DC | PRN

## 2014-04-11 MED ORDER — LIDOCAINE HCL (CARDIAC) 20 MG/ML IV SOLN
INTRAVENOUS | Status: AC
  Filled 2014-04-11: qty 5

## 2014-04-11 MED ORDER — MIDAZOLAM HCL 5 MG/5ML IJ SOLN
INTRAMUSCULAR | Status: DC | PRN
  Administered 2014-04-11 (×2): 1 mg via INTRAVENOUS

## 2014-04-11 MED ORDER — HYDROMORPHONE HCL 1 MG/ML IJ SOLN: 0.2500 mg | INTRAMUSCULAR | Status: DC | PRN

## 2014-04-11 MED ORDER — FENTANYL CITRATE 0.05 MG/ML IJ SOLN
INTRAMUSCULAR | Status: AC
  Administered 2014-04-11: 25 ug via INTRAVENOUS
  Filled 2014-04-11: qty 2

## 2014-04-11 MED ORDER — METOCLOPRAMIDE HCL 5 MG/ML IJ SOLN: 5.0000 mg | Freq: Three times a day (TID) | INTRAMUSCULAR | Status: DC | PRN

## 2014-04-11 MED ORDER — MORPHINE SULFATE 4 MG/ML IJ SOLN
4.0000 mg | INTRAMUSCULAR | Status: DC | PRN
  Administered 2014-04-12: 4 mg via INTRAVENOUS
  Filled 2014-04-11: qty 1

## 2014-04-11 MED ORDER — DEXAMETHASONE SODIUM PHOSPHATE 4 MG/ML IJ SOLN
INTRAMUSCULAR | Status: AC
  Filled 2014-04-11: qty 3

## 2014-04-11 MED ORDER — MENTHOL 3 MG MT LOZG: 1.0000 | LOZENGE | OROMUCOSAL | Status: DC | PRN

## 2014-04-11 MED ORDER — ONDANSETRON HCL 4 MG/2ML IJ SOLN
INTRAMUSCULAR | Status: DC | PRN
  Administered 2014-04-11: 4 mg via INTRAVENOUS

## 2014-04-11 MED ORDER — INSULIN GLARGINE 100 UNIT/ML ~~LOC~~ SOLN
10.0000 [IU] | Freq: Every day | SUBCUTANEOUS | Status: DC
  Administered 2014-04-11: 10 [IU] via SUBCUTANEOUS
  Filled 2014-04-11 (×2): qty 0.1

## 2014-04-11 MED ORDER — MORPHINE SULFATE 2 MG/ML IJ SOLN
1.0000 mg | INTRAMUSCULAR | Status: DC | PRN
  Administered 2014-04-11 (×6): 2 mg via INTRAVENOUS

## 2014-04-11 MED ORDER — CLOBETASOL PROPIONATE 0.05 % EX CREA
TOPICAL_CREAM | Freq: Two times a day (BID) | CUTANEOUS | Status: DC
  Administered 2014-04-12: 10:00:00 via TOPICAL
  Filled 2014-04-11: qty 15

## 2014-04-11 MED ORDER — HYDROCODONE-ACETAMINOPHEN 10-325 MG PO TABS
1.0000 | ORAL_TABLET | ORAL | Status: DC | PRN
  Administered 2014-04-11: 2 via ORAL
  Administered 2014-04-11 (×2): 1 via ORAL
  Administered 2014-04-12 (×2): 2 via ORAL
  Filled 2014-04-11 (×2): qty 1
  Filled 2014-04-11 (×3): qty 2

## 2014-04-11 MED ORDER — PROMETHAZINE HCL 25 MG/ML IJ SOLN: 6.2500 mg | INTRAMUSCULAR | Status: DC | PRN

## 2014-04-11 MED ORDER — FENTANYL CITRATE 0.05 MG/ML IJ SOLN
25.0000 ug | INTRAMUSCULAR | Status: DC | PRN
  Administered 2014-04-11 (×3): 25 ug via INTRAVENOUS

## 2014-04-11 MED ORDER — INSULIN ASPART 100 UNIT/ML ~~LOC~~ SOLN
0.0000 [IU] | Freq: Three times a day (TID) | SUBCUTANEOUS | Status: DC
  Administered 2014-04-11: 8 [IU] via SUBCUTANEOUS
  Administered 2014-04-12: 3 [IU] via SUBCUTANEOUS

## 2014-04-11 MED ORDER — ACETAMINOPHEN 325 MG PO TABS: 650.0000 mg | ORAL_TABLET | Freq: Four times a day (QID) | ORAL | Status: DC | PRN

## 2014-04-11 MED ORDER — LACTATED RINGERS IV SOLN
INTRAVENOUS | Status: DC | PRN
  Administered 2014-04-11 (×2): via INTRAVENOUS

## 2014-04-11 MED ORDER — INFLUENZA VAC SPLIT QUAD 0.5 ML IM SUSY
0.5000 mL | PREFILLED_SYRINGE | INTRAMUSCULAR | Status: AC
  Administered 2014-04-12: 0.5 mL via INTRAMUSCULAR
  Filled 2014-04-11: qty 0.5

## 2014-04-11 SURGICAL SUPPLY — 65 items
BANDAGE ELASTIC 6 VELCRO ST LF (GAUZE/BANDAGES/DRESSINGS) ×2 IMPLANT
BANDAGE ESMARK 6X9 LF (GAUZE/BANDAGES/DRESSINGS) ×1 IMPLANT
BLADE SAGITTAL 25.0X1.19X90 (BLADE) ×2 IMPLANT
BLADE SAW SGTL 11.0X1.19X90.0M (BLADE) IMPLANT
BLADE SAW SGTL 13.0X1.19X90.0M (BLADE) ×2 IMPLANT
BLADE SURG 10 STRL SS (BLADE) ×4 IMPLANT
BNDG ELASTIC 6X15 VLCR STRL LF (GAUZE/BANDAGES/DRESSINGS) ×2 IMPLANT
BNDG ESMARK 6X9 LF (GAUZE/BANDAGES/DRESSINGS) ×2
BOWL SMART MIX CTS (DISPOSABLE) ×2 IMPLANT
CAPT RP KNEE ×2 IMPLANT
CEMENT HV SMART SET (Cement) ×4 IMPLANT
COVER SURGICAL LIGHT HANDLE (MISCELLANEOUS) ×2 IMPLANT
CUFF TOURNIQUET SINGLE 34IN LL (TOURNIQUET CUFF) IMPLANT
CUFF TOURNIQUET SINGLE 44IN (TOURNIQUET CUFF) ×2 IMPLANT
DRAPE EXTREMITY T 121X128X90 (DRAPE) ×2 IMPLANT
DRAPE INCISE IOBAN 66X45 STRL (DRAPES) ×2 IMPLANT
DRAPE PROXIMA HALF (DRAPES) ×2 IMPLANT
DRAPE U-SHAPE 47X51 STRL (DRAPES) ×2 IMPLANT
DRSG ADAPTIC 3X8 NADH LF (GAUZE/BANDAGES/DRESSINGS) IMPLANT
DRSG AQUACEL AG ADV 3.5X14 (GAUZE/BANDAGES/DRESSINGS) ×2 IMPLANT
DRSG PAD ABDOMINAL 8X10 ST (GAUZE/BANDAGES/DRESSINGS) ×4 IMPLANT
DURAPREP 26ML APPLICATOR (WOUND CARE) ×4 IMPLANT
ELECT CAUTERY BLADE 6.4 (BLADE) ×2 IMPLANT
ELECT REM PT RETURN 9FT ADLT (ELECTROSURGICAL) ×2
ELECTRODE REM PT RTRN 9FT ADLT (ELECTROSURGICAL) ×1 IMPLANT
EVACUATOR 1/8 PVC DRAIN (DRAIN) ×2 IMPLANT
FACESHIELD WRAPAROUND (MASK) ×4 IMPLANT
GAUZE SPONGE 4X4 12PLY STRL (GAUZE/BANDAGES/DRESSINGS) ×2 IMPLANT
GLOVE BIO SURGEON STRL SZ7 (GLOVE) ×2 IMPLANT
GLOVE BIOGEL PI IND STRL 7.0 (GLOVE) ×1 IMPLANT
GLOVE BIOGEL PI IND STRL 7.5 (GLOVE) ×1 IMPLANT
GLOVE BIOGEL PI INDICATOR 7.0 (GLOVE) ×1
GLOVE BIOGEL PI INDICATOR 7.5 (GLOVE) ×1
GLOVE SS BIOGEL STRL SZ 7.5 (GLOVE) ×1 IMPLANT
GLOVE SUPERSENSE BIOGEL SZ 7.5 (GLOVE) ×1
GOWN STRL REUS W/ TWL LRG LVL3 (GOWN DISPOSABLE) ×2 IMPLANT
GOWN STRL REUS W/ TWL XL LVL3 (GOWN DISPOSABLE) ×2 IMPLANT
GOWN STRL REUS W/TWL LRG LVL3 (GOWN DISPOSABLE) ×2
GOWN STRL REUS W/TWL XL LVL3 (GOWN DISPOSABLE) ×2
HANDPIECE INTERPULSE COAX TIP (DISPOSABLE) ×1
HOOD PEEL AWAY FACE SHEILD DIS (HOOD) ×4 IMPLANT
IMMOBILIZER KNEE 22 UNIV (SOFTGOODS) IMPLANT
KIT BASIN OR (CUSTOM PROCEDURE TRAY) ×2 IMPLANT
KIT ROOM TURNOVER OR (KITS) ×2 IMPLANT
MANIFOLD NEPTUNE II (INSTRUMENTS) ×2 IMPLANT
MARKER SKIN DUAL TIP RULER LAB (MISCELLANEOUS) ×2 IMPLANT
NS IRRIG 1000ML POUR BTL (IV SOLUTION) ×2 IMPLANT
PACK TOTAL JOINT (CUSTOM PROCEDURE TRAY) ×2 IMPLANT
PAD ARMBOARD 7.5X6 YLW CONV (MISCELLANEOUS) ×4 IMPLANT
PADDING CAST COTTON 6X4 STRL (CAST SUPPLIES) ×2 IMPLANT
RUBBERBAND STERILE (MISCELLANEOUS) ×2 IMPLANT
SET HNDPC FAN SPRY TIP SCT (DISPOSABLE) ×1 IMPLANT
STRIP CLOSURE SKIN 1/2X4 (GAUZE/BANDAGES/DRESSINGS) ×2 IMPLANT
SUCTION FRAZIER TIP 10 FR DISP (SUCTIONS) ×2 IMPLANT
SUT ETHIBOND NAB CT1 #1 30IN (SUTURE) ×4 IMPLANT
SUT MNCRL AB 3-0 PS2 18 (SUTURE) ×2 IMPLANT
SUT VIC AB 0 CT1 27 (SUTURE) ×3
SUT VIC AB 0 CT1 27XBRD ANBCTR (SUTURE) ×3 IMPLANT
SUT VIC AB 2-0 CT1 27 (SUTURE) ×2
SUT VIC AB 2-0 CT1 TAPERPNT 27 (SUTURE) ×2 IMPLANT
SYR 30ML SLIP (SYRINGE) ×2 IMPLANT
TOWEL OR 17X24 6PK STRL BLUE (TOWEL DISPOSABLE) ×2 IMPLANT
TOWEL OR 17X26 10 PK STRL BLUE (TOWEL DISPOSABLE) ×2 IMPLANT
TRAY FOLEY CATH 16FR SILVER (SET/KITS/TRAYS/PACK) ×2 IMPLANT
WATER STERILE IRR 1000ML POUR (IV SOLUTION) ×2 IMPLANT

## 2014-04-11 NOTE — Transfer of Care (Signed)
Immediate Anesthesia Transfer of Care Note  Patient: Breanna Ross  Procedure(s) Performed: Procedure(s): TOTAL KNEE ARTHROPLASTY (Right)  Patient Location: PACU  Anesthesia Type:GA combined with regional for post-op pain  Level of Consciousness: awake, alert  and oriented  Airway & Oxygen Therapy: Patient Spontanous Breathing and Patient connected to face mask oxygen  Post-op Assessment: Report given to PACU RN, Post -op Vital signs reviewed and stable and Patient moving all extremities X 4  Post vital signs: Reviewed and stable  Complications: No apparent anesthesia complications

## 2014-04-11 NOTE — Plan of Care (Signed)
Problem: Consults Goal: Diagnosis- Total Joint Replacement Primary Total Knee Right     

## 2014-04-11 NOTE — Evaluation (Signed)
Physical Therapy Evaluation Patient Details Name: Breanna Ross MRN: [id number] DOB: 12/26/1950 Today's Date: 04/11/2014   History of Present Illness  patient s/p elective Right TKR, PMH pertinent for Left TKR  Clinical Impression  Patient did well with first time OOB.  Mostly limited by pain and dizziness - to be expected for first day.  Anticipate patient will progress steadily with PT and discharge home as planned.  Patient will benefit from PT to increase mobility and independence.      Follow Up Recommendations Home health PT    Equipment Recommendations  None recommended by PT    Recommendations for Other Services       Precautions / Restrictions Precautions Precautions: Knee Required Braces or Orthoses: Knee Immobilizer - Right Knee Immobilizer - Right: On except when in CPM Restrictions Weight Bearing Restrictions: Yes RLE Weight Bearing: Weight bearing as tolerated      Mobility  Bed Mobility Overal bed mobility: Needs Assistance Bed Mobility: Supine to Sit     Supine to sit: Min assist     General bed mobility comments: assist for right LE  Transfers Overall transfer level: Needs assistance Equipment used: Rolling walker (2 wheeled) Transfers: Sit to/from Stand Sit to Stand: Min assist         General transfer comment: assist to power up  Ambulation/Gait Ambulation/Gait assistance: Min assist Ambulation Distance (Feet): 2 Feet Assistive device: Rolling walker (2 wheeled) Gait Pattern/deviations: Step-to pattern;Decreased stance time - right;Decreased weight shift to right Gait velocity: decreased   General Gait Details: patient self-limiting weight bearing on right secondary to pain.  Stairs            Wheelchair Mobility    Modified Rankin (Stroke Patients Only)       Balance Overall balance assessment: Needs assistance Sitting-balance support: No upper extremity supported Sitting balance-Leahy Scale: Good     Standing  balance support: Bilateral upper extremity supported Standing balance-Leahy Scale: Poor                               Pertinent Vitals/Pain Pain Assessment: 0-10 Pain Score: 7  Pain Location: right knee Pain Descriptors / Indicators: Aching;Operative site guarding Pain Intervention(s): Limited activity within patient's tolerance;Monitored during session;Premedicated before session    Home Living Family/patient expects to be discharged to:: Private residence Living Arrangements: Spouse/significant other Available Help at Discharge: Family;Available 24 hours/day Type of Home: House Home Access: Stairs to enter Entrance Stairs-Rails: None Entrance Stairs-Number of Steps: 3 Home Layout: One level Home Equipment: Walker - 2 wheels;Shower seat - built in;Bedside commode      Prior Function Level of Independence: Independent               Hand Dominance   Dominant Hand: Right    Extremity/Trunk Assessment               Lower Extremity Assessment: RLE deficits/detail         Communication   Communication: No difficulties  Cognition Arousal/Alertness: Awake/alert Behavior During Therapy: WFL for tasks assessed/performed Overall Cognitive Status: Within Functional Limits for tasks assessed                      General Comments      Exercises Total Joint Exercises Ankle Circles/Pumps: AROM;Both;10 reps;Seated      Assessment/Plan    PT Assessment Patient needs continued PT services  PT Diagnosis Acute pain;Difficulty walking  PT Problem List Decreased range of motion;Decreased activity tolerance;Decreased mobility;Pain  PT Treatment Interventions DME instruction;Gait training;Stair training;Functional mobility training;Therapeutic activities;Therapeutic exercise;Patient/family education   PT Goals (Current goals can be found in the Care Plan section) Acute Rehab PT Goals Patient Stated Goal: be able to walk around with my  grandchildren PT Goal Formulation: With patient/family Time For Goal Achievement: 04/15/14 Potential to Achieve Goals: Good    Frequency 7X/week   Barriers to discharge        Co-evaluation               End of Session Equipment Utilized During Treatment: Gait belt Activity Tolerance: Patient tolerated treatment well Patient left: in chair;with call bell/phone within reach;with family/visitor present           Time: 1435-1500 PT Time Calculation (min): 25 min   Charges:   PT Evaluation $Initial PT Evaluation Tier I: 1 Procedure PT Treatments $Gait Training: 8-22 mins   PT G CodesOlivia Canter, [place] [phone] 04/11/2014, 3:00 PM

## 2014-04-11 NOTE — Anesthesia Preprocedure Evaluation (Addendum)
Anesthesia Evaluation  Patient identified by MRN, date of birth, ID band Patient awake    Reviewed: Allergy & Precautions, NPO status , Patient's Chart, lab work & pertinent test results  History of Anesthesia Complications Negative for: history of anesthetic complications  Airway Mallampati: II TM Distance: >3 FB Neck ROM: Full    Dental  (+) Teeth Intact, Dental Advisory Given   Pulmonary shortness of breath and with exertion, asthma , former smoker,  breath sounds clear to auscultation        Cardiovascular hypertension, Pt. on medications Rhythm:Regular Rate:Normal     Neuro/Psych    GI/Hepatic GERD-  Medicated and Controlled,  Endo/Other  diabetes, Type 2, Oral Hypoglycemic AgentsMorbid obesity  Renal/GU      Musculoskeletal  (+) Arthritis -,   Abdominal   Peds  Hematology   Anesthesia Other Findings   Reproductive/Obstetrics                          Anesthesia Physical Anesthesia Plan  ASA: III  Anesthesia Plan: General   Post-op Pain Management:    Induction:   Airway Management Planned: Oral ETT  Additional Equipment:   Intra-op Plan:   Post-operative Plan: Extubation in OR  Informed Consent: I have reviewed the patients History and Physical, chart, labs and discussed the procedure including the risks, benefits and alternatives for the proposed anesthesia with the patient or authorized representative who has indicated his/her understanding and acceptance.   Dental advisory given  Plan Discussed with: CRNA and Anesthesiologist  Anesthesia Plan Comments:        Anesthesia Quick Evaluation

## 2014-04-11 NOTE — Progress Notes (Signed)
Orthopedic Tech Progress Note Patient Details:  Breanna Ross [birth year] [id number] CPM applied to RLE with appropriate settings. OHF applied to bed. Footsie roll provided. CPM Right Knee CPM Right Knee: On Right Knee Flexion (Degrees): 60 Right Knee Extension (Degrees): 0   Asia R Thompson 04/11/2014, 10:47 AM

## 2014-04-11 NOTE — Anesthesia Procedure Notes (Addendum)
Anesthesia Regional Block:  Adductor canal block  Pre-Anesthetic Checklist: ,, timeout performed, Correct Patient, Correct Site, Correct Laterality, Correct Procedure, Correct Position, site marked, Risks and benefits discussed,  Surgical consent,  Pre-op evaluation,  At surgeon's request and post-op pain management  Laterality: Right and Upper  Prep: chloraprep       Needles:   Needle Type: Echogenic Needle     Needle Length: 9cm 9 cm Needle Gauge: 21 and 21 G  Needle insertion depth: 8 cm   Additional Needles:  Procedures: ultrasound guided (picture in chart) Adductor canal block Narrative:  Start time: 04/11/2014 7:00 AM End time: 04/11/2014 7:14 AM Injection made incrementally with aspirations every 5 mL.  Performed by: With CRNAs  Anesthesiologist: t Masssagee  Additional Notes: Tolerated well   Procedure Name: Intubation Date/Time: 04/11/2014 7:28 AM Performed by: Reine Just Pre-anesthesia Checklist: Patient identified, Emergency Drugs available, Suction available, Patient being monitored and Timeout performed Patient Re-evaluated:Patient Re-evaluated prior to inductionOxygen Delivery Method: Circle system utilized and Simple face mask Preoxygenation: Pre-oxygenation with 100% oxygen Intubation Type: IV induction Ventilation: Mask ventilation without difficulty Laryngoscope Size: Mac and 4 Grade View: Grade I Tube type: Oral Tube size: 7.0 mm Number of attempts: 1 Airway Equipment and Method: Patient positioned with wedge pillow and Stylet Placement Confirmation: ETT inserted through vocal cords under direct vision,  positive ETCO2 and breath sounds checked- equal and bilateral Secured at: 21 cm Tube secured with: Tape Dental Injury: Teeth and Oropharynx as per pre-operative assessment

## 2014-04-11 NOTE — Op Note (Signed)
MRN:     [id number] DOB/AGE:    09-25-1950 / 63 y.o.       OPERATIVE REPORT    DATE OF PROCEDURE:  04/11/2014       PREOPERATIVE DIAGNOSIS:   djd right knee      Estimated body mass index is 45.95 kg/(m^2) as calculated from the following:   Height as of 04/01/14: 5' 6.5" (1.689 m).   Weight as of this encounter: 131.09 kg (289 lb).                                                        POSTOPERATIVE DIAGNOSIS:   djd right knee                                                                      PROCEDURE:  Procedure(s): TOTAL KNEE ARTHROPLASTY Using Depuy Sigma RP implants #3 Femur, #3Tibia, 12.27mm sigma RP bearing, 32 Patella     SURGEON: Kutter Schnepf A    ASSISTANT:  Kirstin Shepperson PA-C   (Present and scrubbed throughout the case, critical for assistance with exposure, retraction, instrumentation, and closure.)         ANESTHESIA: GET with Femoral Nerve Block  DRAINS: foley, 2 medium hemovac in knee   TOURNIQUET TIME:   COMPLICATIONS:  None     SPECIMENS: None   INDICATIONS FOR PROCEDURE: The patient has  djd right knee, varus deformities, XR shows bone on bone arthritis. Patient has failed all conservative measures including anti-inflammatory medicines, narcotics, attempts at  exercise and weight loss, cortisone injections and viscosupplementation.  Risks and benefits of surgery have been discussed, questions answered.   DESCRIPTION OF PROCEDURE: The patient identified by armband, received  right femoral nerve block and IV antibiotics, in the holding area at [facility]. Patient taken to the operating room, appropriate anesthetic  monitors were attached General endotracheal anesthesia induced with  the patient in supine position, Foley catheter was inserted. Tourniquet  applied high to the operative thigh. Lateral post and foot positioner  applied to the table, the lower extremity was then prepped and draped  in usual sterile fashion from the ankle to the  tourniquet. Time-out procedure was performed. The limb was wrapped with an Esmarch bandage and the tourniquet inflated to 365 mmHg. We began the operation by making the anterior midline incision starting at handbreadth above the patella going over the patella 1 cm medial to and  4 cm distal to the tibial tubercle. Small bleeders in the skin and the  subcutaneous tissue identified and cauterized. Transverse retinaculum was incised and reflected medially and a medial parapatellar arthrotomy was accomplished. the patella was everted and theprepatellar fat pad resected. The superficial medial collateral  ligament was then elevated from anterior to posterior along the proximal  flare of the tibia and anterior half of the menisci resected. The knee was hyperflexed exposing bone on bone arthritis. Peripheral and notch osteophytes as well as the cruciate ligaments were then resected. We continued to  work our way around posteriorly along the proximal tibia, and externally  rotated the tibia subluxing it out from underneath the femur. A McHale  retractor was placed through the notch and a lateral Hohmann retractor  placed, and we then drilled through the proximal tibia in line with the  axis of the tibia followed by an intramedullary guide rod and 2-degree  posterior slope cutting guide. The tibial cutting guide was pinned into place  allowing resection of 4 mm of bone medially and about 6 mm of bone  laterally because of her varus deformity. Satisfied with the tibial resection, we then  entered the distal femur 2 mm anterior to the PCL origin with the  intramedullary guide rod and applied the distal femoral cutting guide  set at 11mm, with 5 degrees of valgus. This was pinned along the  epicondylar axis. At this point, the distal femoral cut was accomplished without difficulty. We then sized for a #3 femoral component and pinned the guide in 3 degrees of external rotation.The chamfer cutting guide was pinned  into place. The anterior, posterior, and chamfer cuts were accomplished without difficulty followed by  the Sigma RP box cutting guide and the box cut. We also removed posterior osteophytes from the posterior femoral condyles. At this  time, the knee was brought into full extension. We checked our  extension and flexion gaps and found them symmetric at 12.22mm.  The patella thickness measured at 23 mm. We set the cutting guide at 14 and removed the posterior 9.5-10 mm  of the patella sized for 32 button and drilled the lollipop. The knee  was then once again hyperflexed exposing the proximal tibia. We sized for a #3 tibial base plate, applied the smokestack and the conical reamer followed by the the Delta fin keel punch. We then hammered into place the Sigma RP trial femoral component, inserted a 12.5-mm trial bearing, trial patellar button, and took the knee through range of motion from 0-130 degrees. No thumb pressure was required for patellar  tracking. At this point, all trial components were removed, a double batch of DePuy HV cement  was mixed and applied to all bony metallic mating surfaces except for the posterior condyles of the femur itself. In order, we  hammered into place the tibial tray and removed excess cement, the femoral component and removed excess cement, a 12.5-mm Sigma RP bearing  was inserted, and the knee brought to full extension with compression.  The patellar button was clamped into place, and excess cement  removed. While the cement cured the wound was irrigated out with normal saline solution pulse lavage, and medium Hemovac drains were placed.. Ligament stability and patellar tracking were checked and found to be excellent. The tourniquet was then released and hemostasis was obtained with cautery. The parapatellar arthrotomy was closed with  #1 ethibond suture. The subcutaneous tissue with 0 and 2-0 undyed  Vicryl suture, and 4-0 Monocryl.. A dressing of Xeroform,  4 x 4,  dressing sponges, Webril, and Ace wrap applied. Needle and sponge count were correct times 2.The patient awakened, extubated, and taken to recovery room without difficulty. Vascular status was normal, pulses 2+ and symmetric.   Jakobe Blau A 04/11/2014, 8:56 AM

## 2014-04-11 NOTE — Progress Notes (Signed)
Utilization review completed.  

## 2014-04-11 NOTE — Anesthesia Postprocedure Evaluation (Signed)
  Anesthesia Post-op Note  Patient: Breanna Ross  Procedure(s) Performed: Procedure(s): TOTAL KNEE ARTHROPLASTY (Right)  Patient Location: PACU  Anesthesia Type:General and GA combined with regional for post-op pain  Level of Consciousness: awake and alert   Airway and Oxygen Therapy: Patient Spontanous Breathing  Post-op Pain: moderate  Post-op Assessment: Post-op Vital signs reviewed  Post-op Vital Signs: stable  Last Vitals:  Filed Vitals:   04/11/14 1220  BP: 178/105  Pulse: 105  Temp: 36.8 C  Resp: 15    Complications: No apparent anesthesia complications

## 2014-04-11 NOTE — Interval H&P Note (Signed)
History and Physical Interval Note:  04/11/2014 7:02 AM  Breanna Ross  has presented today for surgery, with the diagnosis of djd right knee  The various methods of treatment have been discussed with the patient and family. After consideration of risks, benefits and other options for treatment, the patient has consented to  Procedure(s): TOTAL KNEE ARTHROPLASTY (Right) as a surgical intervention .  The patient's history has been reviewed, patient examined, no change in status, stable for surgery.  I have reviewed the patient's chart and labs.  Questions were answered to the patient's satisfaction.     Salvatore Marvel A

## 2014-04-12 ENCOUNTER — Encounter (HOSPITAL_COMMUNITY): Payer: Self-pay | Admitting: Orthopedic Surgery

## 2014-04-12 LAB — BASIC METABOLIC PANEL
Anion gap: 12 (ref 5–15)
BUN: 16 mg/dL (ref 6–23)
CO2: 27 mEq/L (ref 19–32)
Calcium: 8.9 mg/dL (ref 8.4–10.5)
Chloride: 99 mEq/L (ref 96–112)
Creatinine, Ser: 0.85 mg/dL (ref 0.50–1.10)
GFR calc Af Amer: 83 mL/min — ABNORMAL LOW (ref >90)
GFR calc non Af Amer: 72 mL/min — ABNORMAL LOW (ref >90)
Glucose, Bld: 158 mg/dL — ABNORMAL HIGH (ref 70–99)
Potassium: 4.4 mEq/L (ref 3.7–5.3)
Sodium: 138 mEq/L (ref 137–147)

## 2014-04-12 LAB — CBC
HCT: 36.1 % (ref 36.0–46.0)
Hemoglobin: 12.1 g/dL (ref 12.0–15.0)
MCH: 27.9 pg (ref 26.0–34.0)
MCHC: 33.5 g/dL (ref 30.0–36.0)
MCV: 83.2 fL (ref 78.0–100.0)
Platelets: 314 10*3/uL (ref 150–400)
RBC: 4.34 MIL/uL (ref 3.87–5.11)
RDW: 13.9 % (ref 11.5–15.5)
WBC: 12.6 10*3/uL — ABNORMAL HIGH (ref 4.0–10.5)

## 2014-04-12 LAB — GLUCOSE, CAPILLARY: Glucose-Capillary: 164 mg/dL — ABNORMAL HIGH (ref 70–99)

## 2014-04-12 MED ORDER — HYDROCODONE-ACETAMINOPHEN 10-325 MG PO TABS: 1.0000 | ORAL_TABLET | ORAL | Status: DC | PRN

## 2014-04-12 MED ORDER — MORPHINE SULFATE ER 15 MG PO TBCR
15.0000 mg | EXTENDED_RELEASE_TABLET | Freq: Two times a day (BID) | ORAL | Status: DC
  Administered 2014-04-12: 15 mg via ORAL
  Filled 2014-04-12: qty 1

## 2014-04-12 MED ORDER — RIVAROXABAN 10 MG PO TABS: 10.0000 mg | ORAL_TABLET | Freq: Every day | ORAL | Status: DC

## 2014-04-12 MED ORDER — POLYETHYLENE GLYCOL 3350 17 G PO PACK: 17.0000 g | PACK | Freq: Two times a day (BID) | ORAL | Status: DC

## 2014-04-12 MED ORDER — CELECOXIB 200 MG PO CAPS: 200.0000 mg | ORAL_CAPSULE | Freq: Every day | ORAL | Status: DC

## 2014-04-12 MED ORDER — MORPHINE SULFATE ER 15 MG PO TBCR: 15.0000 mg | EXTENDED_RELEASE_TABLET | Freq: Two times a day (BID) | ORAL | Status: DC

## 2014-04-12 NOTE — Progress Notes (Signed)
Physical Therapy Treatment Patient Details Name: Breanna Ross MRN: [id number] DOB: 1951-07-12 Today's Date: 04/12/2014    History of Present Illness patient s/p elective Right TKR, PMH pertinent for Left TKR    PT Comments    Pt moving great and anticipate continued great progress.  Pt ready for D/C from PT stand point.  Will continue to follow if remains on acute.    Follow Up Recommendations  Home health PT;Supervision - Intermittent     Equipment Recommendations  None recommended by PT    Recommendations for Other Services       Precautions / Restrictions Precautions Precautions: None Required Braces or Orthoses: Knee Immobilizer - Right Knee Immobilizer - Right: On when out of bed or walking;Discontinue once straight leg raise with < 10 degree lag Restrictions Weight Bearing Restrictions: Yes RLE Weight Bearing: Weight bearing as tolerated    Mobility  Bed Mobility Overal bed mobility: Needs Assistance Bed Mobility: Supine to Sit     Supine to sit: Supervision;HOB elevated     General bed mobility comments: pt moving slowly, but no A needed.    Transfers Overall transfer level: Needs assistance Equipment used: Rolling walker (2 wheeled) Transfers: Sit to/from Stand Sit to Stand: Min guard         General transfer comment: cues for UE use  Ambulation/Gait Ambulation/Gait assistance: Supervision Ambulation Distance (Feet): 100 Feet (x2) Assistive device: Rolling walker (2 wheeled) Gait Pattern/deviations: Step-through pattern;Decreased step length - left;Decreased stance time - right;Decreased stride length     General Gait Details: cues for more fluid gait pattern and positioning within RW.     Stairs Stairs: Yes Stairs assistance: Min assist Stair Management: No rails;Step to pattern;Backwards;With walker Number of Stairs: 4 General stair comments: cues for sequencing and safety.    Wheelchair Mobility    Modified Rankin (Stroke Patients  Only)       Balance                                    Cognition Arousal/Alertness: Awake/alert Behavior During Therapy: WFL for tasks assessed/performed Overall Cognitive Status: Within Functional Limits for tasks assessed                      Exercises Total Joint Exercises Ankle Circles/Pumps: AROM;Both;10 reps Quad Sets: AROM;Both;10 reps Long Arc Quad: AROM;Right;10 reps Knee Flexion: AROM;Right;10 reps    General Comments        Pertinent Vitals/Pain Pain Assessment: 0-10 Pain Score: 7  Pain Location: R knee Pain Descriptors / Indicators: Aching Pain Intervention(s): Repositioned;RN gave pain meds during session    Home Living                      Prior Function            PT Goals (current goals can now be found in the care plan section) Acute Rehab PT Goals Patient Stated Goal: be able to walk around with my grandchildren PT Goal Formulation: With patient/family Time For Goal Achievement: 04/15/14 Potential to Achieve Goals: Good Progress towards PT goals: Progressing toward goals    Frequency  7X/week    PT Plan Current plan remains appropriate    Co-evaluation             End of Session Equipment Utilized During Treatment: Gait belt Activity Tolerance: Patient tolerated treatment well Patient left: in  chair;with call bell/phone within reach;with family/visitor present     Time: 0751-0836 PT Time Calculation (min): 45 min  Charges:  $Gait Training: 8-22 mins $Therapeutic Exercise: 8-22 mins                    G CodesSunny Schlein, [place] [phone] 04/12/2014, 8:58 AM

## 2014-04-12 NOTE — Discharge Summary (Signed)
Patient ID: Breanna Ross MRN: [id number] DOB/AGE: 02/19/1951 63 y.o.  Admit date: 04/11/2014 Discharge date: 04/12/2014  Admission Diagnoses:  Principal Problem:   Primary localized osteoarthritis of right knee Active Problems:   Hypertension   GERD (gastroesophageal reflux disease)   Diabetes   DJD (degenerative joint disease) of knee   S/P left total knee arthroplasty   Discharge Diagnoses:  Same  Past Medical History  Diagnosis Date  . Hypertension   . GERD (gastroesophageal reflux disease)   . Hypercholesteremia   . Shortness of breath     with exertion  . Asthma   . Left knee DJD     BIlateral Knees  . Primary localized osteoarthritis of right knee   . Type 2 diabetes mellitus     Surgeries: Procedure(s): TOTAL KNEE ARTHROPLASTY on 04/11/2014   Consultants:    Discharged Condition: Improved  Hospital Course: Breanna Ross is an 63 y.o. female who was admitted 04/11/2014 for operative treatment ofPrimary localized osteoarthritis of right knee. Patient has severe unremitting pain that affects sleep, daily activities, and work/hobbies. After pre-op clearance the patient was taken to the operating room on 04/11/2014 and underwent  Procedure(s): TOTAL KNEE ARTHROPLASTY.    Patient was given perioperative antibiotics: Anti-infectives   Start     Dose/Rate Route Frequency Ordered Stop   04/11/14 1300  clindamycin (CLEOCIN) IVPB 600 mg     600 mg 100 mL/hr over 30 Minutes Intravenous Every 6 hours 04/11/14 1201 04/11/14 2037   04/11/14 0600  vancomycin (VANCOCIN) 1,500 mg in sodium chloride 0.9 % 500 mL IVPB     1,500 mg 250 mL/hr over 120 Minutes Intravenous On call to O.R. 04/10/14 1321 04/11/14 0820       Patient was given sequential compression devices, early ambulation, and chemoprophylaxis to prevent DVT.  Patient benefited maximally from hospital stay and there were no complications.    Recent vital signs: Patient Vitals for the past 24 hrs:  BP Temp  Temp src Pulse Resp SpO2  04/12/14 0827 153/93 mmHg 98 F (36.7 C) Oral 100 18 92 %  04/12/14 0733 - - - - 16 96 %  04/12/14 0531 138/87 mmHg 98.4 F (36.9 C) Oral 95 18 95 %  04/11/14 2355 140/86 mmHg 98.4 F (36.9 C) Oral - 16 95 %  04/11/14 2022 159/94 mmHg 98.1 F (36.7 C) Oral 104 16 97 %  04/11/14 1943 - - - - - 98 %  04/11/14 1220 178/105 mmHg 98.3 F (36.8 C) - 105 15 98 %  04/11/14 1200 146/100 mmHg 98.3 F (36.8 C) - 107 29 100 %  04/11/14 1145 154/96 mmHg - - - - -  04/11/14 1130 171/105 mmHg - - 100 20 100 %  04/11/14 1115 168/100 mmHg - - 106 10 94 %  04/11/14 1053 180/102 mmHg - - 103 17 98 %  04/11/14 1039 169/99 mmHg - - 105 35 98 %  04/11/14 1028 163/94 mmHg - - 101 24 100 %  04/11/14 1009 174/95 mmHg - - 101 28 98 %  04/11/14 0954 175/84 mmHg - - 99 17 94 %  04/11/14 0939 176/75 mmHg - - 99 11 99 %  04/11/14 0936 - 98.3 F (36.8 C) - - - -     Recent laboratory studies:  Recent Labs  04/12/14 0440  WBC 12.6*  HGB 12.1  HCT 36.1  PLT 314  NA 138  K 4.4  CL 99  CO2 27  BUN 16  CREATININE 0.85  GLUCOSE 158*  CALCIUM 8.9     Discharge Medications:     Medication List    STOP taking these medications       acetaminophen 500 MG tablet  Commonly known as:  TYLENOL     ARNICARE Gel     CoQ10 100 MG Caps     EVENING PRIMROSE OIL PO     omega-3 acid ethyl esters 1 G capsule  Commonly known as:  LOVAZA     vitamin E 400 UNIT capsule      TAKE these medications       albuterol 108 (90 BASE) MCG/ACT inhaler  Commonly known as:  PROVENTIL HFA;VENTOLIN HFA  Inhale 2 puffs into the lungs every 6 (six) hours as needed for wheezing or shortness of breath.     ALIGN 4 MG Caps  Take 4 mg by mouth daily.     CALCIUM + D3 PO  Take 1 tablet by mouth 2 (two) times daily.     celecoxib 200 MG capsule  Commonly known as:  CELEBREX  Take 1 capsule (200 mg total) by mouth daily.     cetirizine 10 MG tablet  Commonly known as:  ZYRTEC  Take  10 mg by mouth daily.     cholecalciferol 1000 UNITS tablet  Commonly known as:  VITAMIN D  Take 3,000 Units by mouth daily.     CLOBETASOL PROPIONATE E 0.05 % emollient cream  Generic drug:  Clobetasol Prop Emollient Base  Apply 1 application topically 2 (two) times daily.     DEEP BLUE RELIEF Gel  Apply 1 application topically 3 (three) times daily as needed (for knee pain).     esomeprazole 40 MG capsule  Commonly known as:  NEXIUM  Take 40 mg by mouth daily before breakfast.     furosemide 20 MG tablet  Commonly known as:  LASIX  Take 20 mg by mouth every other day.     HYDROcodone-acetaminophen 10-325 MG per tablet  Commonly known as:  NORCO  Take 1-2 tablets by mouth every 4 (four) hours as needed (breakthrough pain).     hydroquinone 4 % cream  Apply 1 application topically 2 (two) times daily.     Magnesium 400 MG Caps  Take 400 mg by mouth daily.     metFORMIN 500 MG tablet  Commonly known as:  GLUCOPHAGE  Take 250 mg by mouth daily.     mometasone-formoterol 100-5 MCG/ACT Aero  Commonly known as:  DULERA  Inhale 2 puffs into the lungs 2 (two) times daily.     morphine 15 MG 12 hr tablet  Commonly known as:  MS CONTIN  Take 1 tablet (15 mg total) by mouth every 12 (twelve) hours.     multivitamin with minerals tablet  Take 2 tablets by mouth daily. Alive Gummys     polyethylene glycol packet  Commonly known as:  MIRALAX / GLYCOLAX  Take 17 g by mouth 2 (two) times daily.     rivaroxaban 10 MG Tabs tablet  Commonly known as:  XARELTO  Take 1 tablet (10 mg total) by mouth daily with breakfast.     SYSTANE OP  Place 1 drop into both eyes daily as needed (for irritation).     valsartan-hydrochlorothiazide 320-25 MG per tablet  Commonly known as:  DIOVAN-HCT  Take 1 tablet by mouth daily.        Diagnostic Studies: Mm Digital Screening Bilateral  04/01/2014   CLINICAL DATA:  Screening.  EXAM: DIGITAL SCREENING BILATERAL MAMMOGRAM WITH CAD   COMPARISON:  Previous exam(s).  ACR Breast Density Category a: The breast tissue is almost entirely fatty.  FINDINGS: There are no findings suspicious for malignancy. Images were processed with CAD.  IMPRESSION: No mammographic evidence of malignancy. A result letter of this screening mammogram will be mailed directly to the patient.  RECOMMENDATION: Screening mammogram in one year. (Code:SM-B-01Y)  BI-RADS CATEGORY  1: Negative.   Electronically Signed   By: Hulan Saas M.D.   On: 04/01/2014 11:48    Disposition: 06-Home-Health Care Svc      Discharge Instructions   CPM    Complete by:  As directed   Continuous passive motion machine (CPM):      Use the CPM from 0 to 90 for 6 hours per day.       You may break it up into 2 or 3 sessions per day.      Use CPM for 2 weeks or until you are told to stop.     Call MD / Call 911    Complete by:  As directed   If you experience chest pain or shortness of breath, CALL 911 and be transported to the hospital emergency room.  If you develope a fever above 101 F, pus (white drainage) or increased drainage or redness at the wound, or calf pain, call your surgeon's office.     Change dressing    Complete by:  As directed   Do not remove the dressing over the wound.  Wash over it with soap and water     Constipation Prevention    Complete by:  As directed   Drink plenty of fluids.  Prune juice may be helpful.  You may use a stool softener, such as Colace (over the counter) 100 mg twice a day.  Use MiraLax (over the counter) for constipation as needed.     Diet - low sodium heart healthy    Complete by:  As directed      Discharge instructions    Complete by:  As directed   Total Knee Replacement Care After Refer to this sheet in the next few weeks. These discharge instructions provide you with general information on caring for yourself after you leave the hospital. Your caregiver may also give you specific instructions. Your treatment has been  planned according to the most current medical practices available, but unavoidable complications sometimes occur. If you have any problems or questions after discharge, please call your caregiver. Regaining a near full range of motion of your knee within the first 3 to 6 weeks after surgery is critical. HOME CARE INSTRUCTIONS  You may resume a normal diet and activities as directed.  Perform exercises as directed.  Place gray foam block, curve side up under heel at all times except when in CPM or when walking.  DO NOT modify, tear, cut, or change in any way the gray foam block. You will receive physical therapy daily  Take showers instead of baths until informed otherwise.  You may shower on Sunday.  Please wash whole leg including wound with soap and water  Do not remove the dressing over the wound.  Wash over it with soap and water It is OK to take over-the-counter tylenol in addition to the oxycodone for pain, discomfort, or fever. Oxycodone is VERY constipating.  Please take stool softener twice a day and laxatives daily until bowels are regular Eat a well-balanced diet.  Avoid  lifting or driving until you are instructed otherwise.  Make an appointment to see your caregiver for stitches (suture) or staple removal as directed.  If you have been sent home with a continuous passive motion machine (CPM machine), 0-90 degrees 6 hrs a day   2 hrs a shift SEEK MEDICAL CARE IF: You have swelling of your calf or leg.  You develop shortness of breath or chest pain.  You have redness, swelling, or increasing pain in the wound.  There is pus or any unusual drainage coming from the surgical site.  You notice a bad smell coming from the surgical site or dressing.  The surgical site breaks open after sutures or staples have been removed.  There is persistent bleeding from the suture or staple line.  You are getting worse or are not improving.  You have any other questions or concerns.  SEEK IMMEDIATE  MEDICAL CARE IF:  You have a fever.  You develop a rash.  You have difficulty breathing.  You develop any reaction or side effects to medicines given.  Your knee motion is decreasing rather than improving.  MAKE SURE YOU:  Understand these instructions.  Will watch your condition.  Will get help right away if you are not doing well or get worse.     Do not put a pillow under the knee. Place it under the heel.    Complete by:  As directed   Place yellow foam block, yellow side up under heel at all times except when in CPM or when walking.  DO NOT modify, tear, cut, or change in any way the yellow foam block.     Increase activity slowly as tolerated    Complete by:  As directed      TED hose    Complete by:  As directed   Use stockings (TED hose) for 2 weeks on both leg(s).  You may remove them at night for sleeping.           Follow-up Information   Follow up with Nilda Simmer, MD On 04/25/2014. (aapt time 2:15)    Specialty:  Orthopedic Surgery   Contact information:   [address] [place] [address] [phone]        Signed: Pascal Lux 04/12/2014, 9:13 AM

## 2014-04-12 NOTE — Care Management Note (Signed)
CARE MANAGEMENT NOTE 04/12/2014  Patient:  Breanna Ross, Breanna Ross   Account Number:  [id number]  Date Initiated:  04/12/2014  Documentation initiated by:  Vance Peper  Subjective/Objective Assessment:   63 yr old female s/p right total knee arthroplasty.     Action/Plan:   Patient was preoperatively setup with [facility], no changes. Has family support at discharge. Patient has rolling walker and 3in1.   Anticipated DC Date:  04/12/2014   Anticipated DC Plan:  HOME W HOME HEALTH SERVICES      DC Planning Services  CM consult      [facility] Choice  HOME HEALTH  DURABLE MEDICAL EQUIPMENT   Choice offered to / List presented to:  C-1 Patient   DME arranged  CPM      DME agency  [employer]     HH arranged  HH-2 PT      HH agency  [facility]   Status of service:  Completed, signed off Medicare Important Message given?   (If response is "NO", the following Medicare IM given date fields will be blank) Date Medicare IM given:   Medicare IM given by:   Date Additional Medicare IM given:   Additional Medicare IM given by:    Discharge Disposition:  HOME W HOME HEALTH SERVICES  Per UR Regulation:  Reviewed for med. necessity/level of care/duration of stay

## 2014-10-24 ENCOUNTER — Ambulatory Visit (INDEPENDENT_AMBULATORY_CARE_PROVIDER_SITE_OTHER): Payer: 59 | Admitting: Family Medicine

## 2014-10-24 VITALS — BP 132/82 | HR 119 | Temp 98.3°F | Resp 17 | Ht 66.5 in | Wt 292.0 lb

## 2014-10-24 DIAGNOSIS — J0110 Acute frontal sinusitis, unspecified: Secondary | ICD-10-CM

## 2014-10-24 DIAGNOSIS — R05 Cough: Secondary | ICD-10-CM | POA: Diagnosis not present

## 2014-10-24 DIAGNOSIS — R059 Cough, unspecified: Secondary | ICD-10-CM

## 2014-10-24 MED ORDER — FLUTICASONE PROPIONATE 50 MCG/ACT NA SUSP: 2.0000 | Freq: Every day | NASAL | Status: DC

## 2014-10-24 MED ORDER — AZITHROMYCIN 250 MG PO TABS: ORAL_TABLET | ORAL | Status: DC

## 2014-10-24 MED ORDER — HYDROCOD POLST-CHLORPHEN POLST 10-8 MG/5ML PO LQCR: 5.0000 mL | Freq: Every evening | ORAL | Status: DC | PRN

## 2014-10-24 NOTE — Progress Notes (Signed)
Subjective:    Patient ID: Breanna Ross, female    DOB: Apr 15, 1951, 64 y.o.   MRN: [id number]  HPI This is a pleasant 64 yo female who presents today with facial pain under her eyes for 1 week. Four days of sore throat. Feels like she is very congested and nothing will come out. Coughed up some grayish sputum once this morning and thinks this came from post nasal drainage. She has a history of maxillary sinusitis, usually has every spring. She states that symptoms mainly originate from sinuses and she is bothered spring and fall. She has taken some OTC cold medicine and acetaminophen without relief. She takes zyrtec most days. She has not used a nasal steroid previously.   She sees Dr. Renae GlossShelton for regular care.  Past Medical History  Diagnosis Date  . Hypertension   . GERD (gastroesophageal reflux disease)   . Hypercholesteremia   . Shortness of breath     with exertion  . Asthma   . Left knee DJD     BIlateral Knees  . Primary localized osteoarthritis of right knee   . Type 2 diabetes mellitus    Past Surgical History  Procedure Laterality Date  . Hemorrhoid surgery    . Knee arthroscopy w/ meniscectomy Left 09-29-09  . Rotar cuff Bilateral 2007  . Breast surgery      reduction  . Total knee arthroplasty Left 06/21/2013    Dr Thurston HoleWainer  . Total knee arthroplasty Left 06/21/2013    Procedure: TOTAL KNEE ARTHROPLASTY- left;  Surgeon: Nilda Simmerobert A Wainer, MD;  Location: [facility] OR;  Service: Orthopedics;  Laterality: Left;  . Total knee arthroplasty Right 04/11/2014    Procedure: TOTAL KNEE ARTHROPLASTY;  Surgeon: Nilda Simmerobert A Wainer, MD;  [facility];  Service: Orthopedics;  Laterality: Right;   Family History  Problem Relation Age of Onset  . Hypertension Mother   . Hypertension Father    History   Social History  . Marital Status: Married    Spouse Name: N/A  . Number of Children: N/A  . Years of Education: N/A   Occupational History  . Not on file.   Social History Main  Topics  . Smoking status: Former Smoker -- 1.00 packs/day for 30 years    Quit date: 06/08/2006  . Smokeless tobacco: Never Used  . Alcohol Use: No  . Drug Use: No  . Sexual Activity: Not on file   Other Topics Concern  . Not on file   Social History Narrative    Review of Systems No fever, has felt some cold/hot spells, +wheeze- occasional, not persistent, rarely uses albuterol, + sore throat, + ear pain, + cough, no chest pain.     Objective:   Physical Exam  Constitutional: She is oriented to person, place, and time. She appears well-developed and well-nourished. No distress.  Obese.   HENT:  Head: Normocephalic and atraumatic.  Right Ear: Tympanic membrane, external ear and ear canal normal.  Left Ear: Tympanic membrane, external ear and ear canal normal.  Nose: Mucosal edema and rhinorrhea present. Right sinus exhibits maxillary sinus tenderness. Right sinus exhibits no frontal sinus tenderness. Left sinus exhibits maxillary sinus tenderness. Left sinus exhibits no frontal sinus tenderness.  Mouth/Throat: Uvula is midline and mucous membranes are normal. No oropharyngeal exudate, posterior oropharyngeal edema, posterior oropharyngeal erythema or tonsillar abscesses.  Cardiovascular: Normal rate, regular rhythm and normal heart sounds.   Heart rate 96 when auscultated after patient had sat.   Pulmonary/Chest:  Effort normal and breath sounds normal. No respiratory distress. She has no wheezes. She has no rales. She exhibits no tenderness.  Musculoskeletal: She exhibits no edema.  Neurological: She is alert and oriented to person, place, and time.  Skin: Skin is warm and dry. She is not diaphoretic.  Psychiatric: She has a normal mood and affect. Her behavior is normal. Judgment and thought content normal.  Vitals reviewed.  BP 132/82 mmHg  Pulse 119  Temp(Src) 98.3 F (36.8 C) (Oral)  Resp 17  Ht 5' 6.5" (1.689 m)  Wt 292 lb (132.45 kg)  BMI 46.43 kg/m2  SpO2 97%       Assessment & Plan:  1. Acute frontal sinusitis, recurrence not specified - most likely viral vs. Allergies  - Patient Instructions  Get some regular sudafed (generic fine) from behind the pharmacy counter- take one in the morning and one after lunch for congestion- use as little as needed because it can increase your blood pressure Afrin nasal spray- twice a day for up to 4 days- use 15-20 minutes before using prescription nasal spray (flonase) Be careful not to take multiple forms of acetaminophen.  Take plain Mucinex twice a day and drink plenty of fluids until your urine is very light yellow.  Try the above for 2-3 days before getting Azithromycin filled.   - azithromycin (ZITHROMAX) 250 MG tablet; Take two tablet the first day then one tablet daily until finished.  Dispense: 6 tablet; Refill: 0 - fluticasone (FLONASE) 50 MCG/ACT nasal spray; Place 2 sprays into both nostrils daily.  Dispense: 16 g; Refill: 6 -  2. Cough - chlorpheniramine-HYDROcodone (TUSSIONEX PENNKINETIC ER) 10-8 MG/5ML LQCR; Take 5 mLs by mouth at bedtime as needed.  Dispense: 70 mL; Refill: 0  - Patient instructed to RTC or follow up with PCP if no improvement in symptoms with above measures or if she develops fever, chest pain, SOB.   Emi Belfast, FNP-BC  [facility], [facility]  10/29/2014 10:11 PM

## 2014-10-24 NOTE — Patient Instructions (Signed)
Get some regular sudafed (generic fine) from behind the pharmacy counter- take one in the morning and one after lunch for congestion- use as little as needed because it can increase your blood pressure Afrin nasal spray- twice a day for up to 4 days- use 15-20 minutes before using prescription nasal spray (flonase) Be careful not to take multiple forms of acetaminophen.  Take plain Mucinex twice a day and drink plenty of fluids until your urine is very light yellow.  Try the above for 2-3 days before getting Azithromycin filled.

## 2015-03-07 ENCOUNTER — Other Ambulatory Visit: Payer: Self-pay

## 2015-03-07 DIAGNOSIS — Z1231 Encounter for screening mammogram for malignant neoplasm of breast: Secondary | ICD-10-CM

## 2015-04-19 DIAGNOSIS — J45909 Unspecified asthma, uncomplicated: Secondary | ICD-10-CM | POA: Insufficient documentation

## 2015-05-17 ENCOUNTER — Ambulatory Visit
Admission: RE | Admit: 2015-05-17 | Discharge: 2015-05-17 | Disposition: A | Discharge status: Home / Self Care | Payer: 59 | Source: Ambulatory Visit

## 2015-05-17 DIAGNOSIS — Z1231 Encounter for screening mammogram for malignant neoplasm of breast: Secondary | ICD-10-CM

## 2015-05-17 DIAGNOSIS — R7309 Other abnormal glucose: Secondary | ICD-10-CM | POA: Insufficient documentation

## 2015-12-04 ENCOUNTER — Ambulatory Visit: Payer: 59

## 2016-04-09 ENCOUNTER — Other Ambulatory Visit (HOSPITAL_COMMUNITY): Payer: Self-pay | Admitting: Orthopedic Surgery

## 2016-04-09 DIAGNOSIS — T84033A Mechanical loosening of internal left knee prosthetic joint, initial encounter: Secondary | ICD-10-CM

## 2016-04-17 ENCOUNTER — Encounter (HOSPITAL_COMMUNITY)
Admission: RE | Admit: 2016-04-17 | Discharge: 2016-04-17 | Disposition: A | Discharge status: Home / Self Care | Payer: 59 | Source: Ambulatory Visit | Attending: Orthopedic Surgery | Admitting: Orthopedic Surgery

## 2016-04-17 DIAGNOSIS — T84033A Mechanical loosening of internal left knee prosthetic joint, initial encounter: Secondary | ICD-10-CM | POA: Diagnosis not present

## 2016-04-17 DIAGNOSIS — Z96652 Presence of left artificial knee joint: Secondary | ICD-10-CM | POA: Diagnosis present

## 2016-04-17 MED ORDER — TECHNETIUM TC 99M MEDRONATE IV KIT
25.0000 | PACK | Freq: Once | INTRAVENOUS | Status: AC | PRN
  Administered 2016-04-17: 25 via INTRAVENOUS

## 2016-05-03 ENCOUNTER — Other Ambulatory Visit: Payer: Self-pay | Admitting: Orthopedic Surgery

## 2016-05-31 ENCOUNTER — Other Ambulatory Visit (HOSPITAL_COMMUNITY): Payer: Self-pay | Admitting: *Deleted

## 2016-05-31 ENCOUNTER — Encounter (HOSPITAL_COMMUNITY)
Admission: RE | Admit: 2016-05-31 | Discharge: 2016-05-31 | Disposition: A | Discharge status: Home / Self Care | Payer: 59 | Source: Ambulatory Visit | Attending: Orthopedic Surgery | Admitting: Orthopedic Surgery

## 2016-05-31 ENCOUNTER — Encounter (HOSPITAL_COMMUNITY): Payer: Self-pay

## 2016-05-31 DIAGNOSIS — T84033A Mechanical loosening of internal left knee prosthetic joint, initial encounter: Secondary | ICD-10-CM | POA: Insufficient documentation

## 2016-05-31 DIAGNOSIS — Z01812 Encounter for preprocedural laboratory examination: Secondary | ICD-10-CM | POA: Insufficient documentation

## 2016-05-31 DIAGNOSIS — Z01818 Encounter for other preprocedural examination: Secondary | ICD-10-CM | POA: Insufficient documentation

## 2016-05-31 DIAGNOSIS — Z0183 Encounter for blood typing: Secondary | ICD-10-CM | POA: Insufficient documentation

## 2016-05-31 DIAGNOSIS — Y838 Other surgical procedures as the cause of abnormal reaction of the patient, or of later complication, without mention of misadventure at the time of the procedure: Secondary | ICD-10-CM | POA: Diagnosis not present

## 2016-05-31 HISTORY — DX: Sleep apnea, unspecified: G47.30

## 2016-05-31 LAB — TYPE AND SCREEN
ABO/RH(D): O POS
Antibody Screen: NEGATIVE

## 2016-05-31 LAB — CBC WITH DIFFERENTIAL/PLATELET
Basophils Absolute: 0 K/uL (ref 0.0–0.1)
Basophils Relative: 0 %
Eosinophils Absolute: 0.2 K/uL (ref 0.0–0.7)
Eosinophils Relative: 2 %
HCT: 44 % (ref 36.0–46.0)
Hemoglobin: 14.6 g/dL (ref 12.0–15.0)
Lymphocytes Relative: 35 %
Lymphs Abs: 3.3 K/uL (ref 0.7–4.0)
MCH: 28.1 pg (ref 26.0–34.0)
MCHC: 33.2 g/dL (ref 30.0–36.0)
MCV: 84.6 fL (ref 78.0–100.0)
Monocytes Absolute: 0.6 K/uL (ref 0.1–1.0)
Monocytes Relative: 6 %
Neutro Abs: 5.2 K/uL (ref 1.7–7.7)
Neutrophils Relative %: 57 %
Platelets: 319 K/uL (ref 150–400)
RBC: 5.2 MIL/uL — ABNORMAL HIGH (ref 3.87–5.11)
RDW: 14.3 % (ref 11.5–15.5)
WBC: 9.4 K/uL (ref 4.0–10.5)

## 2016-05-31 LAB — URINALYSIS, ROUTINE W REFLEX MICROSCOPIC
Bilirubin Urine: NEGATIVE
Glucose, UA: NEGATIVE mg/dL
Hgb urine dipstick: NEGATIVE
Ketones, ur: NEGATIVE mg/dL
Leukocytes, UA: NEGATIVE
Nitrite: NEGATIVE
Protein, ur: NEGATIVE mg/dL
Specific Gravity, Urine: 1.024 (ref 1.005–1.030)
pH: 5 (ref 5.0–8.0)

## 2016-05-31 LAB — BASIC METABOLIC PANEL WITH GFR
Anion gap: 9 (ref 5–15)
BUN: 15 mg/dL (ref 6–20)
CO2: 28 mmol/L (ref 22–32)
Calcium: 9.4 mg/dL (ref 8.9–10.3)
Chloride: 102 mmol/L (ref 101–111)
Creatinine, Ser: 0.96 mg/dL (ref 0.44–1.00)
GFR calc Af Amer: >60 mL/min
GFR calc non Af Amer: >60 mL/min
Glucose, Bld: 89 mg/dL (ref 65–99)
Potassium: 3.6 mmol/L (ref 3.5–5.1)
Sodium: 139 mmol/L (ref 135–145)

## 2016-05-31 LAB — APTT: aPTT: 33 seconds (ref 24–36)

## 2016-05-31 LAB — SURGICAL PCR SCREEN
MRSA, PCR: NEGATIVE
Staphylococcus aureus: NEGATIVE

## 2016-05-31 LAB — PROTIME-INR
INR: 0.98
Prothrombin Time: 13 seconds (ref 11.4–15.2)

## 2016-05-31 LAB — GLUCOSE, CAPILLARY: Glucose-Capillary: 104 mg/dL — ABNORMAL HIGH (ref 65–99)

## 2016-05-31 NOTE — Progress Notes (Signed)
Pt denies cardiac history, chest pain or sob. States she had an EKG done at Dr. Selena BattenKim Shelton's office 3 months ago along with her A1C. Have requested copy of EKG and A1C report faxed to us. Pt states she does not check her blood sugar at home. CBG today was 104.

## 2016-05-31 NOTE — Pre-Procedure Instructions (Signed)
Dorian FurnaceMary B Hebdon  05/31/2016    Your procedure is scheduled on Monday, June 10, 2016 at 2:50 PM.   [facility] at 12:50 PM.   Call this number if you have problems the morning of surgery: [phone]   Questions prior to day of surgery, please call [phone] between 8 & 4 PM.   Remember:  Do not eat food or drink liquids after midnight Sunday, 06/09/16.   Take these medicines the morning of surgery with A SIP OF WATER: Acetaminophen (Tylenol), Esmeprazole (Nexium), Flonase, Dulera inhaler, Tramadol - if needed  Stop NSAIDS (Celebrex, Ibuprofen, Aleve, etc.), Multivitamins, Vitamin E, Fish Oil and Herbal medications 7 days prior to surgery. Do not use any Aspirin products 7 days prior to surgery.   How to Manage Your Diabetes Before Surgery   Why is it important to control my blood sugar before and after surgery?   Improving blood sugar levels before and after surgery helps healing and can limit problems.  A way of improving blood sugar control is eating a healthy diet by:  - Eating less sugar and carbohydrates  - Increasing activity/exercise  - Talk with your doctor about reaching your blood sugar goals  High blood sugars (greater than 180 mg/dL) can raise your risk of infections and slow down your recovery so you will need to focus on controlling your diabetes during the weeks before surgery.  Make sure that the doctor who takes care of your diabetes knows about your planned surgery including the date and location.  How do I manage my blood sugars before surgery?   Check your blood sugar at least 4 times a day, 2 days before surgery to make sure that they are not too high or low.  Check your blood sugar the morning of your surgery when you wake up and every 2 hours until you get to the Short-Stay unit.  Treat a low blood sugar (less than 70 mg/dL) with 1/2 cup of clear juice (cranberry or apple), 4 glucose tablets,  OR glucose gel.  Recheck blood sugar in 15 minutes after treatment (to make sure it is greater than 70 mg/dL).  If blood sugar is not greater than 70 mg/dL on re-check, call [phone] for further instructions.   Report your blood sugar to the Short-Stay nurse when you get to Short-Stay.  References:  [facility], 2007 "How to Manage your Diabetes Before and After Surgery".  What do I do about my diabetes medications?   Do not take oral diabetes medicines (pills) the morning of surgery.   Do not wear jewelry, make-up or nail polish.  Do not wear lotions, powders, or perfumes, or deoderant.  Do not shave 48 hours prior to surgery.    Do not bring valuables to the hospital.  [facility] for any belongings or valuables.  Contacts, dentures or bridgework may not be worn into surgery.  Leave your suitcase in the car.  After surgery it may be brought to your room.  For patients admitted to the hospital, discharge time will be determined by your treatment team.  Special instructions:  [place] - Preparing for Surgery  Before surgery, you can play an important role.  Because skin is not sterile, your skin needs to be as free of germs as possible.  You can reduce the number of germs on you skin by washing with CHG (chlorahexidine gluconate) soap before surgery.  CHG  is an antiseptic cleaner which kills germs and bonds with the skin to continue killing germs even after washing.  Please DO NOT use if you have an allergy to CHG or antibacterial soaps.  If your skin becomes reddened/irritated stop using the CHG and inform your nurse when you arrive at Short Stay.  Do not shave (including legs and underarms) for at least 48 hours prior to the first CHG shower.  You may shave your face.  Please follow these instructions carefully:   1.  Shower with CHG Soap the night before surgery and the                    morning of Surgery.  2.  If you choose  to wash your hair, wash your hair first as usual with your       normal shampoo.  3.  After you shampoo, rinse your hair and body thoroughly to remove the shampoo.  4.  Use CHG as you would any other liquid soap.  You can apply chg directly       to the skin and wash gently with scrungie or a clean washcloth.  5.  Apply the CHG Soap to your body ONLY FROM THE NECK DOWN.        Do not use on open wounds or open sores.  Avoid contact with your eyes, ears, mouth and genitals (private parts).  Wash genitals (private parts) with your normal soap.  6.  Wash thoroughly, paying special attention to the area where your surgery        will be performed.  7.  Thoroughly rinse your body with warm water from the neck down.  8.  DO NOT shower/wash with your normal soap after using and rinsing off       the CHG Soap.  9.  Pat yourself dry with a clean towel.            10.  Wear clean pajamas.            11.  Place clean sheets on your bed the night of your first shower and do not        sleep with pets.  Day of Surgery  Do not apply any lotions the morning of surgery.  Please wear clean clothes to the hospital.  Please read over the following fact sheets that you were given.

## 2016-06-01 LAB — HEMOGLOBIN A1C
Hgb A1c MFr Bld: 6.9 % — ABNORMAL HIGH (ref 4.8–5.6)
Mean Plasma Glucose: 151 mg/dL

## 2016-06-06 NOTE — H&P (Signed)
TOTAL KNEE REVISION ADMISSION H&P  Patient is being admitted for left revision total knee arthroplasty.  Subjective:  Chief Complaint:left knee pain.  HPI: Breanna Ross, 65 y.o. female, has a history of pain and functional disability in the left knee(s) due to failed previous arthroplasty and patient has failed non-surgical conservative treatments for greater than 12 weeks to include flexibility and strengthening excercises, use of assistive devices, weight reduction as appropriate and activity modification. The indications for the revision of the total knee arthroplasty are loosening of one or more components. Onset of symptoms was abrupt starting 1 years ago with rapidlly worsening course since that time.  Prior procedures on the left knee(s) include arthroplasty.  Patient currently rates pain in the left knee(s) at 10 out of 10 with activity. There is night pain, worsening of pain with activity and weight bearing, pain that interferes with activities of daily living and pain with passive range of motion.  Patient has evidence of prosthetic loosening by imaging studies. This condition presents safety issues increasing the risk of falls.   There is no current active infection.  Patient Active Problem List   Diagnosis Date Noted  . Primary localized osteoarthritis of right knee 03/23/2014  . Left knee DJD 06/21/2013  . DJD (degenerative joint disease) of knee 06/21/2013  . S/P left total knee arthroplasty 06/21/2013  . Hypertension   . GERD (gastroesophageal reflux disease)   . Diabetes [facility](HCC)    Past Medical History:  Diagnosis Date  . Asthma   . GERD (gastroesophageal reflux disease)   . Hypercholesteremia   . Hypertension   . Left knee DJD    BIlateral Knees  . Primary localized osteoarthritis of right knee   . Shortness of breath    with exertion  . Sleep apnea    unable to use cpap  . Type 2 diabetes mellitus (HCC)     Past Surgical History:  Procedure Laterality Date  .  BREAST SURGERY     reduction  . COLONOSCOPY    . HEMORRHOID SURGERY    . KNEE ARTHROSCOPY W/ MENISCECTOMY Left 09-29-09  . rotar cuff Bilateral 2007  . TOTAL KNEE ARTHROPLASTY Left 06/21/2013   Dr Thurston HoleWainer  . TOTAL KNEE ARTHROPLASTY Left 06/21/2013   Procedure: TOTAL KNEE ARTHROPLASTY- left;  Surgeon: Nilda Simmerobert A Wainer, MD;  Location: [facility] OR;  Service: Orthopedics;  Laterality: Left;  . TOTAL KNEE ARTHROPLASTY Right 04/11/2014   Procedure: TOTAL KNEE ARTHROPLASTY;  Surgeon: Nilda Simmerobert A Wainer, MD;  Location: [facility] OR;  Service: Orthopedics;  Laterality: Right;  . TUBAL LIGATION      No prescriptions prior to admission.   Allergies  Allergen Reactions  . Dilaudid [Hydromorphone Hcl] Other (See Comments)    Hallucinations   . Oxycodone Nausea Only  . Penicillins Swelling    Has patient had a PCN reaction causing immediate rash, facial/tongue/throat swelling, SOB or lightheadedness with hypotension: Yes Has patient had a PCN reaction causing severe rash involving mucus membranes or skin necrosis: No Has patient had a PCN reaction that required hospitalization No Has patient had a PCN reaction occurring within the last 10 years:No If all of the above answers are "NO", then may proceed with Cephalosporin use.     Social History  Substance Use Topics  . Smoking status: Former Smoker    Packs/day: 1.00    Years: 30.00    Quit date: 06/08/2006  . Smokeless tobacco: Never Used  . Alcohol use No    Family History  Problem Relation Age of Onset  . Hypertension Mother   . Hypertension Father       Review of Systems  Constitutional: Negative.   HENT: Positive for tinnitus.        Sinus problems  Eyes: Negative.   Respiratory: Positive for shortness of breath.   Cardiovascular: Positive for leg swelling.       HTN  Gastrointestinal: Negative.   Genitourinary: Negative.   Musculoskeletal: Positive for joint pain.  Skin: Negative.   Neurological: Negative.   Psychiatric/Behavioral:  Negative.      Objective:  Physical Exam  Constitutional: She is oriented to person, place, and time. She appears well-developed and well-nourished.  HENT:  Head: Normocephalic and atraumatic.  Eyes: Pupils are equal, round, and reactive to light.  Neck: Normal range of motion. Neck supple.  Cardiovascular: Intact distal pulses.   Respiratory: Effort normal.  Musculoskeletal: She exhibits tenderness.  Exquisitely tender along the medial femoral condyle of the left total knee.  To my palpation.  There is one plus effusion range of motion is 0-95 collateral ligaments are stable but varus stress deathly exacerbates her pain.    Neurological: She is alert and oriented to person, place, and time.  Skin: Skin is warm and dry.  Psychiatric: She has a normal mood and affect. Her behavior is normal. Judgment and thought content normal.    Vital signs in last 24 hours:    Labs:  Estimated body mass index is 47.87 kg/m as calculated from the following:   Height as of 05/31/16: 5' 6.5" (1.689 m).   Weight as of 05/31/16: 136.6 kg (301 lb 1.6 oz).  Imaging Review Plain radiographs demonstrate  varus shift and partial collapse of the medial femoral condyle.    Assessment/Plan:  End stage arthritis, left knee(s) with failed previous arthroplasty.   The patient history, physical examination, clinical judgment of the provider and imaging studies are consistent with end stage degenerative joint disease of the left knee(s), previous total knee arthroplasty. Revision total knee arthroplasty is deemed medically necessary. The treatment options including medical management, injection therapy, arthroscopy and revision arthroplasty were discussed at length. The risks and benefits of revision total knee arthroplasty were presented and reviewed. The risks due to aseptic loosening, infection, stiffness, patella tracking problems, thromboembolic complications and other imponderables were discussed. The  patient acknowledged the explanation, agreed to proceed with the plan and consent was signed. Patient is being admitted for inpatient treatment for surgery, pain control, PT, OT, prophylactic antibiotics, VTE prophylaxis, progressive ambulation and ADL's and discharge planning.The patient is planning to be discharged home with home health services

## 2016-06-07 ENCOUNTER — Other Ambulatory Visit: Payer: Self-pay | Admitting: Obstetrics and Gynecology

## 2016-06-07 DIAGNOSIS — Z1231 Encounter for screening mammogram for malignant neoplasm of breast: Secondary | ICD-10-CM

## 2016-06-07 MED ORDER — VANCOMYCIN HCL 10 G IV SOLR
1500.0000 mg | INTRAVENOUS | Status: AC
  Administered 2016-06-10: 1500 mg via INTRAVENOUS
  Filled 2016-06-07: qty 1500

## 2016-06-09 MED ORDER — DEXTROSE-NACL 5-0.45 % IV SOLN: INTRAVENOUS | Status: DC

## 2016-06-09 MED ORDER — CHLORHEXIDINE GLUCONATE 4 % EX LIQD: 60.0000 mL | Freq: Once | CUTANEOUS | Status: DC

## 2016-06-10 ENCOUNTER — Encounter (HOSPITAL_COMMUNITY)
Admission: RE | Disposition: A | Discharge status: Home Health | Payer: Self-pay | Source: Ambulatory Visit | Attending: Orthopedic Surgery

## 2016-06-10 ENCOUNTER — Inpatient Hospital Stay (HOSPITAL_COMMUNITY): Payer: 59 | Admitting: Certified Registered Nurse Anesthetist

## 2016-06-10 ENCOUNTER — Inpatient Hospital Stay (HOSPITAL_COMMUNITY)
Admission: RE | Admit: 2016-06-10 | Discharge: 2016-06-12 | DRG: 467 | Disposition: A | Discharge status: Home Health | Payer: 59 | Source: Ambulatory Visit | Attending: Orthopedic Surgery | Admitting: Orthopedic Surgery

## 2016-06-10 ENCOUNTER — Encounter (HOSPITAL_COMMUNITY): Payer: Self-pay | Admitting: Certified Registered Nurse Anesthetist

## 2016-06-10 ENCOUNTER — Inpatient Hospital Stay (HOSPITAL_COMMUNITY): Payer: 59

## 2016-06-10 DIAGNOSIS — J45909 Unspecified asthma, uncomplicated: Secondary | ICD-10-CM | POA: Diagnosis present

## 2016-06-10 DIAGNOSIS — Z419 Encounter for procedure for purposes other than remedying health state, unspecified: Secondary | ICD-10-CM

## 2016-06-10 DIAGNOSIS — I10 Essential (primary) hypertension: Secondary | ICD-10-CM | POA: Diagnosis present

## 2016-06-10 DIAGNOSIS — E7800 Pure hypercholesterolemia, unspecified: Secondary | ICD-10-CM | POA: Diagnosis present

## 2016-06-10 DIAGNOSIS — Z6841 Body Mass Index (BMI) 40.0 and over, adult: Secondary | ICD-10-CM | POA: Diagnosis not present

## 2016-06-10 DIAGNOSIS — Y792 Prosthetic and other implants, materials and accessory orthopedic devices associated with adverse incidents: Secondary | ICD-10-CM | POA: Diagnosis present

## 2016-06-10 DIAGNOSIS — D62 Acute posthemorrhagic anemia: Secondary | ICD-10-CM | POA: Diagnosis not present

## 2016-06-10 DIAGNOSIS — Z8249 Family history of ischemic heart disease and other diseases of the circulatory system: Secondary | ICD-10-CM

## 2016-06-10 DIAGNOSIS — Z885 Allergy status to narcotic agent status: Secondary | ICD-10-CM

## 2016-06-10 DIAGNOSIS — Z87891 Personal history of nicotine dependence: Secondary | ICD-10-CM | POA: Diagnosis not present

## 2016-06-10 DIAGNOSIS — E119 Type 2 diabetes mellitus without complications: Secondary | ICD-10-CM | POA: Diagnosis present

## 2016-06-10 DIAGNOSIS — M25562 Pain in left knee: Secondary | ICD-10-CM | POA: Diagnosis present

## 2016-06-10 DIAGNOSIS — R262 Difficulty in walking, not elsewhere classified: Secondary | ICD-10-CM

## 2016-06-10 DIAGNOSIS — Z96651 Presence of right artificial knee joint: Secondary | ICD-10-CM | POA: Diagnosis present

## 2016-06-10 DIAGNOSIS — E662 Morbid (severe) obesity with alveolar hypoventilation: Secondary | ICD-10-CM | POA: Diagnosis present

## 2016-06-10 DIAGNOSIS — Z880 Allergy status to penicillin: Secondary | ICD-10-CM | POA: Diagnosis not present

## 2016-06-10 DIAGNOSIS — T84033A Mechanical loosening of internal left knee prosthetic joint, initial encounter: Principal | ICD-10-CM | POA: Diagnosis present

## 2016-06-10 DIAGNOSIS — K219 Gastro-esophageal reflux disease without esophagitis: Secondary | ICD-10-CM | POA: Diagnosis present

## 2016-06-10 DIAGNOSIS — Z96659 Presence of unspecified artificial knee joint: Secondary | ICD-10-CM

## 2016-06-10 HISTORY — PX: TOTAL KNEE REVISION: SHX996

## 2016-06-10 HISTORY — DX: Unspecified osteoarthritis, unspecified site: M19.90

## 2016-06-10 LAB — GLUCOSE, CAPILLARY
Glucose-Capillary: 148 mg/dL — ABNORMAL HIGH (ref 65–99)
Glucose-Capillary: 129 mg/dL — ABNORMAL HIGH (ref 65–99)
Glucose-Capillary: 165 mg/dL — ABNORMAL HIGH (ref 65–99)

## 2016-06-10 SURGERY — TOTAL KNEE REVISION
Anesthesia: Regional | Site: Knee | Laterality: Left

## 2016-06-10 MED ORDER — VANCOMYCIN HCL 1000 MG IV SOLR
INTRAVENOUS | Status: DC | PRN
  Administered 2016-06-10: 1500 mg via INTRAVENOUS

## 2016-06-10 MED ORDER — HYDROCODONE-ACETAMINOPHEN 10-325 MG PO TABS
1.0000 | ORAL_TABLET | ORAL | Status: DC | PRN
  Administered 2016-06-11 – 2016-06-12 (×4): 2 via ORAL
  Filled 2016-06-10 (×4): qty 2

## 2016-06-10 MED ORDER — FLEET ENEMA 7-19 GM/118ML RE ENEM: 1.0000 | ENEMA | Freq: Once | RECTAL | Status: DC | PRN

## 2016-06-10 MED ORDER — ALBUTEROL SULFATE (2.5 MG/3ML) 0.083% IN NEBU: 2.5000 mg | INHALATION_SOLUTION | Freq: Four times a day (QID) | RESPIRATORY_TRACT | Status: DC | PRN

## 2016-06-10 MED ORDER — MOMETASONE FURO-FORMOTEROL FUM 100-5 MCG/ACT IN AERO
2.0000 | INHALATION_SPRAY | Freq: Two times a day (BID) | RESPIRATORY_TRACT | Status: DC
  Administered 2016-06-10 – 2016-06-12 (×4): 2 via RESPIRATORY_TRACT
  Filled 2016-06-10: qty 8.8

## 2016-06-10 MED ORDER — CLOBETASOL PROPIONATE 0.05 % EX CREA
TOPICAL_CREAM | Freq: Two times a day (BID) | CUTANEOUS | Status: DC
  Administered 2016-06-10: 1 via TOPICAL
  Administered 2016-06-11 – 2016-06-12 (×3): via TOPICAL
  Filled 2016-06-10: qty 15

## 2016-06-10 MED ORDER — ONDANSETRON HCL 4 MG/2ML IJ SOLN
4.0000 mg | Freq: Four times a day (QID) | INTRAMUSCULAR | Status: DC | PRN
  Administered 2016-06-12: 4 mg via INTRAVENOUS
  Filled 2016-06-10: qty 2

## 2016-06-10 MED ORDER — CEFUROXIME SODIUM 1.5 G IJ SOLR
INTRAMUSCULAR | Status: DC | PRN
  Administered 2016-06-10: 1.5 g

## 2016-06-10 MED ORDER — SODIUM CHLORIDE 0.9 % IR SOLN
Status: DC | PRN
  Administered 2016-06-10: 3000 mL

## 2016-06-10 MED ORDER — COLESEVELAM HCL 625 MG PO TABS
1875.0000 mg | ORAL_TABLET | Freq: Three times a day (TID) | ORAL | Status: DC
  Administered 2016-06-10 – 2016-06-12 (×5): 1875 mg via ORAL
  Filled 2016-06-10 (×7): qty 3

## 2016-06-10 MED ORDER — LORATADINE 10 MG PO TABS
10.0000 mg | ORAL_TABLET | Freq: Every day | ORAL | Status: DC
  Administered 2016-06-10 – 2016-06-12 (×3): 10 mg via ORAL
  Filled 2016-06-10 (×3): qty 1

## 2016-06-10 MED ORDER — VALSARTAN-HYDROCHLOROTHIAZIDE 320-25 MG PO TABS: 1.0000 | ORAL_TABLET | Freq: Every day | ORAL | Status: DC

## 2016-06-10 MED ORDER — CELECOXIB 200 MG PO CAPS
200.0000 mg | ORAL_CAPSULE | Freq: Every day | ORAL | Status: DC
  Administered 2016-06-10 – 2016-06-12 (×3): 200 mg via ORAL
  Filled 2016-06-10 (×3): qty 1

## 2016-06-10 MED ORDER — CEFUROXIME SODIUM 1.5 G IJ SOLR
INTRAMUSCULAR | Status: AC
  Filled 2016-06-10: qty 1.5

## 2016-06-10 MED ORDER — DOCUSATE SODIUM 100 MG PO CAPS
100.0000 mg | ORAL_CAPSULE | Freq: Two times a day (BID) | ORAL | Status: DC
  Administered 2016-06-11 – 2016-06-12 (×3): 100 mg via ORAL
  Filled 2016-06-10 (×4): qty 1

## 2016-06-10 MED ORDER — FENTANYL CITRATE (PF) 100 MCG/2ML IJ SOLN
100.0000 ug | Freq: Once | INTRAMUSCULAR | Status: AC
  Administered 2016-06-10 (×3): 50 ug via INTRAVENOUS
  Administered 2016-06-10: 100 ug via INTRAVENOUS
  Administered 2016-06-10: 50 ug via INTRAVENOUS
  Administered 2016-06-10: 100 ug via INTRAVENOUS

## 2016-06-10 MED ORDER — TRAMADOL HCL 50 MG PO TABS
50.0000 mg | ORAL_TABLET | Freq: Four times a day (QID) | ORAL | Status: DC | PRN
  Administered 2016-06-11: 50 mg via ORAL
  Filled 2016-06-10: qty 1

## 2016-06-10 MED ORDER — PROMETHAZINE HCL 25 MG/ML IJ SOLN: 6.2500 mg | INTRAMUSCULAR | Status: DC | PRN

## 2016-06-10 MED ORDER — FENTANYL CITRATE (PF) 100 MCG/2ML IJ SOLN
INTRAMUSCULAR | Status: AC
  Filled 2016-06-10: qty 2

## 2016-06-10 MED ORDER — PHENOL 1.4 % MT LIQD: 1.0000 | OROMUCOSAL | Status: DC | PRN

## 2016-06-10 MED ORDER — BUPIVACAINE LIPOSOME 1.3 % IJ SUSP
INTRAMUSCULAR | Status: DC | PRN
  Administered 2016-06-10: 20 mL

## 2016-06-10 MED ORDER — OXYCODONE HCL 5 MG PO TABS
5.0000 mg | ORAL_TABLET | Freq: Once | ORAL | Status: AC | PRN
Start: 2016-06-10 — End: 2016-06-10
  Administered 2016-06-10: 5 mg via ORAL

## 2016-06-10 MED ORDER — SUGAMMADEX SODIUM 200 MG/2ML IV SOLN
INTRAVENOUS | Status: DC | PRN
  Administered 2016-06-10: 300 mg via INTRAVENOUS

## 2016-06-10 MED ORDER — FENTANYL CITRATE (PF) 100 MCG/2ML IJ SOLN
INTRAMUSCULAR | Status: AC
  Administered 2016-06-10: 50 ug
  Filled 2016-06-10: qty 2

## 2016-06-10 MED ORDER — ROCURONIUM BROMIDE 100 MG/10ML IV SOLN
INTRAVENOUS | Status: DC | PRN
  Administered 2016-06-10: 140 mg via INTRAVENOUS
  Administered 2016-06-10: 10 mg via INTRAVENOUS
  Administered 2016-06-10: 40 mg via INTRAVENOUS

## 2016-06-10 MED ORDER — PHENYLEPHRINE HCL 10 MG/ML IJ SOLN
INTRAVENOUS | Status: DC | PRN
  Administered 2016-06-10: 40 ug/min via INTRAVENOUS

## 2016-06-10 MED ORDER — ADULT MULTIVITAMIN W/MINERALS CH
1.0000 | ORAL_TABLET | Freq: Every day | ORAL | Status: DC
  Administered 2016-06-10 – 2016-06-12 (×3): 1 via ORAL
  Filled 2016-06-10 (×3): qty 1

## 2016-06-10 MED ORDER — FLUTICASONE PROPIONATE 50 MCG/ACT NA SUSP
1.0000 | Freq: Two times a day (BID) | NASAL | Status: DC | PRN
  Filled 2016-06-10: qty 16

## 2016-06-10 MED ORDER — HYDROCHLOROTHIAZIDE 25 MG PO TABS
25.0000 mg | ORAL_TABLET | Freq: Every day | ORAL | Status: DC
  Administered 2016-06-11 – 2016-06-12 (×2): 25 mg via ORAL
  Filled 2016-06-10 (×2): qty 1

## 2016-06-10 MED ORDER — ACETAMINOPHEN 10 MG/ML IV SOLN
1000.0000 mg | Freq: Once | INTRAVENOUS | Status: AC
  Administered 2016-06-10: 1000 mg via INTRAVENOUS

## 2016-06-10 MED ORDER — PANTOPRAZOLE SODIUM 40 MG PO TBEC
80.0000 mg | DELAYED_RELEASE_TABLET | Freq: Every day | ORAL | Status: DC
  Administered 2016-06-10 – 2016-06-12 (×3): 80 mg via ORAL
  Filled 2016-06-10 (×3): qty 2

## 2016-06-10 MED ORDER — BUPIVACAINE HCL (PF) 0.25 % IJ SOLN
INTRAMUSCULAR | Status: AC
  Filled 2016-06-10: qty 30

## 2016-06-10 MED ORDER — IRBESARTAN 300 MG PO TABS
300.0000 mg | ORAL_TABLET | Freq: Every day | ORAL | Status: DC
Start: 2016-06-10 — End: 2016-06-12
  Administered 2016-06-10 – 2016-06-12 (×3): 300 mg via ORAL
  Filled 2016-06-10 (×3): qty 1

## 2016-06-10 MED ORDER — FENTANYL CITRATE (PF) 100 MCG/2ML IJ SOLN
25.0000 ug | INTRAMUSCULAR | Status: DC | PRN
  Administered 2016-06-10: 50 ug via INTRAVENOUS

## 2016-06-10 MED ORDER — INSULIN ASPART 100 UNIT/ML ~~LOC~~ SOLN
0.0000 [IU] | Freq: Three times a day (TID) | SUBCUTANEOUS | Status: DC
  Administered 2016-06-11: 3 [IU] via SUBCUTANEOUS
  Administered 2016-06-11 (×2): 2 [IU] via SUBCUTANEOUS
  Administered 2016-06-12: 3 [IU] via SUBCUTANEOUS

## 2016-06-10 MED ORDER — LACTATED RINGERS IV SOLN
INTRAVENOUS | Status: DC
Start: 2016-06-10 — End: 2016-06-10
  Administered 2016-06-10 (×3): via INTRAVENOUS

## 2016-06-10 MED ORDER — METHOCARBAMOL 500 MG PO TABS
ORAL_TABLET | ORAL | Status: AC
  Filled 2016-06-10: qty 1

## 2016-06-10 MED ORDER — ONDANSETRON HCL 4 MG PO TABS: 4.0000 mg | ORAL_TABLET | Freq: Four times a day (QID) | ORAL | Status: DC | PRN

## 2016-06-10 MED ORDER — CLOBETASOL PROP EMOLLIENT BASE 0.05 % EX CREA: 1.0000 "application " | TOPICAL_CREAM | Freq: Two times a day (BID) | CUTANEOUS | Status: DC

## 2016-06-10 MED ORDER — DIPHENHYDRAMINE HCL 12.5 MG/5ML PO ELIX: 12.5000 mg | ORAL_SOLUTION | ORAL | Status: DC | PRN

## 2016-06-10 MED ORDER — KCL IN DEXTROSE-NACL 20-5-0.45 MEQ/L-%-% IV SOLN
INTRAVENOUS | Status: DC
  Administered 2016-06-11 – 2016-06-12 (×4): via INTRAVENOUS
  Filled 2016-06-10 (×5): qty 1000

## 2016-06-10 MED ORDER — ACETAMINOPHEN 650 MG RE SUPP: 650.0000 mg | Freq: Four times a day (QID) | RECTAL | Status: DC | PRN

## 2016-06-10 MED ORDER — ONDANSETRON HCL 4 MG/2ML IJ SOLN
INTRAMUSCULAR | Status: DC | PRN
  Administered 2016-06-10: 4 mg via INTRAVENOUS

## 2016-06-10 MED ORDER — FENTANYL CITRATE (PF) 100 MCG/2ML IJ SOLN
25.0000 ug | INTRAMUSCULAR | Status: DC | PRN
  Administered 2016-06-10 (×3): 50 ug via INTRAVENOUS

## 2016-06-10 MED ORDER — PHENYLEPHRINE HCL 10 MG/ML IJ SOLN
INTRAMUSCULAR | Status: DC | PRN
  Administered 2016-06-10 (×2): 100 ug via INTRAVENOUS

## 2016-06-10 MED ORDER — ACETAMINOPHEN 325 MG PO TABS: 650.0000 mg | ORAL_TABLET | Freq: Four times a day (QID) | ORAL | Status: DC | PRN

## 2016-06-10 MED ORDER — SENNOSIDES-DOCUSATE SODIUM 8.6-50 MG PO TABS: 1.0000 | ORAL_TABLET | Freq: Every evening | ORAL | Status: DC | PRN

## 2016-06-10 MED ORDER — SODIUM CHLORIDE 0.9 % IV SOLN
1000.0000 mg | Freq: Once | INTRAVENOUS | Status: AC
  Administered 2016-06-10: 1000 mg via INTRAVENOUS
  Filled 2016-06-10: qty 10

## 2016-06-10 MED ORDER — SODIUM CHLORIDE 0.9 % IJ SOLN
INTRAMUSCULAR | Status: DC | PRN
  Administered 2016-06-10: 50 mL

## 2016-06-10 MED ORDER — MAGNESIUM 400 MG PO CAPS: 400.0000 mg | ORAL_CAPSULE | Freq: Every evening | ORAL | Status: DC

## 2016-06-10 MED ORDER — MORPHINE SULFATE (PF) 2 MG/ML IV SOLN
2.0000 mg | INTRAVENOUS | Status: DC | PRN
  Administered 2016-06-10: 4 mg via INTRAVENOUS
  Administered 2016-06-10 (×2): 2 mg via INTRAVENOUS
  Administered 2016-06-11 – 2016-06-12 (×8): 4 mg via INTRAVENOUS
  Filled 2016-06-10 (×2): qty 2
  Filled 2016-06-10: qty 1
  Filled 2016-06-10 (×7): qty 2
  Filled 2016-06-10: qty 1

## 2016-06-10 MED ORDER — ALUM & MAG HYDROXIDE-SIMETH 200-200-20 MG/5ML PO SUSP: 30.0000 mL | ORAL | Status: DC | PRN

## 2016-06-10 MED ORDER — BUPIVACAINE LIPOSOME 1.3 % IJ SUSP
20.0000 mL | INTRAMUSCULAR | Status: DC
  Filled 2016-06-10: qty 20

## 2016-06-10 MED ORDER — PROPOFOL 10 MG/ML IV BOLUS
INTRAVENOUS | Status: AC
  Filled 2016-06-10: qty 20

## 2016-06-10 MED ORDER — ROPIVACAINE HCL 5 MG/ML IJ SOLN
INTRAMUSCULAR | Status: DC | PRN
  Administered 2016-06-10: 30 mL via PERINEURAL

## 2016-06-10 MED ORDER — METFORMIN HCL 500 MG PO TABS
500.0000 mg | ORAL_TABLET | Freq: Two times a day (BID) | ORAL | Status: DC
  Administered 2016-06-10 – 2016-06-12 (×4): 500 mg via ORAL
  Filled 2016-06-10 (×4): qty 1

## 2016-06-10 MED ORDER — LABETALOL HCL 5 MG/ML IV SOLN
10.0000 mg | INTRAVENOUS | Status: DC | PRN
  Administered 2016-06-10: 5 mg via INTRAVENOUS

## 2016-06-10 MED ORDER — ASPIRIN EC 325 MG PO TBEC
325.0000 mg | DELAYED_RELEASE_TABLET | Freq: Every day | ORAL | Status: DC
  Administered 2016-06-11 – 2016-06-12 (×2): 325 mg via ORAL
  Filled 2016-06-10 (×2): qty 1

## 2016-06-10 MED ORDER — BUPIVACAINE HCL (PF) 0.5 % IJ SOLN
INTRAMUSCULAR | Status: AC
  Filled 2016-06-10: qty 30

## 2016-06-10 MED ORDER — 0.9 % SODIUM CHLORIDE (POUR BTL) OPTIME
TOPICAL | Status: DC | PRN
  Administered 2016-06-10: 1000 mL

## 2016-06-10 MED ORDER — LABETALOL HCL 5 MG/ML IV SOLN
INTRAVENOUS | Status: AC
  Filled 2016-06-10: qty 4

## 2016-06-10 MED ORDER — VITAMIN B-12 1000 MCG PO TABS
2000.0000 ug | ORAL_TABLET | Freq: Every day | ORAL | Status: DC
  Administered 2016-06-10 – 2016-06-12 (×3): 2000 ug via ORAL
  Filled 2016-06-10 (×3): qty 2

## 2016-06-10 MED ORDER — EPINEPHRINE PF 1 MG/ML IJ SOLN
INTRAMUSCULAR | Status: AC
  Filled 2016-06-10: qty 1

## 2016-06-10 MED ORDER — TRANEXAMIC ACID 1000 MG/10ML IV SOLN
2000.0000 mg | INTRAVENOUS | Status: AC
  Administered 2016-06-10: 2000 mg via TOPICAL
  Filled 2016-06-10: qty 20

## 2016-06-10 MED ORDER — MIDAZOLAM HCL 2 MG/2ML IJ SOLN
INTRAMUSCULAR | Status: AC
  Administered 2016-06-10: 1 mg
  Filled 2016-06-10: qty 2

## 2016-06-10 MED ORDER — MORPHINE SULFATE ER 15 MG PO TBCR: 15.0000 mg | EXTENDED_RELEASE_TABLET | Freq: Two times a day (BID) | ORAL | Status: DC

## 2016-06-10 MED ORDER — METHOCARBAMOL 1000 MG/10ML IJ SOLN
500.0000 mg | Freq: Four times a day (QID) | INTRAVENOUS | Status: DC | PRN
  Filled 2016-06-10: qty 5

## 2016-06-10 MED ORDER — VITAMIN E 180 MG (400 UNIT) PO CAPS
400.0000 [IU] | ORAL_CAPSULE | Freq: Every day | ORAL | Status: DC
  Administered 2016-06-10 – 2016-06-12 (×3): 400 [IU] via ORAL
  Filled 2016-06-10 (×3): qty 1

## 2016-06-10 MED ORDER — METOCLOPRAMIDE HCL 5 MG PO TABS: 5.0000 mg | ORAL_TABLET | Freq: Three times a day (TID) | ORAL | Status: DC | PRN

## 2016-06-10 MED ORDER — MENTHOL 3 MG MT LOZG
1.0000 | LOZENGE | OROMUCOSAL | Status: DC | PRN
  Administered 2016-06-11: 3 mg via ORAL
  Filled 2016-06-10: qty 9

## 2016-06-10 MED ORDER — ALBUTEROL SULFATE HFA 108 (90 BASE) MCG/ACT IN AERS: 2.0000 | INHALATION_SPRAY | Freq: Four times a day (QID) | RESPIRATORY_TRACT | Status: DC | PRN

## 2016-06-10 MED ORDER — ACETAMINOPHEN 10 MG/ML IV SOLN
INTRAVENOUS | Status: AC
  Filled 2016-06-10: qty 100

## 2016-06-10 MED ORDER — PROPOFOL 10 MG/ML IV BOLUS
INTRAVENOUS | Status: DC | PRN
  Administered 2016-06-10: 200 mg via INTRAVENOUS

## 2016-06-10 MED ORDER — BISACODYL 5 MG PO TBEC: 5.0000 mg | DELAYED_RELEASE_TABLET | Freq: Every day | ORAL | Status: DC | PRN

## 2016-06-10 MED ORDER — SODIUM CHLORIDE 0.9 % IV SOLN
1000.0000 mg | INTRAVENOUS | Status: AC
  Administered 2016-06-10: 1000 mg via INTRAVENOUS
  Filled 2016-06-10: qty 10

## 2016-06-10 MED ORDER — FENTANYL CITRATE (PF) 100 MCG/2ML IJ SOLN
INTRAMUSCULAR | Status: AC
  Filled 2016-06-10: qty 4

## 2016-06-10 MED ORDER — MIDAZOLAM HCL 2 MG/2ML IJ SOLN: 2.0000 mg | Freq: Once | INTRAMUSCULAR | Status: DC

## 2016-06-10 MED ORDER — LIDOCAINE HCL (CARDIAC) 20 MG/ML IV SOLN
INTRAVENOUS | Status: DC | PRN
  Administered 2016-06-10: 80 mg via INTRAVENOUS

## 2016-06-10 MED ORDER — METHOCARBAMOL 500 MG PO TABS
500.0000 mg | ORAL_TABLET | Freq: Four times a day (QID) | ORAL | Status: DC | PRN
  Administered 2016-06-10 – 2016-06-11 (×2): 500 mg via ORAL
  Filled 2016-06-10 (×2): qty 1

## 2016-06-10 MED ORDER — OXYCODONE HCL 5 MG/5ML PO SOLN
5.0000 mg | Freq: Once | ORAL | Status: AC | PRN
Start: 2016-06-10 — End: 2016-06-10

## 2016-06-10 MED ORDER — OXYCODONE HCL 5 MG PO TABS
ORAL_TABLET | ORAL | Status: AC
  Filled 2016-06-10: qty 1

## 2016-06-10 MED ORDER — METOCLOPRAMIDE HCL 5 MG/ML IJ SOLN: 5.0000 mg | Freq: Three times a day (TID) | INTRAMUSCULAR | Status: DC | PRN

## 2016-06-10 MED ORDER — MAGNESIUM OXIDE 400 (241.3 MG) MG PO TABS
400.0000 mg | ORAL_TABLET | Freq: Every day | ORAL | Status: DC
  Administered 2016-06-11 – 2016-06-12 (×2): 400 mg via ORAL
  Filled 2016-06-10 (×3): qty 1

## 2016-06-10 MED ORDER — BUPIVACAINE-EPINEPHRINE (PF) 0.5% -1:200000 IJ SOLN
INTRAMUSCULAR | Status: DC | PRN
  Administered 2016-06-10: 25 mL

## 2016-06-10 SURGICAL SUPPLY — 84 items
ADAPTER BOLT FEMORAL +2/-2 (Knees) ×2 IMPLANT
BANDAGE ACE 4X5 VEL STRL LF (GAUZE/BANDAGES/DRESSINGS) IMPLANT
BANDAGE ACE 6X5 VEL STRL LF (GAUZE/BANDAGES/DRESSINGS) IMPLANT
BANDAGE ESMARK 6X9 LF (GAUZE/BANDAGES/DRESSINGS) ×1 IMPLANT
BLADE SAG 18X100X1.27 (BLADE) ×2 IMPLANT
BLADE SAW SAG 90X13X1.27 (BLADE) ×2 IMPLANT
BLADE SURG ROTATE 9660 (MISCELLANEOUS) IMPLANT
BNDG COHESIVE 6X5 TAN STRL LF (GAUZE/BANDAGES/DRESSINGS) ×2 IMPLANT
BNDG ELASTIC 6X10 VLCR STRL LF (GAUZE/BANDAGES/DRESSINGS) ×2 IMPLANT
BNDG ESMARK 6X9 LF (GAUZE/BANDAGES/DRESSINGS) ×2
BOWL SMART MIX CTS (DISPOSABLE) ×2 IMPLANT
BRUSH FEMORAL CANAL (MISCELLANEOUS) ×2 IMPLANT
CEMENT HV SMART SET (Cement) ×4 IMPLANT
COVER BACK TABLE 24X17X13 BIG (DRAPES) IMPLANT
COVER SURGICAL LIGHT HANDLE (MISCELLANEOUS) ×2 IMPLANT
CUFF TOURNIQUET SINGLE 34IN LL (TOURNIQUET CUFF) IMPLANT
CUFF TOURNIQUET SINGLE 44IN (TOURNIQUET CUFF) ×2 IMPLANT
DISC DIAMOND MED (BURR) IMPLANT
DRAPE HALF SHEET 40X57 (DRAPES) ×2 IMPLANT
DRAPE IMP U-DRAPE 54X76 (DRAPES) ×2 IMPLANT
DRAPE U-SHAPE 47X51 STRL (DRAPES) ×2 IMPLANT
DRSG AQUACEL AG ADV 3.5X14 (GAUZE/BANDAGES/DRESSINGS) ×2 IMPLANT
DURAPREP 26ML APPLICATOR (WOUND CARE) ×4 IMPLANT
ELECT REM PT RETURN 9FT ADLT (ELECTROSURGICAL) ×2
ELECTRODE REM PT RTRN 9FT ADLT (ELECTROSURGICAL) ×1 IMPLANT
EVACUATOR 1/8 PVC DRAIN (DRAIN) IMPLANT
FEM TC3 PFC SZ3 LEFT (Orthopedic Implant) ×2 IMPLANT
FEMORAL ADAPTER (Orthopedic Implant) ×2 IMPLANT
FEMORAL TC3 PFC SZ3 LEFT (Orthopedic Implant) ×1 IMPLANT
FILTER STRAW FLUID ASPIR (MISCELLANEOUS) ×4 IMPLANT
GAUZE SPONGE 4X4 12PLY STRL (GAUZE/BANDAGES/DRESSINGS) ×2 IMPLANT
GAUZE XEROFORM 1X8 LF (GAUZE/BANDAGES/DRESSINGS) IMPLANT
GLOVE BIO SURGEON STRL SZ7.5 (GLOVE) ×2 IMPLANT
GLOVE BIO SURGEON STRL SZ8.5 (GLOVE) ×4 IMPLANT
GLOVE BIOGEL PI IND STRL 8 (GLOVE) ×2 IMPLANT
GLOVE BIOGEL PI IND STRL 9 (GLOVE) ×1 IMPLANT
GLOVE BIOGEL PI INDICATOR 8 (GLOVE) ×2
GLOVE BIOGEL PI INDICATOR 9 (GLOVE) ×1
GOWN STRL REUS W/ TWL LRG LVL3 (GOWN DISPOSABLE) ×1 IMPLANT
GOWN STRL REUS W/ TWL XL LVL3 (GOWN DISPOSABLE) ×3 IMPLANT
GOWN STRL REUS W/TWL LRG LVL3 (GOWN DISPOSABLE) ×1
GOWN STRL REUS W/TWL XL LVL3 (GOWN DISPOSABLE) ×3
HANDPIECE INTERPULSE COAX TIP (DISPOSABLE) ×1
HOOD PEEL AWAY FACE SHEILD DIS (HOOD) ×6 IMPLANT
INSERT TIBIAL TC3 15.0 (Knees) ×2 IMPLANT
KIT BASIN OR (CUSTOM PROCEDURE TRAY) ×2 IMPLANT
KIT ROOM TURNOVER OR (KITS) ×2 IMPLANT
MANIFOLD NEPTUNE II (INSTRUMENTS) ×2 IMPLANT
NEEDLE 18GX1X1/2 (RX/OR ONLY) (NEEDLE) ×2 IMPLANT
NEEDLE 22X1 1/2 (OR ONLY) (NEEDLE) ×4 IMPLANT
NEEDLE SPNL 18GX3.5 QUINCKE PK (NEEDLE) ×2 IMPLANT
NS IRRIG 1000ML POUR BTL (IV SOLUTION) ×2 IMPLANT
PACK TOTAL JOINT (CUSTOM PROCEDURE TRAY) ×2 IMPLANT
PACK UNIVERSAL I (CUSTOM PROCEDURE TRAY) IMPLANT
PAD ARMBOARD 7.5X6 YLW CONV (MISCELLANEOUS) ×4 IMPLANT
PAD CAST 4YDX4 CTTN HI CHSV (CAST SUPPLIES) IMPLANT
PADDING CAST COTTON 4X4 STRL (CAST SUPPLIES)
PADDING CAST COTTON 6X4 STRL (CAST SUPPLIES) IMPLANT
RASP HELIOCORDIAL MED (MISCELLANEOUS) IMPLANT
SET HNDPC FAN SPRY TIP SCT (DISPOSABLE) ×1 IMPLANT
STAPLER VISISTAT 35W (STAPLE) IMPLANT
STEM  REV  115X14 (Stem) ×1 IMPLANT
STEM REV 115X14 (Stem) ×1 IMPLANT
STEM UNIV REV 115 X 6 FLUTED (Stem) ×2 IMPLANT
SUCTION FRAZIER HANDLE 10FR (MISCELLANEOUS) ×1
SUCTION TUBE FRAZIER 10FR DISP (MISCELLANEOUS) ×1 IMPLANT
SUT VIC AB 0 CT1 27 (SUTURE) ×2
SUT VIC AB 0 CT1 27XBRD ANBCTR (SUTURE) ×2 IMPLANT
SUT VIC AB 1 CTX 36 (SUTURE) ×2
SUT VIC AB 1 CTX36XBRD ANBCTR (SUTURE) ×2 IMPLANT
SUT VIC AB 2-0 CT1 27 (SUTURE) ×1
SUT VIC AB 2-0 CT1 TAPERPNT 27 (SUTURE) ×1 IMPLANT
SUT VIC AB 3-0 CT1 27 (SUTURE) ×1
SUT VIC AB 3-0 CT1 TAPERPNT 27 (SUTURE) ×1 IMPLANT
SUT VIC AB 3-0 FS2 27 (SUTURE) IMPLANT
SYR 50ML LL SCALE MARK (SYRINGE) ×2 IMPLANT
SYR CONTROL 10ML LL (SYRINGE) ×4 IMPLANT
SYR TB 1ML LUER SLIP (SYRINGE) ×2 IMPLANT
TOWEL OR 17X24 6PK STRL BLUE (TOWEL DISPOSABLE) ×2 IMPLANT
TOWEL OR 17X26 10 PK STRL BLUE (TOWEL DISPOSABLE) ×2 IMPLANT
TRAY FOLEY CATH 14FR (SET/KITS/TRAYS/PACK) IMPLANT
TRAY REVISION SZ 4 (Knees) ×2 IMPLANT
TUBE ANAEROBIC SPECIMEN COL (MISCELLANEOUS) IMPLANT
WATER STERILE IRR 1000ML POUR (IV SOLUTION) IMPLANT

## 2016-06-10 NOTE — Progress Notes (Signed)
Orthopedic Tech Progress Note Patient Details:  Breanna FurnaceMary B Ross [birth year] [id number]  Ortho Devices Type of Ortho Device:  (footsie roll) Ortho Device/Splint Location: lle Ortho Device/Splint Interventions: Application   Kyshon Tolliver 06/10/2016, 4:28 PM

## 2016-06-10 NOTE — Anesthesia Procedure Notes (Addendum)
Anesthesia Regional Block:  Adductor canal block  Pre-Anesthetic Checklist: ,, timeout performed, Correct Patient, Correct Site, Correct Laterality, Correct Procedure, Correct Position, site marked, Risks and benefits discussed,  Surgical consent,  Pre-op evaluation,  At surgeon's request and post-op pain management  Laterality: Left  Prep: chloraprep       Needles:   Needle Type: Echogenic Needle     Needle Length: 5cm 5 cm Needle Gauge: 22 and 22 G    Additional Needles:  Procedures: ultrasound guided (picture in chart) Adductor canal block Narrative:  Start time: 06/10/2016 10:44 AM End time: 06/10/2016 10:46 AM Injection made incrementally with aspirations every 30 mL.  Performed by: Personally  Anesthesiologist: Bonita QuinGUIDETTI, Breanna Schulke S  Additional Notes: Patient tolerated procedure well.

## 2016-06-10 NOTE — Anesthesia Procedure Notes (Signed)
Procedure Name: Intubation Date/Time: 06/10/2016 12:13 PM Performed by: Tillman AbideHAWKINS, Kerith Sherley B Pre-anesthesia Checklist: Patient identified, Emergency Drugs available, Suction available and Patient being monitored Patient Re-evaluated:Patient Re-evaluated prior to inductionOxygen Delivery Method: Circle System Utilized Preoxygenation: Pre-oxygenation with 100% oxygen Intubation Type: IV induction Ventilation: Mask ventilation without difficulty Laryngoscope Size: Mac and 4 Grade View: Grade II Tube type: Oral Tube size: 7.5 mm Number of attempts: 2 Airway Equipment and Method: Stylet and Patient positioned with wedge pillow Placement Confirmation: ETT inserted through vocal cords under direct vision,  positive ETCO2 and breath sounds checked- equal and bilateral Secured at: 23 cm Tube secured with: Tape Dental Injury: Teeth and Oropharynx as per pre-operative assessment

## 2016-06-10 NOTE — Transfer of Care (Signed)
Immediate Anesthesia Transfer of Care Note  Patient: Breanna FurnaceMary B Ross  Procedure(s) Performed: Procedure(s): TOTAL KNEE REVISION (Left)  Patient Location: PACU  Anesthesia Type:General and GA combined with regional for post-op pain  Level of Consciousness: awake, alert , oriented and patient cooperative  Airway & Oxygen Therapy: Patient Spontanous Breathing and Patient connected to nasal cannula oxygen  Post-op Assessment: Report given to RN, Post -op Vital signs reviewed and stable and Patient moving all extremities  Post vital signs: Reviewed and stable  Last Vitals:  Vitals:   06/10/16 0932 06/10/16 1525  BP: (!) 154/95   Pulse: (!) 102   Resp: 18   Temp: 36.9 C (P) 36.4 C    Last Pain:  Vitals:   06/10/16 1525  TempSrc:   PainSc: (P) 10-Worst pain ever      Patients Stated Pain Goal: 2 (06/10/16 1027)  Complications: No apparent anesthesia complications

## 2016-06-10 NOTE — Anesthesia Procedure Notes (Deleted)
Performed by: Connie Hilgert B       

## 2016-06-10 NOTE — Op Note (Signed)
PATIENT ID:      Breanna Ross  MRN:     [id number] DOB/AGE:    [birth year] / 64 y.o.       OPERATIVE REPORT    DATE OF PROCEDURE:  06/10/2016       PREOPERATIVE DIAGNOSIS:   LOOSE LEFT TOTAL KNEE ARTHROPLASTY      Estimated body mass index is 47.85 kg/m as calculated from the following:   Height as of 05/31/16: 5' 6.5" (1.689 m).   Weight as of this encounter: 136.5 kg (301 lb).                                                        POSTOPERATIVE DIAGNOSIS:   LOOSE LEFT TOTAL KNEE ARTHROPLASTY ,Femoral and tibial components.                                                                   PROCEDURE:  Removal of looseleft total knee femoral and tibial components, followed by revision to a 3.  Left T C3 femur with a 115 mm x 14 mm femoral stem,On the tibial side a #4 MBT tray with 115 mm x 14 mm tibial stem, a 15 mm T C3 RP bearing.   SURGEON: Gean BirchwoodOWAN,Reneta Niehaus J    ASSISTANT:   Tomi LikensEric K. Gaylene BrooksPhillips PA-C   (Present and scrubbed throughout the case, critical for assistance with exposure, retraction, instrumentation, and closure.)         ANESTHESIA: General endotracheal , Exparel  EBL: 500 cc  FLUID REPLACEMENT: 1800 cc crystalloid TOURNIQUET TIME: 16 min  DRAINS: none  Tranexamic Acid: 11 g IV, 2 g topical  COMPLICATIONS:  None        INDICATIONS FOR PROCEDURE: The patient has  LOOSE LEFT TOTAL KNEE ARTHROPLAST. Primary left total knee was done in 2014 using  DePuy Sigma RP components.  Over the last year.  The patient's had progressively increasing pain with varus deformity.  X-rays show medial subsidence of the femoral component.  Bone scan shows increased uptake along the medial femoral condyle.  The tibial component has mildly increased uptake and no radiographic changes.  The patellar component also has no evidence of shift on x-rays.  Because of increasing pain and trouble with ambulation.  Patient desires elective revision of her left total knee arthroplasty.  Risks and benefits of surgery  have been discussed, questions answered.   DESCRIPTION OF PROCEDURE: The patient identified by armband, received  IV antibiotics, [facility]. Patient taken to the operating room, appropriate anesthetic monitors were attached, and general endotracheal anesthesia induced with the patient in supine position. Tourniquet applied high to the operative thigh. Lateral post and foot positioner applied to the table, the lower extremity was then prepped and draped in usual sterile fashion from the ankle to the tourniquet. Time-out procedure was performed.  We began the operation with the knee flexed 100 degrees, by re-creating the old anterior midline incision  starting at handbreadth above the patella going over the patella 1 cm medial to and 6 cm distal to  the tibial tubercle. Small bleeders in the skin and the subcutaneous tissue identified and cauterized. Transverse retinaculum was incised and reflected medially and a medial parapatellar arthrotomy was accomplished. the patella was everted and the prepatellar fat pad resected. The superficial medial collateral ligament was then elevated from anterior to posterior along the proximal flare of the tibia. We immediately noted thickened synovium throughout the knee with bits of polyethylene and cement in the synovium.  A thorough synovectomy of all compartments was accomplished. We continued to work our way around posteriorly along the proximal tibia, and externally rotated the tibia subluxing it out from underneath the femur. A McHale retractor was placed through the notch and a lateral Hohmann retractor placed, Were removed.  Hypertrophic bone from around the tibial plate and then tapped it with a 1/2 inch osteotome and was noted to be loose, delaminated from the cement and was removed.  This man itself appeared to be well fixed to the proximal tibia and at this point, was left alone.  Using the DePuy starter drill was then drilled through the  cement that was at the tip of the old tibial component, and sequentially reamed up to a14 mm reamer to the appropriate depth for 115 mm stem. With the 14 mm reamer in place, we then brought up the  MBT cutting guide set it at a total of 5, pinned in place to remove a thin wafer of cement and bone and performed aminimal resection of the proximal tibia. We then sized for a #4 RP trial baseplate and pinned this in place, followed by reaming with the conical reamer and followed it by reaming with the conical reamer and a 14 mm stem.After this was accomplished, a trial 4 MBT plate with a 14 mm stem was inserted.  The delta fin slots were opened with a small oscillating saw followed by the delta fin trial.  We then directed our attention to the distal femur.  The femoral component was noted to be grossly loose and removed with a slap hammer.  After removing bone from around the edges.  On the medial side.  There was loss of about 4-5 mm of bone with fibrous tissue and all the fibrous tissue is removed.  On the lateral side.  The bone was actually in relatively good condition.  We then entered the distal femur with a starter reamer followed by axial reaming up to a 14 mm  Axial reamer.  We placed a 14 mm collar around the reamer. The distal femoral cutting guide was set at 5 left and placed over the femoral reamer, pinned along the epicondylar axis to allow a 1 mm bony resection on the lateral side and essentially no resection on the medial side except for the bone that was protruding outside of the old 2.5 femoral component that had been removed. We then removed the femoral cutting guide and sized for a #3, TC 3 chamfer cutting guide which was placed over the intramedullary rodand pinned into place.  We removed a minimal amount of bone from the anterior aspect of the femur.  The chamfer cuts, removed little if any bone in the posterior cuts removed.  No bone.  We then removed the chamfer cutting guide and brought up the  TC 3 notch cutting guide which was pinned into place.  We then removed the 14 mm reamer.  The TC 3 notch cut was accomplished.  We assembled a trial with a #3.  Left T C3 femoral component  and a 14 mm x 115 mmfemoral stem.  This was hammered into place and fit nicely.We then inserted 12.5 and 15 mm T C3 RP bearings and found excellent stability with a 15 mm trialin full extension and 45 and 90 of flexion.  The patellar component, which was not loosed tracked normally. A TXA.  Sponge was inserted into the wound..  The limb was wrapped with an Esmarch bandage flexed to 90 and a tourniquet inflated to 350 mmHg.  At this point, all trial components were removed. Bone mating surfaces,underwent debridement with curet and rongeurs, removing any fibrous or soft tissues. We then  irrigated with pulse lavage, dried with suction and sponges. Exparel was then placed on the mating surfaces of the bone allowed to sit for 1-2 minutes and then dried.A double batch of DePuy HV cement with 1500 mg of Zinacef was mixed and applied to all bony metallic mating surfaces except for the posterior condyles of the femur itself. In order, we hammered into place the A #4 MBT tray with a 14 mm x 115 mm stem and removed excess cement, the femoral component A #3 left MBT  With a 14 mm x 115 mm stem, removed excess cement, a 15 mm TC3 RP bearing was inserted, and the knee brought to full extension with compression. While the cement cured the wound was irrigated out with normal saline solution pulse lavage. Ligament stability and patellar tracking were checked and found to be excellent. The tourniquet was let down,  bleeders were identified and cauterized.The parapatellar arthrotomy was closed with running #1 Vicryl suture. The subcutaneous tissue with 0 and 2-0 undyed Vicryl suture, and the skin with3-0 vicryl.  A dressing of Xeroform, 4 x 4, dressing sponges, Webril, and Ace wrap applied. The patient awakened, extubated, and taken to recovery  room without difficulty.   Trevaris Pennella J 06/10/2016, 5:30 PM

## 2016-06-10 NOTE — Progress Notes (Signed)
Pt admitted to the unit and room from pacu; pt A&O x4; IV intact and transfusing; VSS; pt left knee has clean dry ace wrap dsg; neuro check wnl; skin intact with not pressure ulcer or other wounds noted. Pt oriented to the unit and room; call light within reach; fall/safety precaution and prevention education completed with pt. Will closely monitor. Dionne BucyP. Amo Jaeleen Inzunza RN

## 2016-06-10 NOTE — Anesthesia Preprocedure Evaluation (Addendum)
Anesthesia Evaluation  Patient identified by MRN, date of birth, ID band Patient awake    Reviewed: Allergy & Precautions, NPO status , Patient's Chart, lab work & pertinent test results  History of Anesthesia Complications Negative for: history of anesthetic complications  Airway Mallampati: II  TM Distance: >3 FB Neck ROM: Full    Dental  (+) Teeth Intact, Dental Advisory Given   Pulmonary shortness of breath and with exertion, asthma , former smoker,    breath sounds clear to auscultation       Cardiovascular hypertension, Pt. on medications  Rhythm:Regular Rate:Normal     Neuro/Psych    GI/Hepatic GERD  Medicated and Controlled,  Endo/Other  diabetes, Type 2, Oral Hypoglycemic AgentsMorbid obesity  Renal/GU      Musculoskeletal  (+) Arthritis ,   Abdominal   Peds  Hematology   Anesthesia Other Findings   Reproductive/Obstetrics                             Anesthesia Physical  Anesthesia Plan  ASA: III  Anesthesia Plan: General and Regional   Post-op Pain Management:    Induction:   Airway Management Planned: Oral ETT  Additional Equipment:   Intra-op Plan:   Post-operative Plan: Extubation in OR  Informed Consent: I have reviewed the patients History and Physical, chart, labs and discussed the procedure including the risks, benefits and alternatives for the proposed anesthesia with the patient or authorized representative who has indicated his/her understanding and acceptance.   Dental advisory given  Plan Discussed with: CRNA and Anesthesiologist  Anesthesia Plan Comments:        Anesthesia Quick Evaluation

## 2016-06-10 NOTE — Discharge Instructions (Signed)

## 2016-06-10 NOTE — Interval H&P Note (Signed)
History and Physical Interval Note:  06/10/2016 9:46 AM  Breanna Ross  has presented today for surgery, with the diagnosis of LOOSE LEFT TOTAL KNEE ARTHROPLASTY  The various methods of treatment have been discussed with the patient and family. After consideration of risks, benefits and other options for treatment, the patient has consented to  Procedure(s): TOTAL KNEE REVISION (Left) as a surgical intervention .  The patient's history has been reviewed, patient examined, no change in status, stable for surgery.  I have reviewed the patient's chart and labs.  Questions were answered to the patient's satisfaction.     Nestor LewandowskyOWAN,Zuriel Yeaman J

## 2016-06-10 NOTE — OR Nursing (Signed)
No foreign body found in knee per Radiologist Dr. Chilton SiGreen

## 2016-06-11 ENCOUNTER — Encounter (HOSPITAL_COMMUNITY): Payer: Self-pay

## 2016-06-11 LAB — GLUCOSE, CAPILLARY
Glucose-Capillary: 161 mg/dL — ABNORMAL HIGH (ref 65–99)
Glucose-Capillary: 147 mg/dL — ABNORMAL HIGH (ref 65–99)
Glucose-Capillary: 140 mg/dL — ABNORMAL HIGH (ref 65–99)
Glucose-Capillary: 162 mg/dL — ABNORMAL HIGH (ref 65–99)

## 2016-06-11 LAB — BASIC METABOLIC PANEL
Anion gap: 8 (ref 5–15)
BUN: 11 mg/dL (ref 6–20)
CO2: 28 mmol/L (ref 22–32)
Calcium: 8.7 mg/dL — ABNORMAL LOW (ref 8.9–10.3)
Chloride: 100 mmol/L — ABNORMAL LOW (ref 101–111)
Creatinine, Ser: 0.87 mg/dL (ref 0.44–1.00)
GFR calc Af Amer: >60 mL/min (ref >60)
GFR calc non Af Amer: >60 mL/min (ref >60)
Glucose, Bld: 172 mg/dL — ABNORMAL HIGH (ref 65–99)
Potassium: 3.9 mmol/L (ref 3.5–5.1)
Sodium: 136 mmol/L (ref 135–145)

## 2016-06-11 LAB — CBC
HCT: 35.1 % — ABNORMAL LOW (ref 36.0–46.0)
Hemoglobin: 11.3 g/dL — ABNORMAL LOW (ref 12.0–15.0)
MCH: 27.5 pg (ref 26.0–34.0)
MCHC: 32.2 g/dL (ref 30.0–36.0)
MCV: 85.4 fL (ref 78.0–100.0)
Platelets: 291 10*3/uL (ref 150–400)
RBC: 4.11 MIL/uL (ref 3.87–5.11)
RDW: 14.7 % (ref 11.5–15.5)
WBC: 12.3 10*3/uL — ABNORMAL HIGH (ref 4.0–10.5)

## 2016-06-11 LAB — HEMOGLOBIN A1C
Hgb A1c MFr Bld: 6.8 % — ABNORMAL HIGH (ref 4.8–5.6)
Mean Plasma Glucose: 148 mg/dL

## 2016-06-11 NOTE — Evaluation (Signed)
Physical Therapy Evaluation Patient Details Name: Breanna FurnaceMary B Eddie MRN: [id number] DOB: 04-12-1951 Today's Date: 06/11/2016   History of Present Illness  Patient is a 65 y/o female admitted for L TK revision PMH positive for HTN, DM and GERD.  Clinical Impression  Patient presents with decreased mobility due to deficits listed in PT problem list.  She will benefit from skilled PT in the acute setting to allow return home with family support and follow up HHPT.     Follow Up Recommendations Home health PT;Supervision/Assistance - 24 hour    Equipment Recommendations  3in1 (PT)    Recommendations for Other Services       Precautions / Restrictions Precautions Precautions: Knee;Fall Restrictions LLE Weight Bearing: Weight bearing as tolerated      Mobility  Bed Mobility Overal bed mobility: Needs Assistance Bed Mobility: Supine to Sit     Supine to sit: Min assist     General bed mobility comments: for L LE and scooting forward  Transfers Overall transfer level: Needs assistance Equipment used: Rolling walker (2 wheeled) Transfers: Sit to/from Stand Sit to Stand: Mod assist         General transfer comment: lifting help from EOB and 3:1  Ambulation/Gait Ambulation/Gait assistance: Min guard;Min assist Ambulation Distance (Feet): 12 Feet (x 2) Assistive device: Rolling walker (2 wheeled) Gait Pattern/deviations: Step-to pattern;Decreased stride length;Antalgic;Trunk flexed     General Gait Details: leans heavily on walker and demonstrates limited tolerance to weight L LE; mod cues for sequence  Stairs            Wheelchair Mobility    Modified Rankin (Stroke Patients Only)       Balance Overall balance assessment: Needs assistance         Standing balance support: During functional activity;Single extremity supported Standing balance-Leahy Scale: Poor Standing balance comment: minguard for safety while performing toilet hygiene with on UE  support on walker                             Pertinent Vitals/Pain Pain Assessment: 0-10 Pain Score: 8  Pain Location: L knee Pain Descriptors / Indicators: Aching;Operative site guarding Pain Intervention(s): Monitored during session;Patient requesting pain meds-RN notified    Home Living Family/patient expects to be discharged to:: Private residence Living Arrangements: Spouse/significant other Available Help at Discharge: Family;Available 24 hours/day Type of Home: House Home Access: Stairs to enter Entrance Stairs-Rails: None Entrance Stairs-Number of Steps: 3 Home Layout: One level Home Equipment: Walker - 2 wheels;Cane - single point Additional Comments: states needs BSC     Prior Function Level of Independence: Independent               Hand Dominance   Dominant Hand: Right    Extremity/Trunk Assessment   Upper Extremity Assessment: Overall WFL for tasks assessed           Lower Extremity Assessment: LLE deficits/detail   LLE Deficits / Details: Knee flexion about 35-40 AAROM, active knee extension 3-/5, ankle WFL     Communication   Communication: No difficulties  Cognition Arousal/Alertness: Awake/alert Behavior During Therapy: WFL for tasks assessed/performed Overall Cognitive Status: Within Functional Limits for tasks assessed                      General Comments      Exercises Total Joint Exercises Ankle Circles/Pumps: AROM;Both;10 reps;Supine Quad Sets: AROM;Left;5 reps;Supine Heel Slides: AAROM;Left;5 reps;Supine  Assessment/Plan    PT Assessment Patient needs continued PT services  PT Problem List Decreased strength;Decreased balance;Decreased mobility;Decreased knowledge of use of DME;Pain;Decreased range of motion;Decreased activity tolerance          PT Treatment Interventions DME instruction;Gait training;Therapeutic activities;Therapeutic exercise;Patient/family education;Balance training;Functional  mobility training    PT Goals (Current goals can be found in the Care Plan section)  Acute Rehab PT Goals Patient Stated Goal: To return home PT Goal Formulation: With patient/family Time For Goal Achievement: 06/18/16 Potential to Achieve Goals: Good    Frequency 7X/week   Barriers to discharge        Co-evaluation               End of Session Equipment Utilized During Treatment: Gait belt Activity Tolerance: Patient limited by pain Patient left: in chair;with call bell/phone within reach           Time: 1120-1152 PT Time Calculation (min) (ACUTE ONLY): 32 min   Charges:   PT Evaluation $PT Eval Moderate Complexity: 1 Procedure PT Treatments $Gait Training: 8-22 mins   PT G Codes:        Elray McgregorCynthia Payton Prinsen 06/11/2016, 1:25 PM  Sheran Lawlessyndi Krishav Mamone, PT [phone] 06/11/2016

## 2016-06-11 NOTE — Progress Notes (Signed)
PATIENT ID: Breanna Ross  MRN: [id number]  DOB/AGE:  1950/08/19 / [age] y.o.  1 Day Post-Op Procedure(s) (LRB): TOTAL KNEE REVISION (Left)    PROGRESS NOTE Subjective: Patient is alert, oriented, no Nausea, no Vomiting, yes passing gas. Taking PO well. Denies SOB, Chest or Calf Pain. Using Incentive Spirometer, PAS in place. Ambulate WBAT,no CPM Patient reports pain as 8/10 .    Objective: Vital signs in last 24 hours: Vitals:   06/10/16 1730 06/10/16 2024 06/11/16 0105 06/11/16 0509  BP:  (!) 130/96 114/74 (!) 114/51  Pulse: (!) 101 100 (!) 105 (!) 110  Resp:  18 16 18   Temp: 97.6 F (36.4 C) 98.7 F (37.1 C) 99.5 F (37.5 C) 98.5 F (36.9 C)  TempSrc:  Oral Oral Oral  SpO2: 100% 97% 98% 100%  Weight:          Intake/Output from previous day: I/O last 3 completed shifts: In: 1381.7 [I.V.:1381.7] Out: 1000 [Urine:700; Blood:300]   Intake/Output this shift: No intake/output data recorded.   LABORATORY DATA:  Recent Labs  06/10/16 1528 06/10/16 2111 06/11/16 0433 06/11/16 0734  WBC  --   --  12.3*  --   HGB  --   --  11.3*  --   HCT  --   --  35.1*  --   PLT  --   --  291  --   NA  --   --  136  --   K  --   --  3.9  --   CL  --   --  100*  --   CO2  --   --  28  --   BUN  --   --  11  --   CREATININE  --   --  0.87  --   GLUCOSE  --   --  172*  --   GLUCAP 129* 165*  --  161*  CALCIUM  --   --  8.7*  --     Examination: Neurologically intact ABD soft Neurovascular intact Sensation intact distally Intact pulses distally Dorsiflexion/Plantar flexion intact Incision: scant drainage No cellulitis present Compartment soft}  Assessment:   1 Day Post-Op Procedure(s) (LRB): TOTAL KNEE REVISION (Left) ADDITIONAL DIAGNOSIS: Expected Acute Blood Loss Anemia, Diabetes and Hypertension, chronic pain  Plan: PT/OT WBAT, no cpm DVT Prophylaxis:  SCDx72hrs, ASA 325 mg BID x 2 weeks DISCHARGE PLAN: Home DISCHARGE NEEDS: HHPT, Walker and 3-in-1 comode seat     Breanna Ross J 06/11/2016, 7:47 AM Patient ID: Breanna Ross, female   DOB: 1950/08/19, 65 y.o.   MRN: [id number]

## 2016-06-11 NOTE — Progress Notes (Signed)
Physical Therapy Treatment Patient Details Name: Breanna Ross MRN: [id number] DOB: 1951/05/21 Today's Date: 06/11/2016    History of Present Illness Patient is a 65 y/o female admitted for L TK revision PMH positive for HTN, DM and GERD.    PT Comments    Patient able to tolerate increased distance with ambulation this session with encouragement and able to perform seated knee extension.  Will continue to benefit from acute skilled PT until d/c with HHPT.   Follow Up Recommendations  Home health PT;Supervision/Assistance - 24 hour     Equipment Recommendations  3in1 (PT)    Recommendations for Other Services       Precautions / Restrictions Precautions Precautions: Knee;Fall Precaution Comments: reviewed no pillow behind knee Restrictions Weight Bearing Restrictions: Yes LLE Weight Bearing: Weight bearing as tolerated    Mobility  Bed Mobility Overal bed mobility: Needs Assistance Bed Mobility: Sit to Supine     Supine to sit: Min assist Sit to supine: Min assist   General bed mobility comments: assist for L LE  Transfers Overall transfer level: Needs assistance Equipment used: Rolling walker (2 wheeled) Transfers: Sit to/from Stand Sit to Stand: Mod assist         General transfer comment: lifting help from EOB  Ambulation/Gait Ambulation/Gait assistance: Min guard;Supervision Ambulation Distance (Feet): 50 Feet Assistive device: Rolling walker (2 wheeled) Gait Pattern/deviations: Step-to pattern;Trunk flexed;Decreased stride length;Shuffle;Antalgic     General Gait Details: slow pace with stops to rest her arms at times, lowered walker for height, encouragement for distance this session    Stairs            Wheelchair Mobility    Modified Rankin (Stroke Patients Only)       Balance Overall balance assessment: Needs assistance Sitting-balance support: No upper extremity supported;Feet supported Sitting balance-Leahy Scale: Good      Standing balance support: Bilateral upper extremity supported Standing balance-Leahy Scale: Poor Standing balance comment: UE support for balance                    Cognition Arousal/Alertness: Awake/alert Behavior During Therapy: WFL for tasks assessed/performed Overall Cognitive Status: Within Functional Limits for tasks assessed                      Exercises Total Joint Exercises  Heel Slides: AAROM;Left;Supine;10 reps Long Arc Quad: AROM;Left;10 reps;Seated    General Comments General comments (skin integrity, edema, etc.): Pt's husband in room for entire session      Pertinent Vitals/Pain Pain Assessment: Faces Faces Pain Scale: Hurts whole lot Pain Location: L knee with movement Pain Descriptors / Indicators: Discomfort;Grimacing;Guarding Pain Intervention(s): Monitored during session;Repositioned;Ice applied    Home Living Family/patient expects to be discharged to:: Private residence Living Arrangements: Spouse/significant other Available Help at Discharge: Family;Available 24 hours/day Type of Home: House Home Access: Stairs to enter Entrance Stairs-Rails: None Home Layout: One level Home Equipment: Environmental consultantWalker - 2 wheels;Cane - single point Additional Comments: states needs BSC     Prior Function Level of Independence: Independent      Comments: had been working until 1 month ago and pain got to be too much, but still doing her own ADL. Pt states    PT Goals (current goals can now be found in the care plan section) Acute Rehab PT Goals Patient Stated Goal: To return home PT Goal Formulation: With patient/family Time For Goal Achievement: 06/18/16 Potential to Achieve Goals: Good Progress towards PT goals:  Progressing toward goals    Frequency    7X/week      PT Plan Current plan remains appropriate    Co-evaluation             End of Session Equipment Utilized During Treatment: Gait belt Activity Tolerance: Patient limited  by pain Patient left: in bed;with call bell/phone within reach;with family/visitor present     Time: [id number] PT Time Calculation (min) (ACUTE ONLY): 23 min  Charges:  $Gait Training: 8-22 mins $Therapeutic Activity: 8-22 mins                    G Codes:      Elray McgregorCynthia Addisyn Leclaire 06/11/2016, 5:03 PM  Sheran Lawlessyndi Lerone Onder, PT [phone] 06/11/2016

## 2016-06-11 NOTE — Care Management Note (Signed)
Case Management Note  Patient Details  Name: Breanna Ross MRN: [id number] Date of Birth: 01-Nov-1950  Subjective/Objective:   65 yr old female s/p left total knee arthroplasty.                 Action/Plan: Patient was preoperatively setup with [facility], no changes. Will have family support at discharge.    Expected Discharge Date:   06/12/16               Expected Discharge Plan:  Home w Home Health Services  In-House Referral:     Discharge planning Services  CM Consult  Post Acute Care Choice:  Home Health Choice offered to:  Patient  DME Arranged:  3-N-1 DME Agency:  [employer]  HH Arranged:  PT HH Agency:  [facility]  Status of Service:  Completed, signed off  If discussed at Long Length of Stay Meetings, dates discussed:    Additional Comments:  Breanna Ross, Breanna Heikkila [name], RN 06/11/2016, 2:15 PM

## 2016-06-11 NOTE — Evaluation (Signed)
Occupational Therapy Evaluation Patient Details Name: Breanna FurnaceMary B Corter MRN: [id number] DOB: Sep 28, 1950 Today's Date: 06/11/2016    History of Present Illness Patient is a 65 y/o female admitted for L TK revision PMH positive for HTN, DM and GERD.   Clinical Impression   PTA Pt was independent in ADL. Pt currently mod assist for ADL, mod assist for transfers, and min guard assist for ambulation with RW. Pt willing to work with therapy despite 9/10 pain this session. Please see list below. Pt will benefit from skilled OT in the acute setting prior to venue below to maximize independence in ADL. Pt expressed interest in listening to music next session, and session should focus on AE education/shower transfer. Pt requires bariatric 3 in 1.    Follow Up Recommendations  No OT follow up;Supervision/Assistance - 24 hour    Equipment Recommendations  3 in 1 bedside comode;Other (comment) (bariatric 3 in 1 needed )    Recommendations for Other Services       Precautions / Restrictions Precautions Precautions: Knee;Fall Precaution Comments: reviewed no pillow behind knee Restrictions Weight Bearing Restrictions: Yes LLE Weight Bearing: Weight bearing as tolerated      Mobility Bed Mobility Overal bed mobility: Needs Assistance Bed Mobility: Sit to Supine     Supine to sit: Min assist Sit to supine: Mod assist   General bed mobility comments: Mod assist for BLE and then Pt able to peform scooting in bed min guard with verbal cues  Transfers Overall transfer level: Needs assistance Equipment used: Rolling walker (2 wheeled) Transfers: Sit to/from Stand Sit to Stand: Mod assist         General transfer comment: assist to power up from recliner, and comfort height toilet    Balance Overall balance assessment: Needs assistance Sitting-balance support: No upper extremity supported;Feet supported Sitting balance-Leahy Scale: Good     Standing balance support: Bilateral  upper extremity supported;During functional activity Standing balance-Leahy Scale: Poor Standing balance comment: minguard for safety while performing toilet hygiene with on UE support on walker                            ADL Overall ADL's : Needs assistance/impaired Eating/Feeding: Modified independent;Sitting   Grooming: Wash/dry hands;Min guard;Standing Grooming Details (indicate cue type and reason): sink level         Upper Body Dressing : Set up;Sitting   Lower Body Dressing: Moderate assistance;Sit to/from stand;With caregiver independent assisting Lower Body Dressing Details (indicate cue type and reason): Pt's husband agreeable and capable of assisting Toilet Transfer: Minimal assistance;Comfort height toilet;RW;Grab bars;Requires wide/bariatric Toilet Transfer Details (indicate cue type and reason): Pt sat on commode using RW and grab bars as well as min A from therapist for safety Toileting- Clothing Manipulation and Hygiene: Min guard;Sit to/from stand Toileting - Clothing Manipulation Details (indicate cue type and reason): Pt able to perform peri care in standing     Functional mobility during ADLs: Rolling walker;Min guard;Minimal assistance General ADL Comments: Pt requiring increased time for ADL, vc for safety like standing closer to sink to prevent bending during ADL     Vision Vision Assessment?: No apparent visual deficits   Perception     Praxis      Pertinent Vitals/Pain Pain Assessment: 0-10 Pain Score: 9  Pain Location: L knee Pain Descriptors / Indicators: Discomfort;Grimacing;Sore Pain Intervention(s): Monitored during session;Repositioned;Ice applied;Patient requesting pain meds-RN notified     Hand Dominance Right  Extremity/Trunk Assessment Upper Extremity Assessment Upper Extremity Assessment: Overall WFL for tasks assessed   Lower Extremity Assessment Lower Extremity Assessment: LLE deficits/detail;Defer to PT  evaluation LLE Deficits / Details: decrease in ROM and strength post-op LLE: Unable to fully assess due to pain       Communication Communication Communication: No difficulties   Cognition Arousal/Alertness: Awake/alert Behavior During Therapy: WFL for tasks assessed/performed Overall Cognitive Status: Within Functional Limits for tasks assessed                     General Comments       Exercises       Shoulder Instructions      Home Living Family/patient expects to be discharged to:: Private residence Living Arrangements: Spouse/significant other Available Help at Discharge: Family;Available 24 hours/day Type of Home: House Home Access: Stairs to enter Entergy CorporationEntrance Stairs-Number of Steps: 3 Entrance Stairs-Rails: None Home Layout: One level     Bathroom Shower/Tub: Producer, television/film/videoWalk-in shower   Bathroom Toilet: Standard Bathroom Accessibility: Yes How Accessible: Accessible via walker Home Equipment: Walker - 2 wheels;Cane - single point   Additional Comments: states needs BSC       Prior Functioning/Environment Level of Independence: Independent        Comments: had been working until 1 month ago and pain got to be too much, but still doing her own ADL. Pt states         OT Problem List: Decreased strength;Decreased range of motion;Decreased activity tolerance;Impaired balance (sitting and/or standing);Decreased safety awareness;Obesity;Pain   OT Treatment/Interventions:      OT Goals(Current goals can be found in the care plan section) Acute Rehab OT Goals Patient Stated Goal: To return home OT Goal Formulation: With patient Time For Goal Achievement: 06/18/16 Potential to Achieve Goals: Good ADL Goals Pt Will Perform Grooming: with supervision;standing Pt Will Transfer to Toilet: with supervision;ambulating;bedside commode (bariatric BSC over toilet) Pt Will Perform Toileting - Clothing Manipulation and hygiene: with modified independence;sit to/from  stand Pt Will Perform Tub/Shower Transfer: Shower transfer;with supervision;ambulating;rolling walker Additional ADL Goal #1: Pt will verbalize 2 different ways to conserve energy during ADL  OT Frequency: Min 2X/week   Barriers to D/C:            Co-evaluation              End of Session Equipment Utilized During Treatment: Gait belt;Rolling walker Nurse Communication: Patient requests pain meds;Other (comment) (IV had completed infusion and was beeping)  Activity Tolerance: Patient tolerated treatment well Patient left: in bed;with call bell/phone within reach;with family/visitor present   Time: 1336-1410 OT Time Calculation (min): 34 min Charges:  OT General Charges $OT Visit: 1 Procedure OT Evaluation $OT Eval Moderate Complexity: 1 Procedure OT Treatments $Self Care/Home Management : 8-22 mins G-Codes:    Evern BioLaura J Paislynn Hegstrom 06/11/2016, 3:42 PM  Sherryl MangesLaura Quintel Mccalla OTR/L [phone]

## 2016-06-11 NOTE — Anesthesia Postprocedure Evaluation (Signed)
Anesthesia Post Note  Patient: Breanna Ross  Procedure(s) Performed: Procedure(s) (LRB): TOTAL KNEE REVISION (Left)  Patient location during evaluation: PACU Anesthesia Type: General Level of consciousness: awake and alert Pain management: pain level controlled Vital Signs Assessment: post-procedure vital signs reviewed and stable Respiratory status: spontaneous breathing, nonlabored ventilation, respiratory function stable and patient connected to nasal cannula oxygen Cardiovascular status: blood pressure returned to baseline and stable Postop Assessment: no signs of nausea or vomiting Anesthetic complications: no    Last Vitals:  Vitals:   06/11/16 0105 06/11/16 0509  BP: 114/74 (!) 114/51  Pulse: (!) 105 (!) 110  Resp: 16 18  Temp: 37.5 C 36.9 C    Last Pain:  Vitals:   06/11/16 0825  TempSrc:   PainSc: 8                  Breanna Ross Astrid DivineS Errica Dutil

## 2016-06-11 NOTE — Progress Notes (Signed)
RnNencouraged foot foam for pt to elevate her leg in but pt refused multiple times stating that it hurt too much. Pt has been given pain medication and RN will ask again.

## 2016-06-12 LAB — CBC
HCT: 32.8 % — ABNORMAL LOW (ref 36.0–46.0)
Hemoglobin: 10.3 g/dL — ABNORMAL LOW (ref 12.0–15.0)
MCH: 27 pg (ref 26.0–34.0)
MCHC: 31.4 g/dL (ref 30.0–36.0)
MCV: 86.1 fL (ref 78.0–100.0)
Platelets: 233 10*3/uL (ref 150–400)
RBC: 3.81 MIL/uL — ABNORMAL LOW (ref 3.87–5.11)
RDW: 14.4 % (ref 11.5–15.5)
WBC: 9.4 10*3/uL (ref 4.0–10.5)

## 2016-06-12 LAB — GLUCOSE, CAPILLARY: Glucose-Capillary: 151 mg/dL — ABNORMAL HIGH (ref 65–99)

## 2016-06-12 MED ORDER — ASPIRIN EC 325 MG PO TBEC: 325.0000 mg | DELAYED_RELEASE_TABLET | Freq: Two times a day (BID) | ORAL | 0 refills | Status: DC

## 2016-06-12 MED ORDER — HYDROCODONE-ACETAMINOPHEN 10-325 MG PO TABS: 1.0000 | ORAL_TABLET | ORAL | 0 refills | Status: DC | PRN

## 2016-06-12 MED ORDER — METHOCARBAMOL 500 MG PO TABS: 500.0000 mg | ORAL_TABLET | Freq: Two times a day (BID) | ORAL | 0 refills | Status: DC

## 2016-06-12 NOTE — Progress Notes (Signed)
Reviewed discharge paperwork and medications with husband and son with full understanding

## 2016-06-12 NOTE — Progress Notes (Signed)
Occupational Therapy Treatment Patient Details Name: Breanna FurnaceMary B Hentz MRN: [id number] DOB: 01-17-1951 Today's Date: 06/12/2016    History of present illness Patient is a 65 y/o female admitted for L TK revision PMH positive for HTN, DM and GERD.   OT comments  Pt making progress towards goals this session. Pt already dressed when OT entered the room and per Pt report, the nurse tech helped her wash up with min A and then helped her dress. The only assist needed was to get over her feet and then she pulled up the clothes from her knee level. Pt with decreased need for vc for safe hand placement during transfers this session, and no LOB during sink level ADL. No concerns or questions from Pt or husband concerning OT. Pt has been assessed and received all educuation and is at a level safe to d/c to venue below.  Follow Up Recommendations  No OT follow up;Supervision/Assistance - 24 hour    Equipment Recommendations  3 in 1 bedside comode;Other (comment) (bariatric)    Recommendations for Other Services      Precautions / Restrictions Precautions Precautions: Knee;Fall Precaution Comments: reviewed no pillow behind knee Restrictions Weight Bearing Restrictions: Yes LLE Weight Bearing: Weight bearing as tolerated       Mobility Bed Mobility Overal bed mobility: Needs Assistance Bed Mobility: Sit to Supine       Sit to supine: Min guard   General bed mobility comments: increased time, but no physical assist needed for LLE  Transfers Overall transfer level: Needs assistance Equipment used: Rolling walker (2 wheeled) Transfers: Sit to/from Stand Sit to Stand: Min guard         General transfer comment: decreased vc for safe hand placement, reinforced safety with Pt and husband    Balance Overall balance assessment: Needs assistance Sitting-balance support: No upper extremity supported;Feet supported Sitting balance-Leahy Scale: Good Sitting balance - Comments: EOB with  no back support   Standing balance support: No upper extremity supported;During functional activity Standing balance-Leahy Scale: Fair Standing balance comment: sink level ADL                   ADL Overall ADL's : Needs assistance/impaired     Grooming: Wash/dry hands;Min guard;Standing Grooming Details (indicate cue type and reason): sink level                 Toilet Transfer: Minimal assistance;Comfort height toilet;RW;Grab bars;Requires wide/bariatric Toilet Transfer Details (indicate cue type and reason): Pt improved strength and reports less pain with toilet transfer today, Pt used grab bar for sit <> stand but stand by assist only Toileting- Clothing Manipulation and Hygiene: Sit to/from stand;Minimal assistance Toileting - Clothing Manipulation Details (indicate cue type and reason): Pt able to perform peri care in standing, and able to manage underwear and pants with min assist Tub/ Shower Transfer: Walk-in shower;Min guard;Cueing for sequencing;Ambulation;Rolling walker;3 in 1 Tub/Shower Transfer Details (indicate cue type and reason): educated Pt in sequence for safe shower transfer (non-operated leg in first) Functional mobility during ADLs: Rolling walker;Min guard General ADL Comments: Pt making improvements and will have adequate support upon d/c      Vision                     Perception     Praxis      Cognition   Behavior During Therapy: [facility] tasks assessed/performed Overall Cognitive Status: Within Functional Limits for tasks assessed  Extremity/Trunk Assessment               Exercises    Shoulder Instructions       General Comments      Pertinent Vitals/ Pain       Pain Assessment: 0-10 Pain Score: 6  Faces Pain Scale: Hurts little more Pain Location: L knee Pain Descriptors / Indicators: Aching Pain Intervention(s): Limited activity within patient's tolerance;Repositioned;Monitored  during session (Pt wanting to get pain meds before d/c)  Home Living                                          Prior Functioning/Environment              Frequency  Min 2X/week        Progress Toward Goals  OT Goals(current goals can now be found in the care plan section)  Progress towards OT goals: Progressing toward goals  Acute Rehab OT Goals Patient Stated Goal: to get home ASAP OT Goal Formulation: With patient Time For Goal Achievement: 06/18/16 Potential to Achieve Goals: Good  Plan Discharge plan remains appropriate    Co-evaluation                 End of Session Equipment Utilized During Treatment: Gait belt;Rolling walker   Activity Tolerance Patient tolerated treatment well   Patient Left in bed;with call bell/phone within reach;with family/visitor present   Nurse Communication Mobility status;Patient requests pain meds (pain meds prior to d/c)        Time: 4098-11911024-1043 OT Time Calculation (min): 19 min  Charges: OT General Charges $OT Visit: 1 Procedure OT Treatments $Self Care/Home Management : 8-22 mins  Evern BioLaura J Deionna Marcantonio 06/12/2016, 10:55 AM Sherryl MangesLaura Lillyanna Glandon OTR/L [phone]

## 2016-06-12 NOTE — Progress Notes (Signed)
Physical Therapy Treatment Patient Details Name: Breanna Ross MRN: [id number] DOB: [birth year] Today's Date: 06/12/2016    History of Present Illness Patient is a 65 y/o female admitted for L TK revision PMH positive for HTN, DM and GERD.    PT Comments    Patient is progressing well toward mobility goals and tolerated increased gait distance and stair training this session. Pt given HEP handout and reviewed. Current plan remains appropriate.   Follow Up Recommendations  Home health PT;Supervision/Assistance - 24 hour     Equipment Recommendations  3in1 (PT)    Recommendations for Other Services       Precautions / Restrictions Precautions Precautions: Knee;Fall Precaution Comments: reviewed no pillow behind knee Restrictions Weight Bearing Restrictions: Yes LLE Weight Bearing: Weight bearing as tolerated    Mobility  Bed Mobility Overal bed mobility: Modified Independent Bed Mobility: Supine to Sit           General bed mobility comments: increased time and effort  Transfers Overall transfer level: Needs assistance Equipment used: Rolling walker (2 wheeled) Transfers: Sit to/from Stand Sit to Stand: Min guard         General transfer comment: cues for safe hand placement and technique with carry over demonstrated throughout session; from EOB, commode with grab bars, and recliner  Ambulation/Gait Ambulation/Gait assistance: Supervision Ambulation Distance (Feet): 135 Feet Assistive device: Rolling walker (2 wheeled) Gait Pattern/deviations: Step-through pattern;Decreased stance time - left;Decreased step length - right;Decreased weight shift to left     General Gait Details: slow, steady gait; pt with improved step through gait with increased distance; cues for sequencing and L heel strike; pt with increased WB on LLE   Stairs Stairs: Yes Stairs assistance: Min assist Stair Management: No rails;Step to pattern;Backwards;With walker Number of  Stairs: 3 General stair comments: cues for sequencing and technique; assist to steady RW; no instability noted  Wheelchair Mobility    Modified Rankin (Stroke Patients Only)       Balance     Sitting balance-Leahy Scale: Good       Standing balance-Leahy Scale: Fair Standing balance comment: able to static stand without UE support at sink                    Cognition Arousal/Alertness: Awake/alert Behavior During Therapy: WFL for tasks assessed/performed Overall Cognitive Status: Within Functional Limits for tasks assessed                      Exercises Total Joint Exercises Ankle Circles/Pumps: AROM;Both;15 reps Quad Sets: AROM;Left;10 reps Heel Slides: AAROM;Left;10 reps (limited by adipose tissue) Goniometric ROM:  (~ 75 degrees in sitting)    General Comments General comments (skin integrity, edema, etc.): SpO2 remained between 92-98% throughout session and HR 118 and >       Pertinent Vitals/Pain Pain Assessment: Faces Faces Pain Scale: Hurts little more Pain Location: L knee Pain Descriptors / Indicators: Sore Pain Intervention(s): Limited activity within patient's tolerance;Monitored during session;Premedicated before session;Repositioned    Home Living                      Prior Function            PT Goals (current goals can now be found in the care plan section) Acute Rehab PT Goals Patient Stated Goal: To return home Progress towards PT goals: Progressing toward goals    Frequency    7X/week  PT Plan Current plan remains appropriate    Co-evaluation             End of Session Equipment Utilized During Treatment: Gait belt Activity Tolerance: Patient tolerated treatment well Patient left: in chair;with call bell/phone within reach;with family/visitor present     Time: [id number] PT Time Calculation (min) (ACUTE ONLY): 52 min  Charges:  $Gait Training: 8-22 mins $Therapeutic Exercise: 8-22  mins $Therapeutic Activity: 8-22 mins                    G Codes:      Derek MoundKellyn R Breanna Ross Sharps, PTA Pager: [phone]   06/12/2016, 9:46 AM

## 2016-06-12 NOTE — Discharge Summary (Signed)
Patient ID: Breanna Ross MRN: [id number] DOB/AGE: April 15, 1951 65 y.o.  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 Arthritis    BIlateral Knees (06/10/2016)  . Asthma   . GERD (gastroesophageal reflux disease)   . Hypercholesteremia   . Hypertension   . Primary localized osteoarthritis of right knee   . Shortness of breath    with exertion  . Sleep apnea    unable to use cpap (06/10/2016)  . Type 2 diabetes mellitus (HCC)     Surgeries: Procedure(s): TOTAL KNEE REVISION on 06/10/2016   Consultants:   Discharged Condition: Improved  Hospital Course: Breanna Ross is an 65 y.o. female who was admitted 06/10/2016 for operative treatment of<principal problem not specified>. Patient has severe unremitting pain that affects sleep, daily activities, and work/hobbies. After pre-op clearance the patient was taken to the operating room on 06/10/2016 and underwent  Procedure(s): TOTAL KNEE REVISION.    Patient was given perioperative antibiotics: Anti-infectives    Start     Dose/Rate Route Frequency Ordered Stop   06/10/16 1424  cefUROXime (ZINACEF) injection  Status:  Discontinued       As needed 06/10/16 1425 06/10/16 1518   06/10/16 1000  vancomycin (VANCOCIN) 1,500 mg in sodium chloride 0.9 % 500 mL IVPB     1,500 mg 250 mL/hr over 120 Minutes Intravenous To ShortStay Surgical 06/07/16 1149 06/10/16 1233       Patient was given sequential compression devices, early ambulation, and chemoprophylaxis to prevent DVT.  Patient benefited maximally from hospital stay and there were no complications.    Recent vital signs: Patient Vitals for the past 24 hrs:  BP Temp Temp src Pulse Resp SpO2  06/12/16 0437 131/72 98.3 F (36.8 C) Oral 99 16 97 %  06/11/16 2037 - - - - - 97 %  06/11/16  2004 111/63 98.6 F (37 C) Oral 100 16 100 %  06/11/16 1501 114/66 98.5 F (36.9 C) - (!) 103 16 95 %     Recent laboratory studies:  Recent Labs  06/11/16 0433 06/12/16 0652  WBC 12.3* 9.4  HGB 11.3* 10.3*  HCT 35.1* 32.8*  PLT 291 233  NA 136  --   K 3.9  --   CL 100*  --   CO2 28  --   BUN 11  --   CREATININE 0.87  --   GLUCOSE 172*  --   CALCIUM 8.7*  --      Discharge Medications:     Medication List    TAKE these medications   acetaminophen 500 MG tablet Commonly known as:  TYLENOL Take 1,000 mg by mouth 2 (two) times daily.   albuterol 108 (90 Base) MCG/ACT inhaler Commonly known as:  PROVENTIL HFA;VENTOLIN HFA Inhale 2 puffs into the lungs every 6 (six) hours as needed for wheezing or shortness of breath.   ALIGN 4 MG Caps Take 4 mg by mouth daily.   aspirin EC 325 MG tablet Take 1 tablet (325 mg total) by mouth 2 (two) times daily.   azithromycin 250 MG tablet Commonly known as:  ZITHROMAX Take two tablet the first day then one tablet daily until finished.   CALCIUM + D3 PO Take 1 tablet by mouth 2 (two) times daily.   celecoxib 200 MG capsule Commonly known as:  CELEBREX Take 1 capsule (200 mg total) by mouth daily.   cetirizine 10 MG tablet Commonly known as:  ZYRTEC Take 10 mg by mouth daily as needed for allergies.   chlorpheniramine-HYDROcodone 10-8 MG/5ML Lqcr Commonly known as:  TUSSIONEX PENNKINETIC ER Take 5 mLs by mouth at bedtime as needed.   CLOBETASOL PROPIONATE E 0.05 % emollient cream Generic drug:  Clobetasol Prop Emollient Base Apply 1 application topically 2 (two) times daily.   CO Q 10 PO Take 1 tablet by mouth daily.   colesevelam 625 MG tablet Commonly known as:  WELCHOL Take 1,875 mg by mouth 3 (three) times daily.   cyanocobalamin 2000 MCG tablet Take 2,000 mcg by mouth daily.   diclofenac sodium 1 % Gel Commonly known as:  VOLTAREN Apply 2 g topically 3 (three) times daily as needed (pain).    esomeprazole 40 MG capsule Commonly known as:  NEXIUM Take 40 mg by mouth daily before breakfast.   fluticasone 50 MCG/ACT nasal spray Commonly known as:  FLONASE Place 2 sprays into both nostrils daily. What changed:  how much to take  when to take this  reasons to take this   HYDROcodone-acetaminophen 10-325 MG tablet Commonly known as:  NORCO Take 1-2 tablets by mouth every 4 (four) hours as needed (breakthrough pain).   Magnesium 400 MG Caps Take 400 mg by mouth every evening.   metFORMIN 500 MG tablet Commonly known as:  GLUCOPHAGE Take 500 mg by mouth 2 (two) times daily with a meal.   methocarbamol 500 MG tablet Commonly known as:  ROBAXIN Take 1 tablet (500 mg total) by mouth 2 (two) times daily with a meal.   mometasone-formoterol 100-5 MCG/ACT Aero Commonly known as:  DULERA Inhale 2 puffs into the lungs 2 (two) times daily.   morphine 15 MG 12 hr tablet Commonly known as:  MS CONTIN Take 1 tablet (15 mg total) by mouth every 12 (twelve) hours.   multivitamin with minerals tablet Take 1 tablet by mouth daily. Alive   multivitamin with minerals Tabs tablet Take 1 tablet by mouth daily.   Omega 3 1000 MG Caps Take 1,000 mg by mouth daily.   polyethylene glycol packet Commonly known as:  MIRALAX / GLYCOLAX Take 17 g by mouth 2 (two) times daily.   SYSTANE OP Place 1 drop into both eyes daily as needed (for irritation).   traMADol 50 MG tablet Commonly known as:  ULTRAM Take 50 mg by mouth every 6 (six) hours as needed for moderate pain.   valsartan-hydrochlorothiazide 320-25 MG tablet Commonly known as:  DIOVAN-HCT Take 1 tablet by mouth daily.   vitamin C 1000 MG tablet Take 1,000 mg by mouth daily.   Vitamin D3 5000 units Caps Take 5,000 Units by mouth daily.   vitamin E 400 UNIT capsule Take 400 Units by mouth daily.            Durable Medical Equipment        Start     Ordered   06/10/16 1756  DME Walker rolling  Once     Question:  Patient needs a walker to treat with the following condition  Answer:  Status post revision of total knee   06/10/16 1756   06/10/16 1756  DME 3 n 1  Once     06/10/16 1756      Diagnostic Studies: Dg Chest 2 View  Result Date: 05/31/2016 CLINICAL DATA:  Preop for knee surgery, shortness of breath EXAM: CHEST  2 VIEW COMPARISON:  Chest x-ray of 06/11/2013 FINDINGS: No active infiltrate or effusion is seen. Mediastinal and hilar contours are unremarkable. The heart is mildly enlarged and stable. No acute bony abnormality is seen. IMPRESSION: No active cardiopulmonary disease. Electronically Signed   By: Dwyane DeePaul  Barry M.D.   On: 05/31/2016 15:40   Dg Knee 1-2 Views Left  Result Date: 06/10/2016 CLINICAL DATA:  Revision of LEFT knee replacement EXAM: LEFT KNEE - 1-2 VIEW COMPARISON:  Portable exam 1728 hours compared to 06/10/2016 FINDINGS: LEFT knee prosthesis identified new since previous study. No periprosthetic lucency. Bones diffusely demineralized. No fracture or dislocation seen. Scattered soft tissue swelling and soft tissue gas anteriorly consistent with preceding surgery. IMPRESSION: LEFT knee prosthesis without acute complication. Electronically Signed   By: Ulyses SouthwardMark  Boles M.D.   On: 06/10/2016 17:47   Dg Tibia/fibula Left Port  Result Date: 06/10/2016 CLINICAL DATA:  Missing pen. EXAM: PORTABLE LEFT TIBIA AND FIBULA - 2 VIEW COMPARISON:  MRI of August 18, 2009. FINDINGS: There is a large defect involving the distal femur consistent with removal of femoral prosthesis. Defect is also seen involving proximal tibia consistent with removal of prosthesis. Soft tissue irregularity in gas is noted consistent with postoperative state. Surgical sponge appears to be present overlying knee. No other foreign body is noted. IMPRESSION: Large defect involving distal femur and proximal tibia consistent with removal of arthroplasty prosthesis. Postoperative changes are noted. Surgical spine is  seen overlying knee. No other foreign body seen. These results were called by telephone at the time of interpretation on 06/10/2016 at 2:07 pm to Brooke BonitoSierra Simmons, nurse, who verbally acknowledged these results. Electronically Signed   By: Lupita RaiderJames  Green Jr, M.D.   On: 06/10/2016 14:08    Disposition: 06-Home-Health Care Svc  Discharge Instructions    Call MD / Call 911    Complete by:  As directed    If you experience chest pain or shortness of breath, CALL 911 and be transported to the hospital emergency room.  If you develope a fever above 101 F, pus (white drainage) or increased drainage or redness at the wound, or calf pain, call your surgeon's office.   Constipation Prevention    Complete by:  As directed    Drink plenty of fluids.  Prune juice may be helpful.  You may use a stool softener, such as Colace (over the counter) 100 mg twice a day.  Use MiraLax (over the counter) for constipation as needed.   Diet - low sodium heart healthy    Complete by:  As directed    Driving restrictions    Complete by:  As directed    No driving for 2 weeks   Increase activity slowly as tolerated    Complete by:  As directed    Patient may shower    Complete by:  As directed    You may shower without a dressing once there is no drainage.  Do not wash over the wound.  If drainage remains, cover wound with plastic wrap and then shower.      Follow-up Information    Nestor LewandowskyOWAN,FRANK J, MD Follow up in 2 week(s).   Specialty:  Orthopedic Surgery Contact information: [address] [place] [address] [phone]        [facility] Follow up.   Why:  Someone from [facility] will contact you to arrange start date and time for therapy. Contact information: [address] [place] [address] [phone]  Signed: Caelin Rosen R 06/12/2016, 7:40 AM

## 2016-06-12 NOTE — Progress Notes (Signed)
PATIENT ID: Breanna Ross  MRN: [id number]  DOB/AGE:  20-Feb-1951 / 65 y.o.  2 Days Post-Op Procedure(s) (LRB): TOTAL KNEE REVISION (Left)    PROGRESS NOTE Subjective: Patient is alert, oriented, no Nausea, no Vomiting, yes passing gas. Taking PO with small bites. Denies SOB, Chest or Calf Pain. Using Incentive Spirometer, PAS in place. Ambulate WBAT with pt walking 50 ft with therapy, Patient reports pain as 7/10 .    Objective: Vital signs in last 24 hours: Vitals:   06/11/16 1501 06/11/16 2004 06/11/16 2037 06/12/16 0437  BP: 114/66 111/63  131/72  Pulse: (!) 103 100  99  Resp: 16 16  16   Temp: 98.5 F (36.9 C) 98.6 F (37 C)  98.3 F (36.8 C)  TempSrc:  Oral  Oral  SpO2: 95% 100% 97% 97%  Weight:          Intake/Output from previous day: I/O last 3 completed shifts: In: 2468.8 [I.V.:2468.8] Out: 1650 [Urine:1650]   Intake/Output this shift: No intake/output data recorded.   LABORATORY DATA:  Recent Labs  06/11/16 0433  06/11/16 1632 06/11/16 2140 06/12/16 0631 06/12/16 0652  WBC 12.3*  --   --   --   --  9.4  HGB 11.3*  --   --   --   --  10.3*  HCT 35.1*  --   --   --   --  32.8*  PLT 291  --   --   --   --  233  NA 136  --   --   --   --   --   K 3.9  --   --   --   --   --   CL 100*  --   --   --   --   --   CO2 28  --   --   --   --   --   BUN 11  --   --   --   --   --   CREATININE 0.87  --   --   --   --   --   GLUCOSE 172*  --   --   --   --   --   GLUCAP  --   < > 140* 162* 151*  --   CALCIUM 8.7*  --   --   --   --   --   < > = values in this interval not displayed.  Examination: Neurologically intact Neurovascular intact Sensation intact distally Intact pulses distally Dorsiflexion/Plantar flexion intact Incision: dressing C/D/I No cellulitis present Compartment soft}  Assessment:   2 Days Post-Op Procedure(s) (LRB): TOTAL KNEE REVISION (Left) ADDITIONAL DIAGNOSIS: Expected Acute Blood Loss Anemia, Diabetes, Hypertension and chronic  pain  Plan: PT/OT WBAT, no CPM  DVT Prophylaxis:  SCDx72hrs, ASA 325 mg BID x 2 weeks DISCHARGE PLAN: Home DISCHARGE NEEDS: HHPT, Walker and 3-in-1 comode seat     Breanna Ross R 06/12/2016, 7:35 AM

## 2016-07-24 ENCOUNTER — Ambulatory Visit
Admission: RE | Admit: 2016-07-24 | Discharge: 2016-07-24 | Disposition: A | Discharge status: Home / Self Care | Payer: 59 | Source: Ambulatory Visit | Attending: Obstetrics and Gynecology | Admitting: Obstetrics and Gynecology

## 2016-07-24 DIAGNOSIS — Z1231 Encounter for screening mammogram for malignant neoplasm of breast: Secondary | ICD-10-CM

## 2017-08-14 ENCOUNTER — Other Ambulatory Visit: Payer: Self-pay | Admitting: Obstetrics and Gynecology

## 2017-08-14 DIAGNOSIS — Z139 Encounter for screening, unspecified: Secondary | ICD-10-CM

## 2017-09-03 ENCOUNTER — Ambulatory Visit
Admission: RE | Admit: 2017-09-03 | Discharge: 2017-09-03 | Disposition: A | Discharge status: Home / Self Care | Payer: 59 | Source: Ambulatory Visit | Attending: Obstetrics and Gynecology | Admitting: Obstetrics and Gynecology

## 2017-09-03 DIAGNOSIS — Z139 Encounter for screening, unspecified: Secondary | ICD-10-CM

## 2017-10-08 DIAGNOSIS — G5603 Carpal tunnel syndrome, bilateral upper limbs: Secondary | ICD-10-CM | POA: Insufficient documentation

## 2017-10-08 DIAGNOSIS — M79642 Pain in left hand: Secondary | ICD-10-CM | POA: Insufficient documentation

## 2017-10-08 DIAGNOSIS — M79641 Pain in right hand: Secondary | ICD-10-CM | POA: Insufficient documentation

## 2017-11-04 DIAGNOSIS — Z4789 Encounter for other orthopedic aftercare: Secondary | ICD-10-CM | POA: Insufficient documentation

## 2018-06-17 DIAGNOSIS — Z5189 Encounter for other specified aftercare: Secondary | ICD-10-CM | POA: Insufficient documentation

## 2018-07-23 DIAGNOSIS — G629 Polyneuropathy, unspecified: Secondary | ICD-10-CM | POA: Insufficient documentation

## 2018-08-04 ENCOUNTER — Ambulatory Visit
Admission: RE | Admit: 2018-08-04 | Discharge: 2018-08-04 | Disposition: A | Discharge status: Home / Self Care | Payer: 59 | Source: Ambulatory Visit | Attending: Internal Medicine | Admitting: Internal Medicine

## 2018-08-04 ENCOUNTER — Other Ambulatory Visit: Payer: Self-pay | Admitting: Internal Medicine

## 2018-08-04 DIAGNOSIS — R059 Cough, unspecified: Secondary | ICD-10-CM

## 2018-08-04 DIAGNOSIS — R05 Cough: Secondary | ICD-10-CM

## 2018-08-27 ENCOUNTER — Other Ambulatory Visit: Payer: Self-pay | Admitting: Obstetrics and Gynecology

## 2018-08-27 DIAGNOSIS — Z1231 Encounter for screening mammogram for malignant neoplasm of breast: Secondary | ICD-10-CM

## 2018-09-29 ENCOUNTER — Ambulatory Visit: Payer: 59

## 2018-10-30 ENCOUNTER — Ambulatory Visit: Payer: 59

## 2018-12-15 ENCOUNTER — Ambulatory Visit: Payer: 59

## 2018-12-26 ENCOUNTER — Ambulatory Visit: Payer: 59

## 2019-07-07 ENCOUNTER — Ambulatory Visit: Payer: Self-pay

## 2019-07-28 ENCOUNTER — Ambulatory Visit: Payer: Medicare Other | Attending: Internal Medicine

## 2019-07-28 DIAGNOSIS — Z20822 Contact with and (suspected) exposure to covid-19: Secondary | ICD-10-CM

## 2019-07-30 LAB — NOVEL CORONAVIRUS, NAA: SARS-CoV-2, NAA: NOT DETECTED

## 2019-08-20 ENCOUNTER — Ambulatory Visit: Payer: Self-pay

## 2019-09-17 ENCOUNTER — Other Ambulatory Visit: Payer: Self-pay

## 2019-09-17 ENCOUNTER — Ambulatory Visit: Admission: RE | Admit: 2019-09-17 | Payer: Medicare Other | Source: Ambulatory Visit

## 2019-10-26 ENCOUNTER — Ambulatory Visit
Admission: RE | Admit: 2019-10-26 | Discharge: 2019-10-26 | Disposition: A | Discharge status: Home / Self Care | Payer: Medicare Other | Source: Ambulatory Visit | Attending: Obstetrics and Gynecology | Admitting: Obstetrics and Gynecology

## 2019-10-26 ENCOUNTER — Other Ambulatory Visit: Payer: Self-pay

## 2019-10-26 DIAGNOSIS — Z1231 Encounter for screening mammogram for malignant neoplasm of breast: Secondary | ICD-10-CM

## 2019-10-27 ENCOUNTER — Other Ambulatory Visit: Payer: Self-pay

## 2019-10-27 ENCOUNTER — Ambulatory Visit
Admission: RE | Admit: 2019-10-27 | Discharge: 2019-10-27 | Disposition: A | Discharge status: Home / Self Care | Payer: Medicare Other | Source: Ambulatory Visit | Attending: Internal Medicine | Admitting: Internal Medicine

## 2019-10-27 ENCOUNTER — Other Ambulatory Visit: Payer: Self-pay | Admitting: Internal Medicine

## 2019-10-27 DIAGNOSIS — R0600 Dyspnea, unspecified: Secondary | ICD-10-CM

## 2019-12-27 ENCOUNTER — Encounter: Payer: Self-pay | Admitting: Cardiology

## 2019-12-27 ENCOUNTER — Other Ambulatory Visit: Payer: Self-pay

## 2019-12-27 ENCOUNTER — Ambulatory Visit: Payer: Medicare Other | Admitting: Cardiology

## 2019-12-27 VITALS — BP 170/112 | HR 90 | Resp 15 | Ht 67.0 in | Wt 294.0 lb

## 2019-12-27 DIAGNOSIS — Z6841 Body Mass Index (BMI) 40.0 and over, adult: Secondary | ICD-10-CM

## 2019-12-27 DIAGNOSIS — E119 Type 2 diabetes mellitus without complications: Secondary | ICD-10-CM

## 2019-12-27 DIAGNOSIS — R0600 Dyspnea, unspecified: Secondary | ICD-10-CM

## 2019-12-27 DIAGNOSIS — Z87891 Personal history of nicotine dependence: Secondary | ICD-10-CM

## 2019-12-27 DIAGNOSIS — I10 Essential (primary) hypertension: Secondary | ICD-10-CM

## 2019-12-27 DIAGNOSIS — R0602 Shortness of breath: Secondary | ICD-10-CM

## 2019-12-27 DIAGNOSIS — R0609 Other forms of dyspnea: Secondary | ICD-10-CM

## 2019-12-27 MED ORDER — SPIRONOLACTONE 25 MG PO TABS: 12.5000 mg | ORAL_TABLET | Freq: Every morning | ORAL | 0 refills | Status: DC

## 2019-12-27 NOTE — Progress Notes (Signed)
Date:  12/27/2019   ID:  Breanna Ross, DOB [birth year], MRN [id number]  PCP:  Willey Blade, MD  Cardiologist:  Rex Kras, DO, [facility] (established care 12/27/2019) Former Cardiology Providers: Dr. Adrian Prows  REASON FOR CONSULT: Other forms of dyspnea   REQUESTING PHYSICIAN:  Willey Blade, MD [address] [place],  [place] [address]  Chief Complaint  Patient presents with  . Shortness of Breath    HPI  Breanna Ross is a 69 y.o. female who is being seen today for the evaluation of shortness of breath at the request of Willey Blade, MD. Patient's past medical history and cardiac risk factors include: Hyperlipidemia, hypertension, non-insulin-dependent diabetes mellitus type 2, sleep apnea not on CPAP, obesity due to excess calories, history of carpal tunnel syndrome, postmenopausal female, advanced age.  Patient states that she was vaccinated against COVID-19 infection back in February 2021.  However, since April 2021 she has been experiencing shortness of breath that is at rest and also with effort related activities.  Patient states that her symptoms of shortness of breath remained stable not progressive.  They are usually more prominent during the morning hours in the evening hours and during the daytime she is doing well.  She does not have any effort related angina.  No prior episodes of shortness of breath.  Denies prior history of coronary artery disease, myocardial infarction, congestive heart failure, deep venous thrombosis, pulmonary embolism, stroke, transient ischemic attack.  FUNCTIONAL STATUS:  No structured exercise program or daily routine.    ALLERGIES: Allergies  Allergen Reactions  . Dilaudid [Hydromorphone Hcl] Other (See Comments)    Hallucinations   . Penicillins Swelling    SWELLING REACTION UNSPECIFIED   Has patient had a PCN reaction causing immediate rash, facial/tongue/throat swelling, SOB or lightheadedness with hypotension:    # # YES # #  Has patient had a PCN reaction causing severe rash involving mucus membranes or skin necrosis: No Has patient had a PCN reaction that required hospitalization No Has patient had a PCN reaction occurring within the last 10 years:No If all of the above answers are "NO", then may proceed with Cephalosporin use.   Marland Kitchen Oxycodone Nausea Only    MEDICATION LIST PRIOR TO VISIT: Current Meds  Medication Sig  . acetaminophen (TYLENOL) 500 MG tablet Take 1,000 mg by mouth 2 (two) times daily.  Marland Kitchen albuterol (PROVENTIL HFA;VENTOLIN HFA) 108 (90 BASE) MCG/ACT inhaler Inhale 2 puffs into the lungs every 6 (six) hours as needed for wheezing or shortness of breath.   Marland Kitchen amLODipine (NORVASC) 10 MG tablet Take 10 mg by mouth daily.   . Ascorbic Acid (VITAMIN C) 1000 MG tablet Take 1,000 mg by mouth daily.  Marland Kitchen aspirin EC 81 MG tablet Take 81 mg by mouth daily.  . Calcium Carb-Cholecalciferol (CALCIUM + D3 PO) Take 1 tablet by mouth 2 (two) times daily.  . celecoxib (CELEBREX) 200 MG capsule Take 1 capsule (200 mg total) by mouth daily.  . cetirizine (ZYRTEC) 10 MG tablet Take 10 mg by mouth daily as needed for allergies.   . Cholecalciferol (VITAMIN D3) 5000 units CAPS Take 5,000 Units by mouth daily.  . Clobetasol Prop Emollient Base (CLOBETASOL PROPIONATE E) 0.05 % emollient cream Apply 1 application topically 2 (two) times daily.  . Coenzyme Q10 (CO Q 10 PO) Take 1 tablet by mouth daily.  . colesevelam (WELCHOL) 625 MG tablet Take 1,875 mg by mouth 3 (three) times daily.  . cyanocobalamin 2000  MCG tablet Take 2,000 mcg by mouth daily.  . diclofenac sodium (VOLTAREN) 1 % GEL Apply 2 g topically 3 (three) times daily as needed (pain).  Marland Kitchen esomeprazole (NEXIUM) 40 MG capsule Take 40 mg by mouth daily before breakfast.  . fluticasone (FLONASE) 50 MCG/ACT nasal spray Place 2 sprays into both nostrils daily. (Patient taking differently: Place 1 spray into both nostrils 2 (two) times daily as needed for  rhinitis. )  . Magnesium 400 MG CAPS Take 400 mg by mouth every evening.  . metFORMIN (GLUCOPHAGE) 500 MG tablet Take 500 mg by mouth 2 (two) times daily with a meal.   . mometasone-formoterol (DULERA) 100-5 MCG/ACT AERO Inhale 2 puffs into the lungs 2 (two) times daily.  . Multiple Vitamins-Minerals (MULTIVITAMIN WITH MINERALS) tablet Take 1 tablet by mouth daily. Alive  . Omega 3 1000 MG CAPS Take 1,000 mg by mouth daily.  Marland Kitchen omega-3 acid ethyl esters (LOVAZA) 1 g capsule Take by mouth 2 (two) times daily.  Vladimir Faster Glycol-Propyl Glycol (SYSTANE OP) Place 1 drop into both eyes daily as needed (for irritation).  . Probiotic Product (ALIGN) 4 MG CAPS Take 4 mg by mouth daily.  . valsartan-hydrochlorothiazide (DIOVAN-HCT) 320-25 MG per tablet Take 1 tablet by mouth daily.  . vitamin E 400 UNIT capsule Take 400 Units by mouth daily.     PAST MEDICAL HISTORY: Past Medical History:  Diagnosis Date  . Arthritis    BIlateral Knees (06/10/2016)  . Asthma   . GERD (gastroesophageal reflux disease)   . Hypercholesteremia   . Hypertension   . Primary localized osteoarthritis of right knee   . Shortness of breath    with exertion  . Sleep apnea    unable to use cpap (06/10/2016)  . Type 2 diabetes mellitus ([place])     PAST SURGICAL HISTORY: Past Surgical History:  Procedure Laterality Date  . COLONOSCOPY    . HEMORRHOID SURGERY    . JOINT REPLACEMENT    . KNEE ARTHROSCOPY W/ MENISCECTOMY Left 09-29-09  . REDUCTION MAMMAPLASTY Bilateral 1998  . SHOULDER ARTHROSCOPY W/ ROTATOR CUFF REPAIR Bilateral 2007  . TOTAL KNEE ARTHROPLASTY Left 06/21/2013   Dr Noemi Chapel  . TOTAL KNEE ARTHROPLASTY Left 06/21/2013   Procedure: TOTAL KNEE ARTHROPLASTY- left;  Surgeon: Lorn Junes, MD;  Location: [place];  Service: Orthopedics;  Laterality: Left;  . TOTAL KNEE ARTHROPLASTY Right 04/11/2014   Procedure: TOTAL KNEE ARTHROPLASTY;  Surgeon: Lorn Junes, MD;  Location: [place];  Service: Orthopedics;   Laterality: Right;  . TOTAL KNEE REVISION Left 06/10/2016   Procedure: TOTAL KNEE REVISION;  Surgeon: Frederik Pear, MD;  Location: [place];  Service: Orthopedics;  Laterality: Left;  . TUBAL LIGATION      FAMILY HISTORY: The patient family history includes Diabetes in her brother, brother, brother, and sister; Heart attack in her father; Hypertension in her father, mother, sister, sister, sister, sister, and sister; Stroke in her brother and mother.  SOCIAL HISTORY:  The patient  reports that she quit smoking about 13 years ago. Her smoking use included cigarettes. She has a 37.00 pack-year smoking history. She has never used smokeless tobacco. She reports current alcohol use. She reports that she does not use drugs.  REVIEW OF SYSTEMS: Review of Systems  Constitution: Negative for chills and fever.  HENT: Negative for hoarse voice and nosebleeds.   Eyes: Negative for discharge, double vision and pain.  Cardiovascular: Negative for chest pain, claudication, dyspnea on exertion, leg swelling,  near-syncope, orthopnea, palpitations, paroxysmal nocturnal dyspnea and syncope.  Respiratory: Positive for shortness of breath. Negative for hemoptysis.   Musculoskeletal: Positive for back pain and joint pain. Negative for muscle cramps and myalgias.  Gastrointestinal: Negative for abdominal pain, constipation, diarrhea, hematemesis, hematochezia, melena, nausea and vomiting.  Neurological: Negative for dizziness and light-headedness.    PHYSICAL EXAM: Vitals with BMI 12/27/2019 06/12/2016 06/11/2016  Height '5\' 7"'  - -  Weight 294 lbs - -  BMI 07.86 - -  Systolic 754 492 010  Diastolic 071 72 63  Pulse 90 99 100   CONSTITUTIONAL: Well-developed and well-nourished. No acute distress.  SKIN: Skin is warm and dry. No rash noted. No cyanosis. No pallor. No jaundice HEAD: Normocephalic and atraumatic.  EYES: No scleral icterus MOUTH/THROAT: Moist oral membranes.  NECK: No JVD present. No thyromegaly  noted. No carotid bruits  LYMPHATIC: No visible cervical adenopathy.  CHEST Normal respiratory effort. No intercostal retractions  LUNGS: Clear to auscultation bilaterally. No stridor. No wheezes. No rales.  CARDIOVASCULAR: Regular rate and rhythm, positive S1-S2, no murmurs rubs or gallops appreciated. ABDOMINAL: Obese, soft, nontender, nondistended, positive bowel sounds all 4 quadrants. No apparent ascites.  EXTREMITIES: Bilateral 1+ pitting peripheral edema.  HEMATOLOGIC: No significant bruising NEUROLOGIC: Oriented to person, place, and time. Nonfocal. Normal muscle tone.  PSYCHIATRIC: Normal mood and affect. Normal behavior. Cooperative  CARDIAC DATABASE: EKG: 12/27/2019: Normal sinus rhythm, 94 bpm, PACs, poor R wave progression, without underlying ischemia or injury pattern.  Echocardiogram: None recently.   Stress Testing: None recently.   Heart Catheterization:  None  LABORATORY DATA:  External Labs: Collected: 10/27/2019 Creatinine 0.86 mg/dL. eGFR: 81 mL/min per 1.73 m Lipid profile: Total cholesterol 190, triglycerides 100, HDL 51, LDL 121 Hemoglobin A1c: 6.6  IMPRESSION:    ICD-10-CM   1. SOB (shortness of breath)  R06.02 EKG 21-FXJO    Basic Metabolic Panel (BMET)    Magnesium    B Nat Peptide    spironolactone (ALDACTONE) 25 MG tablet    Basic metabolic panel    Magnesium    Magnesium    Basic metabolic panel  2. DOE (dyspnea on exertion)  R06.00 EKG 12-Lead    PCV ECHOCARDIOGRAM COMPLETE    PCV MYOCARDIAL PERFUSION WITH LEXISCAN  3. Non-insulin dependent type 2 diabetes mellitus ([place])  E11.9   4. Benign hypertension  I10 spironolactone (ALDACTONE) 25 MG tablet  5. Former smoker  Z87.891   14. Class 3 severe obesity due to excess calories with serious comorbidity and body mass index (BMI) of 45.0 to 49.9 in adult [facility])  E66.01    Z68.42      RECOMMENDATIONS: Breanna Ross is a 70 y.o. female whose past medical history and cardiac risk factors  include: Hyperlipidemia, hypertension, non-insulin-dependent diabetes mellitus type 2, sleep apnea not on CPAP, obesity due to excess calories, history of carpal tunnel syndrome, postmenopausal female, advanced age.  Shortness of breath and effort related dyspnea:  Check BMP, magnesium, and BNP.   EKG shows normal sinus rhythm with rare PACs.  Echocardiogram will be ordered to evaluate for structural heart disease and left ventricular systolic function.  Nuclear stress test recommended to evaluate for reversible ischemia.  Chest x-ray at [facility] imaging performed on October 27, 2019 reported no active cardiopulmonary disease.  Benign essential hypertension with concomitant type 2 diabetes:  Patient's blood pressure is not at goal.  Patient's blood pressure is currently being managed by primary team.  But since her systolic blood pressure  on repeat measurement are around 170 mmHg we will further uptitrate her blood pressure pills.  We will start spironolactone 12.5 mg p.o. every morning. She will need repeat BMP in 1 week to recheck Cr and Mg.   I have asked her to invest in a blood pressure cuff and to keep a log and to show it to her PCP.  She is asked to make a follow-up appointment to see Dr. Karlton Lemon for further medication titration.  Low-salt diet recommended  Non-insulin-dependent diabetes mellitus type 2: Currently managed by primary care provider.  Most recent hemoglobin A1c reviewed.  Obesity, due to excess calories: Body mass index is 46.05 kg/m. . I reviewed with the patient the importance of diet, regular physical activity/exercise, weight loss.   . Patient is educated on increasing physical activity gradually as tolerated.  With the goal of moderate intensity exercise for 30 minutes a day 5 days a week.  FINAL MEDICATION LIST END OF ENCOUNTER: Meds ordered this encounter  Medications  . spironolactone (ALDACTONE) 25 MG tablet    Sig: Take 0.5 tablets (12.5 mg total)  by mouth in the morning for 90 doses.    Dispense:  45 tablet    Refill:  0     Current Outpatient Medications:  .  acetaminophen (TYLENOL) 500 MG tablet, Take 1,000 mg by mouth 2 (two) times daily., Disp: , Rfl:  .  albuterol (PROVENTIL HFA;VENTOLIN HFA) 108 (90 BASE) MCG/ACT inhaler, Inhale 2 puffs into the lungs every 6 (six) hours as needed for wheezing or shortness of breath. , Disp: , Rfl:  .  amLODipine (NORVASC) 10 MG tablet, Take 10 mg by mouth daily. , Disp: , Rfl:  .  Ascorbic Acid (VITAMIN C) 1000 MG tablet, Take 1,000 mg by mouth daily., Disp: , Rfl:  .  aspirin EC 81 MG tablet, Take 81 mg by mouth daily., Disp: , Rfl:  .  Calcium Carb-Cholecalciferol (CALCIUM + D3 PO), Take 1 tablet by mouth 2 (two) times daily., Disp: , Rfl:  .  celecoxib (CELEBREX) 200 MG capsule, Take 1 capsule (200 mg total) by mouth daily., Disp: 30 capsule, Rfl: 3 .  cetirizine (ZYRTEC) 10 MG tablet, Take 10 mg by mouth daily as needed for allergies. , Disp: , Rfl:  .  Cholecalciferol (VITAMIN D3) 5000 units CAPS, Take 5,000 Units by mouth daily., Disp: , Rfl:  .  Clobetasol Prop Emollient Base (CLOBETASOL PROPIONATE E) 0.05 % emollient cream, Apply 1 application topically 2 (two) times daily., Disp: , Rfl:  .  Coenzyme Q10 (CO Q 10 PO), Take 1 tablet by mouth daily., Disp: , Rfl:  .  colesevelam (WELCHOL) 625 MG tablet, Take 1,875 mg by mouth 3 (three) times daily., Disp: , Rfl:  .  cyanocobalamin 2000 MCG tablet, Take 2,000 mcg by mouth daily., Disp: , Rfl:  .  diclofenac sodium (VOLTAREN) 1 % GEL, Apply 2 g topically 3 (three) times daily as needed (pain)., Disp: , Rfl:  .  esomeprazole (NEXIUM) 40 MG capsule, Take 40 mg by mouth daily before breakfast., Disp: , Rfl:  .  fluticasone (FLONASE) 50 MCG/ACT nasal spray, Place 2 sprays into both nostrils daily. (Patient taking differently: Place 1 spray into both nostrils 2 (two) times daily as needed for rhinitis. ), Disp: 16 g, Rfl: 6 .  Magnesium 400 MG  CAPS, Take 400 mg by mouth every evening., Disp: , Rfl:  .  metFORMIN (GLUCOPHAGE) 500 MG tablet, Take 500 mg by mouth 2 (two)  times daily with a meal. , Disp: , Rfl:  .  mometasone-formoterol (DULERA) 100-5 MCG/ACT AERO, Inhale 2 puffs into the lungs 2 (two) times daily., Disp: , Rfl:  .  Multiple Vitamins-Minerals (MULTIVITAMIN WITH MINERALS) tablet, Take 1 tablet by mouth daily. Alive, Disp: , Rfl:  .  Omega 3 1000 MG CAPS, Take 1,000 mg by mouth daily., Disp: , Rfl:  .  omega-3 acid ethyl esters (LOVAZA) 1 g capsule, Take by mouth 2 (two) times daily., Disp: , Rfl:  .  Polyethyl Glycol-Propyl Glycol (SYSTANE OP), Place 1 drop into both eyes daily as needed (for irritation)., Disp: , Rfl:  .  Probiotic Product (ALIGN) 4 MG CAPS, Take 4 mg by mouth daily., Disp: , Rfl:  .  valsartan-hydrochlorothiazide (DIOVAN-HCT) 320-25 MG per tablet, Take 1 tablet by mouth daily., Disp: , Rfl:  .  vitamin E 400 UNIT capsule, Take 400 Units by mouth daily., Disp: , Rfl:  .  chlorpheniramine-HYDROcodone (TUSSIONEX PENNKINETIC ER) 10-8 MG/5ML LQCR, Take 5 mLs by mouth at bedtime as needed. (Patient not taking: Reported on 05/27/2016), Disp: 70 mL, Rfl: 0 .  spironolactone (ALDACTONE) 25 MG tablet, Take 0.5 tablets (12.5 mg total) by mouth in the morning for 90 doses., Disp: 45 tablet, Rfl: 0  Orders Placed This Encounter  Procedures  . Basic Metabolic Panel (BMET)  . Magnesium  . B Nat Peptide  . Basic metabolic panel  . Magnesium  . PCV MYOCARDIAL PERFUSION WITH LEXISCAN  . EKG 12-Lead  . PCV ECHOCARDIOGRAM COMPLETE    There are no Patient Instructions on file for this visit.   --Continue cardiac medications as reconciled in final medication list. --Return in about 6 weeks (around 02/07/2020) for re-evaluation of symptoms., review test results.. Or sooner if needed. --Continue follow-up with your primary care physician regarding the management of your other chronic comorbid conditions.  Patient's  questions and concerns were addressed to her satisfaction. She voices understanding of the instructions provided during this encounter.   This note was created using a voice recognition software as a result there may be grammatical errors inadvertently enclosed that do not reflect the nature of this encounter. Every attempt is made to correct such errors.  Rex Kras, [place], [facility]  Pager: [phone] Office: [phone]

## 2019-12-28 LAB — BASIC METABOLIC PANEL
BUN/Creatinine Ratio: 16 (ref 12–28)
BUN: 15 mg/dL (ref 8–27)
CO2: 25 mmol/L (ref 20–29)
Calcium: 10 mg/dL (ref 8.7–10.3)
Chloride: 101 mmol/L (ref 96–106)
Creatinine, Ser: 0.93 mg/dL (ref 0.57–1.00)
GFR calc Af Amer: 74 mL/min/{1.73_m2} (ref >59)
GFR calc non Af Amer: 64 mL/min/{1.73_m2} (ref >59)
Glucose: 100 mg/dL — ABNORMAL HIGH (ref 65–99)
Potassium: 4.2 mmol/L (ref 3.5–5.2)
Sodium: 141 mmol/L (ref 134–144)

## 2019-12-28 LAB — BRAIN NATRIURETIC PEPTIDE: BNP: <2.5 pg/mL (ref 0.0–100.0)

## 2019-12-28 LAB — MAGNESIUM: Magnesium: 1.8 mg/dL (ref 1.6–2.3)

## 2019-12-30 ENCOUNTER — Ambulatory Visit: Payer: Medicare Other

## 2019-12-30 ENCOUNTER — Other Ambulatory Visit: Payer: Self-pay

## 2019-12-30 DIAGNOSIS — R0600 Dyspnea, unspecified: Secondary | ICD-10-CM

## 2019-12-30 DIAGNOSIS — R0609 Other forms of dyspnea: Secondary | ICD-10-CM

## 2020-01-04 LAB — BASIC METABOLIC PANEL
BUN/Creatinine Ratio: 19 (ref 12–28)
BUN: 18 mg/dL (ref 8–27)
CO2: 24 mmol/L (ref 20–29)
Calcium: 9.7 mg/dL (ref 8.7–10.3)
Chloride: 97 mmol/L (ref 96–106)
Creatinine, Ser: 0.93 mg/dL (ref 0.57–1.00)
GFR calc Af Amer: 74 mL/min/{1.73_m2} (ref >59)
GFR calc non Af Amer: 64 mL/min/{1.73_m2} (ref >59)
Glucose: 131 mg/dL — ABNORMAL HIGH (ref 65–99)
Potassium: 4.2 mmol/L (ref 3.5–5.2)
Sodium: 137 mmol/L (ref 134–144)

## 2020-01-04 LAB — MAGNESIUM: Magnesium: 2 mg/dL (ref 1.6–2.3)

## 2020-01-17 ENCOUNTER — Other Ambulatory Visit: Payer: Self-pay

## 2020-01-17 ENCOUNTER — Ambulatory Visit: Payer: Medicare Other

## 2020-01-17 DIAGNOSIS — R0609 Other forms of dyspnea: Secondary | ICD-10-CM

## 2020-01-17 DIAGNOSIS — R0600 Dyspnea, unspecified: Secondary | ICD-10-CM

## 2020-01-25 ENCOUNTER — Other Ambulatory Visit: Payer: Self-pay

## 2020-01-25 ENCOUNTER — Encounter: Payer: Self-pay | Admitting: Cardiology

## 2020-01-25 ENCOUNTER — Ambulatory Visit: Payer: Medicare Other | Admitting: Cardiology

## 2020-01-25 VITALS — BP 144/89 | HR 95 | Resp 16 | Ht 67.0 in | Wt 292.0 lb

## 2020-01-25 DIAGNOSIS — Z6841 Body Mass Index (BMI) 40.0 and over, adult: Secondary | ICD-10-CM

## 2020-01-25 DIAGNOSIS — I10 Essential (primary) hypertension: Secondary | ICD-10-CM

## 2020-01-25 DIAGNOSIS — Z87891 Personal history of nicotine dependence: Secondary | ICD-10-CM

## 2020-01-25 DIAGNOSIS — E119 Type 2 diabetes mellitus without complications: Secondary | ICD-10-CM

## 2020-01-25 DIAGNOSIS — Z712 Person consulting for explanation of examination or test findings: Secondary | ICD-10-CM

## 2020-01-25 DIAGNOSIS — R0602 Shortness of breath: Secondary | ICD-10-CM

## 2020-01-25 NOTE — Progress Notes (Signed)
Date:  01/25/2020   ID:  Breanna Ross, DOB Oct 26, 1950, MRN [id number]  PCP:  Willey Blade, MD  Cardiologist:  Rex Kras, DO, [facility] (established care 12/27/2019) Former Cardiology Providers: Dr. Adrian Prows  REASON FOR CONSULT: Other forms of dyspnea   REQUESTING PHYSICIAN:  Willey Blade, MD [address] [place],  [place] [address]  Chief Complaint  Patient presents with  . Shortness of Breath  . Follow-up    4 week    HPI  Breanna Ross is a 69 y.o. female who presents to the office with a chief complaint of reevaluation of shortness of breath and review test results.  Patient's past medical history and cardiac risk factors include: Hyperlipidemia, hypertension, non-insulin-dependent diabetes mellitus type 2, sleep apnea not on CPAP, obesity due to excess calories, history of carpal tunnel syndrome, postmenopausal female, advanced age.  She was referred to the office at the request of her primary care physician back in June 2021 for evaluation of dyspnea.   During the past visits patient is noted that her shortness of breath started back in April 2021 at both with rest and with effort related activities.  Because of shortness of breath remained stable and not resolving she was recommended to follow-up with cardiology for further evaluation.  At last office visit she was noted to have elevated systolic blood pressures 841 mmHg and therefore she was started on Aldactone 12.5 mg p.o. daily and has tolerated the medication well and her blood pressure has improved.  She had baseline blood work which noted BNP within normal limits.  And also was recommended to have an echo and stress test.  Echocardiogram notes a preserved left ventricular systolic function with normal diastolic filling pattern and normal left atrial pressure.  And nuclear stress test reports normal myocardial perfusion and overall low risk study.  Clinically since last office visit patient states that  she is doing well.  She denies any chest pain at rest or with effort related activities.  Her shortness of breath is improved significantly almost by 90%.  She states that she does experience effort related dyspnea only when she goes out for a prolonged period of time.  She states that since last office visit she and her son go out to the mall to walk and is during those episodes when she has shortness of breath.  She has reduced her salt in her diet and is trying to be more physically active.  Denies prior history of coronary artery disease, myocardial infarction, congestive heart failure, deep venous thrombosis, pulmonary embolism, stroke, transient ischemic attack.  FUNCTIONAL STATUS:  No structured exercise program or daily routine.    ALLERGIES: Allergies  Allergen Reactions  . Dilaudid [Hydromorphone Hcl] Other (See Comments)    Hallucinations   . Penicillins Swelling    SWELLING REACTION UNSPECIFIED   Has patient had a PCN reaction causing immediate rash, facial/tongue/throat swelling, SOB or lightheadedness with hypotension:   # # YES # #  Has patient had a PCN reaction causing severe rash involving mucus membranes or skin necrosis: No Has patient had a PCN reaction that required hospitalization No Has patient had a PCN reaction occurring within the last 10 years:No If all of the above answers are "NO", then may proceed with Cephalosporin use.   Marland Kitchen Oxycodone Nausea Only    MEDICATION LIST PRIOR TO VISIT: Current Meds  Medication Sig  . acetaminophen (TYLENOL) 500 MG tablet Take 1,000 mg by mouth 2 (two) times  daily as needed.   Marland Kitchen albuterol (PROVENTIL HFA;VENTOLIN HFA) 108 (90 BASE) MCG/ACT inhaler Inhale 2 puffs into the lungs every 6 (six) hours as needed for wheezing or shortness of breath.   Marland Kitchen amLODipine (NORVASC) 10 MG tablet Take 10 mg by mouth daily.   . Ascorbic Acid (VITAMIN C) 1000 MG tablet Take 1,000 mg by mouth daily.  Marland Kitchen aspirin EC 81 MG tablet Take 81 mg by mouth  daily.  . Calcium Carb-Cholecalciferol (CALCIUM + D3 PO) Take 1 tablet by mouth 2 (two) times daily.  . celecoxib (CELEBREX) 200 MG capsule Take 1 capsule (200 mg total) by mouth daily. (Patient taking differently: Take 200 mg by mouth daily as needed. )  . cetirizine (ZYRTEC) 10 MG tablet Take 10 mg by mouth daily as needed for allergies.   . Cholecalciferol (VITAMIN D3) 5000 units CAPS Take 5,000 Units by mouth daily.  . Clobetasol Prop Emollient Base (CLOBETASOL PROPIONATE E) 0.05 % emollient cream Apply 1 application topically 2 (two) times daily.  . Coenzyme Q10 (CO Q 10 PO) Take 1 tablet by mouth daily.  . colesevelam (WELCHOL) 625 MG tablet Take 1,875 mg by mouth 2 (two) times daily with a meal.   . cyanocobalamin 2000 MCG tablet Take 2,000 mcg by mouth daily.  . diclofenac sodium (VOLTAREN) 1 % GEL Apply 2 g topically 3 (three) times daily as needed (pain).  Marland Kitchen esomeprazole (NEXIUM) 40 MG capsule Take 40 mg by mouth daily before breakfast.  . fluticasone (FLONASE) 50 MCG/ACT nasal spray Place 2 sprays into both nostrils daily. (Patient taking differently: Place 1 spray into both nostrils 2 (two) times daily as needed for rhinitis. )  . Magnesium 400 MG CAPS Take 400 mg by mouth every evening.  . metFORMIN (GLUCOPHAGE) 500 MG tablet Take 500 mg by mouth 2 (two) times daily with a meal.   . mometasone-formoterol (DULERA) 100-5 MCG/ACT AERO Inhale 2 puffs into the lungs 2 (two) times daily.  . Multiple Vitamins-Minerals (MULTIVITAMIN WITH MINERALS) tablet Take 1 tablet by mouth daily. Alive  . omega-3 acid ethyl esters (LOVAZA) 1 g capsule Take by mouth 2 (two) times daily.  Marland Kitchen spironolactone (ALDACTONE) 25 MG tablet Take 0.5 tablets (12.5 mg total) by mouth in the morning for 90 doses.  . valsartan-hydrochlorothiazide (DIOVAN-HCT) 320-25 MG per tablet Take 1 tablet by mouth daily.     PAST MEDICAL HISTORY: Past Medical History:  Diagnosis Date  . Arthritis    BIlateral Knees (06/10/2016)   . Asthma   . GERD (gastroesophageal reflux disease)   . Hypercholesteremia   . Hypertension   . Primary localized osteoarthritis of right knee   . Shortness of breath    with exertion  . Sleep apnea    unable to use cpap (06/10/2016)  . Type 2 diabetes mellitus ([place])     PAST SURGICAL HISTORY: Past Surgical History:  Procedure Laterality Date  . COLONOSCOPY    . HEMORRHOID SURGERY    . JOINT REPLACEMENT    . KNEE ARTHROSCOPY W/ MENISCECTOMY Left 09-29-09  . REDUCTION MAMMAPLASTY Bilateral 1998  . SHOULDER ARTHROSCOPY W/ ROTATOR CUFF REPAIR Bilateral 2007  . TOTAL KNEE ARTHROPLASTY Left 06/21/2013   Dr Noemi Chapel  . TOTAL KNEE ARTHROPLASTY Left 06/21/2013   Procedure: TOTAL KNEE ARTHROPLASTY- left;  Surgeon: Lorn Junes, MD;  Location: [facility];  Service: Orthopedics;  Laterality: Left;  . TOTAL KNEE ARTHROPLASTY Right 04/11/2014   Procedure: TOTAL KNEE ARTHROPLASTY;  Surgeon: Lorn Junes, MD;  Location: [place];  Service: Orthopedics;  Laterality: Right;  . TOTAL KNEE REVISION Left 06/10/2016   Procedure: TOTAL KNEE REVISION;  Surgeon: Frederik Pear, MD;  Location: [facility];  Service: Orthopedics;  Laterality: Left;  . TUBAL LIGATION      FAMILY HISTORY: The patient family history includes Diabetes in her brother, brother, brother, and sister; Heart attack in her father; Hypertension in her father, mother, sister, sister, sister, sister, and sister; Stroke in her brother and mother.  SOCIAL HISTORY:  The patient  reports that she quit smoking about 13 years ago. Her smoking use included cigarettes. She has a 37.00 pack-year smoking history. She has never used smokeless tobacco. She reports current alcohol use. She reports that she does not use drugs.  REVIEW OF SYSTEMS: Review of Systems  Constitutional: Negative for chills and fever.  HENT: Negative for hoarse voice and nosebleeds.   Eyes: Negative for discharge, double vision and pain.  Cardiovascular: Negative for chest pain,  claudication, dyspnea on exertion, leg swelling, near-syncope, orthopnea, palpitations, paroxysmal nocturnal dyspnea and syncope.  Respiratory: Positive for shortness of breath (improving). Negative for hemoptysis.   Musculoskeletal: Positive for back pain and joint pain. Negative for muscle cramps and myalgias.  Gastrointestinal: Negative for abdominal pain, constipation, diarrhea, hematemesis, hematochezia, melena, nausea and vomiting.  Neurological: Negative for dizziness and light-headedness.    PHYSICAL EXAM: Vitals with BMI 01/25/2020 01/25/2020 12/27/2019  Height - '5\' 7"'  '5\' 7"'   Weight - 292 lbs 294 lbs  BMI - 66.29 47.65  Systolic 465 035 465  Diastolic 89 681 275  Pulse 95 95 90   CONSTITUTIONAL: Well-developed and well-nourished. No acute distress.  SKIN: Skin is warm and dry. No rash noted. No cyanosis. No pallor. No jaundice HEAD: Normocephalic and atraumatic.  EYES: No scleral icterus MOUTH/THROAT: Moist oral membranes.  NECK: No JVD present. No thyromegaly noted. No carotid bruits  LYMPHATIC: No visible cervical adenopathy.  CHEST Normal respiratory effort. No intercostal retractions  LUNGS: Clear to auscultation bilaterally. No stridor. No wheezes. No rales.  CARDIOVASCULAR: Regular rate and rhythm, positive S1-S2, no murmurs rubs or gallops appreciated. ABDOMINAL: Obese, soft, nontender, nondistended, positive bowel sounds all 4 quadrants. No apparent ascites.  EXTREMITIES: Bilateral trace pitting peripheral edema.  HEMATOLOGIC: No significant bruising NEUROLOGIC: Oriented to person, place, and time. Nonfocal. Normal muscle tone.  PSYCHIATRIC: Normal mood and affect. Normal behavior. Cooperative  CARDIAC DATABASE: EKG: 12/27/2019: Normal sinus rhythm, 94 bpm, PACs, poor R wave progression, without underlying ischemia or injury pattern.  Echocardiogram: 12/30/2019: LVEF 55 to 60%, moderate LVH, normal diastolic filling pattern, normal LAP, mild TR.  Stress  Testing: Lexiscan (Walking with mod Bruce)Tetrofosmin Stress Test 01/17/2020:  Myocardial perfusion is normal.  Overall LV systolic function is normal without regional wall motion abnormalities. Stress LV EF calculated: 49%. Visually appears normal.  No previous exam available for comparison. Low risk study.   Heart Catheterization:  None  LABORATORY DATA:  External Labs: Collected: 10/27/2019 Creatinine 0.86 mg/dL. eGFR: 81 mL/min per 1.73 m Lipid profile: Total cholesterol 190, triglycerides 100, HDL 51, LDL 121 Hemoglobin A1c: 6.6  BMP Latest Ref Rng & Units 01/03/2020 12/27/2019 06/11/2016  Glucose 65 - 99 mg/dL 131(H) 100(H) 172(H)  BUN 8 - 27 mg/dL '18 15 11  ' Creatinine 0.57 - 1.00 mg/dL 0.93 0.93 0.87  BUN/Creat Ratio 12 - '28 19 16 ' -  Sodium 134 - 144 mmol/L 137 141 136  Potassium 3.5 - 5.2 mmol/L 4.2 4.2 3.9  Chloride 96 -  106 mmol/L 97 101 100(L)  CO2 20 - 29 mmol/L '24 25 28  ' Calcium 8.7 - 10.3 mg/dL 9.7 10.0 8.7(L)   BNP    Component Value Date/Time   BNP <2.5 12/27/2019 1424    IMPRESSION:    ICD-10-CM   1. SOB (shortness of breath)  R06.02   2. Encounter to discuss test results  Z71.2   3. Non-insulin dependent type 2 diabetes mellitus ([place])  E11.9   4. Benign hypertension  I10   5. Former smoker  Z87.891   69. Class 3 severe obesity due to excess calories with serious comorbidity and body mass index (BMI) of 45.0 to 49.9 in adult [facility])  E66.01    Z68.42      RECOMMENDATIONS: KATALEAH BEJAR is a 69 y.o. female whose past medical history and cardiac risk factors include: Hyperlipidemia, hypertension, non-insulin-dependent diabetes mellitus type 2, sleep apnea not on CPAP, obesity due to excess calories, history of carpal tunnel syndrome, postmenopausal female, advanced age.  Shortness of breath and effort related dyspnea: Improving.  Given her prolonged symptoms of effort related dyspnea and she underwent an ischemic evaluation including an echo and stress  test.  Results were reviewed with the patient in great detail at today's office visit as noted above for further reference.  Patient was started on Aldactone 12.5 mg p.o. daily at the last office visit as she had elevated blood pressures and possible repeat blood pressure readings.  She tolerated the initiation of Aldactone well without any side effects or intolerances.  Independently also reviewed her follow-up lab results with the patient.  I have asked her to follow-up with her primary care physician for additional antihypertensive medication titration.  BNP within normal limits.    Chest x[facility] at [facility] performed on October 27, 2019 reported no active cardiopulmonary disease.  Also informed the patient that she may consider a pulmonary evaluation given her history of smoking and asthma as the may also be contributing to her overall symptoms.  Benign essential hypertension with concomitant type 2 diabetes:  Patient's blood pressure is improving compared to last office visit, but currently not at goal.  Given the fact that she is a diabetic would recommend a systolic blood pressure around 130 mmHg if able to tolerate.    Currently being managed by her PCP.    Low-salt diet recommended.    Non-insulin-dependent diabetes mellitus type 2: Currently managed by primary care provider.  Most recent hemoglobin A1c reviewed.  Obesity, due to excess calories: Body mass index is 45.73 kg/m. . I reviewed with the patient the importance of diet, regular physical activity/exercise, weight loss.   . Patient is educated on increasing physical activity gradually as tolerated.  With the goal of moderate intensity exercise for 30 minutes a day 5 days a week.  Would like to see her back in close follow-up in 6 months or sooner if needed  FINAL MEDICATION LIST END OF ENCOUNTER: No orders of the defined types were placed in this encounter.    Current Outpatient Medications:  .  acetaminophen  (TYLENOL) 500 MG tablet, Take 1,000 mg by mouth 2 (two) times daily as needed. , Disp: , Rfl:  .  albuterol (PROVENTIL HFA;VENTOLIN HFA) 108 (90 BASE) MCG/ACT inhaler, Inhale 2 puffs into the lungs every 6 (six) hours as needed for wheezing or shortness of breath. , Disp: , Rfl:  .  amLODipine (NORVASC) 10 MG tablet, Take 10 mg by mouth daily. , Disp: , Rfl:  .  Ascorbic Acid (VITAMIN C) 1000 MG tablet, Take 1,000 mg by mouth daily., Disp: , Rfl:  .  aspirin EC 81 MG tablet, Take 81 mg by mouth daily., Disp: , Rfl:  .  Calcium Carb-Cholecalciferol (CALCIUM + D3 PO), Take 1 tablet by mouth 2 (two) times daily., Disp: , Rfl:  .  celecoxib (CELEBREX) 200 MG capsule, Take 1 capsule (200 mg total) by mouth daily. (Patient taking differently: Take 200 mg by mouth daily as needed. ), Disp: 30 capsule, Rfl: 3 .  cetirizine (ZYRTEC) 10 MG tablet, Take 10 mg by mouth daily as needed for allergies. , Disp: , Rfl:  .  Cholecalciferol (VITAMIN D3) 5000 units CAPS, Take 5,000 Units by mouth daily., Disp: , Rfl:  .  Clobetasol Prop Emollient Base (CLOBETASOL PROPIONATE E) 0.05 % emollient cream, Apply 1 application topically 2 (two) times daily., Disp: , Rfl:  .  Coenzyme Q10 (CO Q 10 PO), Take 1 tablet by mouth daily., Disp: , Rfl:  .  colesevelam (WELCHOL) 625 MG tablet, Take 1,875 mg by mouth 2 (two) times daily with a meal. , Disp: , Rfl:  .  cyanocobalamin 2000 MCG tablet, Take 2,000 mcg by mouth daily., Disp: , Rfl:  .  diclofenac sodium (VOLTAREN) 1 % GEL, Apply 2 g topically 3 (three) times daily as needed (pain)., Disp: , Rfl:  .  esomeprazole (NEXIUM) 40 MG capsule, Take 40 mg by mouth daily before breakfast., Disp: , Rfl:  .  fluticasone (FLONASE) 50 MCG/ACT nasal spray, Place 2 sprays into both nostrils daily. (Patient taking differently: Place 1 spray into both nostrils 2 (two) times daily as needed for rhinitis. ), Disp: 16 g, Rfl: 6 .  Magnesium 400 MG CAPS, Take 400 mg by mouth every evening., Disp:  , Rfl:  .  metFORMIN (GLUCOPHAGE) 500 MG tablet, Take 500 mg by mouth 2 (two) times daily with a meal. , Disp: , Rfl:  .  mometasone-formoterol (DULERA) 100-5 MCG/ACT AERO, Inhale 2 puffs into the lungs 2 (two) times daily., Disp: , Rfl:  .  Multiple Vitamins-Minerals (MULTIVITAMIN WITH MINERALS) tablet, Take 1 tablet by mouth daily. Alive, Disp: , Rfl:  .  omega-3 acid ethyl esters (LOVAZA) 1 g capsule, Take by mouth 2 (two) times daily., Disp: , Rfl:  .  spironolactone (ALDACTONE) 25 MG tablet, Take 0.5 tablets (12.5 mg total) by mouth in the morning for 90 doses., Disp: 45 tablet, Rfl: 0 .  valsartan-hydrochlorothiazide (DIOVAN-HCT) 320-25 MG per tablet, Take 1 tablet by mouth daily., Disp: , Rfl:   No orders of the defined types were placed in this encounter.   There are no Patient Instructions on file for this visit.   --Continue cardiac medications as reconciled in final medication list. --Return in about 6 months (around 07/27/2020) for re-evaluation of symptoms.. Or sooner if needed. --Continue follow-up with your primary care physician regarding the management of your other chronic comorbid conditions.  Patient's questions and concerns were addressed to her satisfaction. She voices understanding of the instructions provided during this encounter.   This note was created using a voice recognition software as a result there may be grammatical errors inadvertently enclosed that do not reflect the nature of this encounter. Every attempt is made to correct such errors.  Rex Kras, [place], [facility]  Pager: [phone] Office: [phone]

## 2020-03-23 ENCOUNTER — Other Ambulatory Visit: Payer: Self-pay | Admitting: Cardiology

## 2020-03-23 DIAGNOSIS — I10 Essential (primary) hypertension: Secondary | ICD-10-CM

## 2020-03-23 DIAGNOSIS — R0602 Shortness of breath: Secondary | ICD-10-CM

## 2020-05-20 ENCOUNTER — Ambulatory Visit: Payer: Medicare Other | Attending: Internal Medicine

## 2020-05-20 DIAGNOSIS — Z23 Encounter for immunization: Secondary | ICD-10-CM

## 2020-05-20 NOTE — Progress Notes (Signed)
   Covid-19 Vaccination Clinic  Name:  KONNI KESINGER    MRN: [id number] DOB: 12-16-1950  05/20/2020  Ms. Volpe was observed post Covid-19 immunization for 15 minutes without incident. She was provided with Vaccine Information Sheet and instruction to access the V-Safe system.   Ms. Ewton was instructed to call 911 with any severe reactions post vaccine: Marland Kitchen Difficulty breathing  . Swelling of face and throat  . A fast heartbeat  . A bad rash all over body  . Dizziness and weakness

## 2020-06-19 ENCOUNTER — Other Ambulatory Visit: Payer: Self-pay | Admitting: Cardiology

## 2020-06-19 DIAGNOSIS — R0602 Shortness of breath: Secondary | ICD-10-CM

## 2020-06-19 DIAGNOSIS — I10 Essential (primary) hypertension: Secondary | ICD-10-CM

## 2020-07-31 ENCOUNTER — Ambulatory Visit: Payer: Medicare Other | Admitting: Cardiology

## 2020-08-30 DIAGNOSIS — M65949 Unspecified synovitis and tenosynovitis, unspecified hand: Secondary | ICD-10-CM | POA: Insufficient documentation

## 2020-09-07 ENCOUNTER — Ambulatory Visit
Admission: EM | Admit: 2020-09-07 | Discharge: 2020-09-07 | Disposition: A | Discharge status: Home / Self Care | Payer: Medicare Other | Attending: Family Medicine | Admitting: Family Medicine

## 2020-09-07 ENCOUNTER — Other Ambulatory Visit: Payer: Self-pay

## 2020-09-07 DIAGNOSIS — I10 Essential (primary) hypertension: Secondary | ICD-10-CM

## 2020-09-07 DIAGNOSIS — J069 Acute upper respiratory infection, unspecified: Secondary | ICD-10-CM | POA: Diagnosis not present

## 2020-09-07 DIAGNOSIS — R062 Wheezing: Secondary | ICD-10-CM

## 2020-09-07 MED ORDER — BENZONATATE 100 MG PO CAPS: ORAL_CAPSULE | ORAL | 0 refills | Status: DC

## 2020-09-07 MED ORDER — PREDNISONE 20 MG PO TABS: 40.0000 mg | ORAL_TABLET | Freq: Every day | ORAL | 0 refills | Status: DC

## 2020-09-07 NOTE — ED Provider Notes (Signed)
[facility]   [id number] 09/07/20 Arrival Time: 1221  ASSESSMENT & PLAN:  1. Viral URI with cough   2. Wheezing   3. Elevated blood pressure reading in office with diagnosis of hypertension     COVID-19 testing sent. See letter/work note on file for self-isolation guidelines. OTC symptom care as needed.  Meds ordered this encounter  Medications  . benzonatate (TESSALON) 100 MG capsule    Sig: Take 1 capsule by mouth every 8 (eight) hours for cough.    Dispense:  21 capsule    Refill:  0  . predniSONE (DELTASONE) 20 MG tablet    Sig: Take 2 tablets (40 mg total) by mouth daily.    Dispense:  10 tablet    Refill:  0     Discharge Instructions      You have been tested for COVID-19 today. If your test returns positive, you will receive a phone call from [facility] regarding your results. Negative test results are not called. Both positive and negative results area always visible on MyChart. If you do not have a MyChart account, sign up instructions are provided in your discharge papers. Please do not hesitate to contact us should you have questions or concerns.  Your blood pressure was noted to be elevated during your visit today. If you are currently taking medication for high blood pressure, please ensure you are taking this as directed. If you do not have a history of high blood pressure and your blood pressure remains persistently elevated, you may need to begin taking a medication at some point. You may return here within the next few days to recheck if unable to see your primary care provider or if you do not have a one.  BP (!) 176/100 (BP Location: Left Arm)   Pulse (!) 103   Temp 99 F (37.2 C) (Oral)   Resp 20   SpO2 95%   BP Readings from Last 3 Encounters:  09/07/20 (!) 176/100  01/25/20 (!) 144/89  12/27/19 (!) 170/112          Follow-up Information    [name], MD.   Specialty: Internal Medicine Why: As needed. Contact  information: [address] [place] [address] [phone]               Reviewed expectations re: course of current medical issues. Questions answered. Outlined signs and symptoms indicating need for more acute intervention. Understanding verbalized. After Visit Summary given.   SUBJECTIVE: History from: patient. Breanna Ross is a 70 y.o. female who presents with worries regarding COVID-19. Known COVID-19 contact: none. Recent travel: none. Reports: significant cough; abrupt onset; 3 days; mild ST and otalgia/pressure. Denies: fever and difficulty breathing. Normal PO intake without n/v/d.  Increased blood pressure noted today. Reports that she is treated for HTN. She reports no chest pain on exertion, no dyspnea on exertion, no swelling of ankles, no orthostatic dizziness or lightheadedness and no orthopnea or paroxysmal nocturnal dyspnea.    OBJECTIVE:  Vitals:   09/07/20 1338 09/07/20 1405  BP: (!) 176/100   Pulse: (!) 103   Resp: 20   Temp: 99 F (37.2 C)   TempSrc: Oral   SpO2: 95% 95%    Slight tachycardia noted.  General appearance: alert; no distress Eyes: PERRLA; EOMI; conjunctiva normal HENT: [place]; AT; with nasal congestion Neck: supple  Lungs: speaks full sentences without difficulty; unlabored; mild bilat wheezes; active cough Extremities: no edema Skin: warm and dry  Neurologic: normal gait Psychological: alert and cooperative; normal mood and affect  Labs:  Labs Reviewed  NOVEL CORONAVIRUS, NAA    Allergies  Allergen Reactions  . Dilaudid [Hydromorphone Hcl] Other (See Comments)    Hallucinations   . Penicillins Swelling    SWELLING REACTION UNSPECIFIED   Has patient had a PCN reaction causing immediate rash, facial/tongue/throat swelling, SOB or lightheadedness with hypotension:   # # YES # #  Has patient had a PCN reaction causing severe rash involving mucus membranes or skin necrosis: No Has patient had a PCN  reaction that required hospitalization No Has patient had a PCN reaction occurring within the last 10 years:No If all of the above answers are "NO", then may proceed with Cephalosporin use.   Marland Kitchen Oxycodone Nausea Only    Past Medical History:  Diagnosis Date  . Arthritis    BIlateral Knees (06/10/2016)  . Asthma   . GERD (gastroesophageal reflux disease)   . Hypercholesteremia   . Hypertension   . Primary localized osteoarthritis of right knee   . Shortness of breath    with exertion  . Sleep apnea    unable to use cpap (06/10/2016)  . Type 2 diabetes mellitus (HCC)    Social History   Socioeconomic History  . Marital status: Widowed    Spouse name: Not on file  . Number of children: 3  . Years of education: Not on file  . Highest education level: Not on file  Occupational History  . Not on file  Tobacco Use  . Smoking status: Former Smoker    Packs/day: 1.00    Years: 37.00    Pack years: 37.00    Types: Cigarettes    Quit date: 06/08/2006    Years since quitting: 14.2  . Smokeless tobacco: Never Used  Vaping Use  . Vaping Use: Never used  Substance and Sexual Activity  . Alcohol use: Yes    Comment: 06/10/2016 "might have a drink once/year"  . Drug use: No  . Sexual activity: Not Currently  Other Topics Concern  . Not on file  Social History Narrative  . Not on file   Social Determinants of Health   Financial Resource Strain: Not on file  Food Insecurity: Not on file  Transportation Needs: Not on file  Physical Activity: Not on file  Stress: Not on file  Social Connections: Not on file  Intimate Partner Violence: Not on file   Family History  Problem Relation Age of Onset  . Hypertension Mother   . Stroke Mother   . Hypertension Father   . Heart attack Father   . Hypertension Sister   . Hypertension Sister   . Hypertension Sister   . Hypertension Sister   . Hypertension Sister   . Diabetes Sister   . Stroke Brother   . Diabetes Brother   .  Diabetes Brother   . Diabetes Brother    Past Surgical History:  Procedure Laterality Date  . COLONOSCOPY    . HEMORRHOID SURGERY    . JOINT REPLACEMENT    . KNEE ARTHROSCOPY W/ MENISCECTOMY Left 09-29-09  . REDUCTION MAMMAPLASTY Bilateral 1998  . SHOULDER ARTHROSCOPY W/ ROTATOR CUFF REPAIR Bilateral 2007  . 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 TOTAL KNEE ARTHROPLASTY Left 06/21/2013   Procedure: TOTAL KNEE ARTHROPLASTY- left;  Surgeon: Nilda Simmer, MD;  Location: [facility] OR;  Service: Orthopedics;  Laterality: Left;  . TOTAL KNEE ARTHROPLASTY Right 04/11/2014  Procedure: TOTAL KNEE ARTHROPLASTY;  Surgeon: Nilda Simmer, MD;  Location: [facility] OR;  Service: Orthopedics;  Laterality: Right;  . TOTAL KNEE REVISION Left 06/10/2016   Procedure: TOTAL KNEE REVISION;  Surgeon: Gean Birchwood, MD;  Location: [facility] OR;  Service: Orthopedics;  Laterality: Left;  . TUBAL LIGATION       Mardella Layman, MD 09/07/20 1421

## 2020-09-07 NOTE — Discharge Instructions (Addendum)
You have been tested for COVID-19 today. If your test returns positive, you will receive a phone call from [facility] regarding your results. Negative test results are not called. Both positive and negative results area always visible on MyChart. If you do not have a MyChart account, sign up instructions are provided in your discharge papers. Please do not hesitate to contact us should you have questions or concerns.  Your blood pressure was noted to be elevated during your visit today. If you are currently taking medication for high blood pressure, please ensure you are taking this as directed. If you do not have a history of high blood pressure and your blood pressure remains persistently elevated, you may need to begin taking a medication at some point. You may return here within the next few days to recheck if unable to see your primary care provider or if you do not have a one.  BP (!) 176/100 (BP Location: Left Arm)   Pulse (!) 103   Temp 99 F (37.2 C) (Oral)   Resp 20   SpO2 95%   BP Readings from Last 3 Encounters:  09/07/20 (!) 176/100  01/25/20 (!) 144/89  12/27/19 (!) 170/112

## 2020-09-07 NOTE — ED Triage Notes (Signed)
Patient states she has had a cough since Tuesday as well as a headache. Pt has also had a sore throat and bilaterally ear pain since Wednesday. Pt is aox4 and ambulatory.

## 2020-09-09 LAB — NOVEL CORONAVIRUS, NAA: SARS-CoV-2, NAA: DETECTED — AB

## 2020-09-09 LAB — SARS-COV-2, NAA 2 DAY TAT

## 2020-09-10 ENCOUNTER — Other Ambulatory Visit: Payer: Self-pay | Admitting: Physician Assistant

## 2020-09-10 DIAGNOSIS — I10 Essential (primary) hypertension: Secondary | ICD-10-CM

## 2020-09-10 DIAGNOSIS — E1169 Type 2 diabetes mellitus with other specified complication: Secondary | ICD-10-CM

## 2020-09-10 DIAGNOSIS — U071 COVID-19: Secondary | ICD-10-CM

## 2020-09-10 NOTE — Progress Notes (Signed)
I connected by phone with Breanna Ross on 09/10/2020 at 9:54 AM to discuss the potential use of a new treatment for mild to moderate COVID-19 viral infection in non-hospitalized patients.  This patient is a 69 y.o. female that meets the FDA criteria for Emergency Use Authorization of COVID monoclonal antibody sotrovimab.  Has a (+) direct SARS-CoV-2 viral test result  Has mild or moderate COVID-19   Is NOT hospitalized due to COVID-19  Is within 10 days of symptom onset  Has at least one of the high risk factor(s) for progression to severe COVID-19 and/or hospitalization as defined in EUA.  Specific high risk criteria : Older age (>/= 69 yo), BMI > 25, Diabetes and Cardiovascular disease or hypertension   I have spoken and communicated the following to the patient or parent/caregiver regarding COVID monoclonal antibody treatment:  1. FDA has authorized the emergency use for the treatment of mild to moderate COVID-19 in adults and pediatric patients with positive results of direct SARS-CoV-2 viral testing who are 86 years of age and older weighing at least 40 kg, and who are at high risk for progressing to severe COVID-19 and/or hospitalization.  2. The significant known and potential risks and benefits of COVID monoclonal antibody, and the extent to which such potential risks and benefits are unknown.  3. Information on available alternative treatments and the risks and benefits of those alternatives, including clinical trials.  4. Patients treated with COVID monoclonal antibody should continue to self-isolate and use infection control measures (e.g., wear mask, isolate, social distance, avoid sharing personal items, clean and disinfect "high touch" surfaces, and frequent handwashing) according to CDC guidelines.   5. The patient or parent/caregiver has the option to accept or refuse COVID monoclonal antibody treatment.  After reviewing this information with the patient, the patient  has agreed to receive one of the available covid 19 monoclonal antibodies and will be provided an appropriate fact sheet prior to infusion.  Sx onset 2/15. Set up for infusion on 2/21 @ 11:30am. Directions given to [facility]. Pt is aware that insurance will be charged an infusion fee. Pt is fully vaccinated and boosted.   Cline Crock 09/10/2020 9:54 AM

## 2020-09-11 ENCOUNTER — Ambulatory Visit (HOSPITAL_COMMUNITY)
Admission: RE | Admit: 2020-09-11 | Discharge: 2020-09-11 | Disposition: A | Discharge status: Home / Self Care | Payer: Medicare Other | Source: Ambulatory Visit | Attending: Pulmonary Disease | Admitting: Pulmonary Disease

## 2020-09-11 DIAGNOSIS — E1169 Type 2 diabetes mellitus with other specified complication: Secondary | ICD-10-CM | POA: Diagnosis not present

## 2020-09-11 DIAGNOSIS — U071 COVID-19: Secondary | ICD-10-CM | POA: Diagnosis not present

## 2020-09-11 DIAGNOSIS — I10 Essential (primary) hypertension: Secondary | ICD-10-CM | POA: Diagnosis not present

## 2020-09-11 MED ORDER — SODIUM CHLORIDE 0.9 % IV SOLN: INTRAVENOUS | Status: DC | PRN

## 2020-09-11 MED ORDER — EPINEPHRINE 0.3 MG/0.3ML IJ SOAJ: 0.3000 mg | Freq: Once | INTRAMUSCULAR | Status: DC | PRN

## 2020-09-11 MED ORDER — FAMOTIDINE IN NACL 20-0.9 MG/50ML-% IV SOLN: 20.0000 mg | Freq: Once | INTRAVENOUS | Status: DC | PRN

## 2020-09-11 MED ORDER — DIPHENHYDRAMINE HCL 50 MG/ML IJ SOLN: 50.0000 mg | Freq: Once | INTRAMUSCULAR | Status: DC | PRN

## 2020-09-11 MED ORDER — ALBUTEROL SULFATE HFA 108 (90 BASE) MCG/ACT IN AERS: 2.0000 | INHALATION_SPRAY | Freq: Once | RESPIRATORY_TRACT | Status: DC | PRN

## 2020-09-11 MED ORDER — METHYLPREDNISOLONE SODIUM SUCC 125 MG IJ SOLR: 125.0000 mg | Freq: Once | INTRAMUSCULAR | Status: DC | PRN

## 2020-09-11 MED ORDER — SOTROVIMAB 500 MG/8ML IV SOLN
500.0000 mg | Freq: Once | INTRAVENOUS | Status: AC
  Administered 2020-09-11: 500 mg via INTRAVENOUS

## 2020-09-11 NOTE — Discharge Instructions (Signed)
10 Things You Can Do to Manage Your COVID-19 Symptoms at Home If you have possible or confirmed COVID-19: 1. Stay home except to get medical care. 2. Monitor your symptoms carefully. If your symptoms get worse, call your healthcare provider immediately. 3. Get rest and stay hydrated. 4. If you have a medical appointment, call the healthcare provider ahead of time and tell them that you have or may have COVID-19. 5. For medical emergencies, call 911 and notify the dispatch personnel that you have or may have COVID-19. 6. Cover your cough and sneezes with a tissue or use the inside of your elbow. 7. Wash your hands often with soap and water for at least 20 seconds or clean your hands with an alcohol-based hand sanitizer that contains at least 60% alcohol. 8. As much as possible, stay in a specific room and away from other people in your home. Also, you should use a separate bathroom, if available. If you need to be around other people in or outside of the home, wear a mask. 9. Avoid sharing personal items with other people in your household, like dishes, towels, and bedding. 10. Clean all surfaces that are touched often, like counters, tabletops, and doorknobs. Use household cleaning sprays or wipes according to the label instructions. cdc.gov/coronavirus 02/04/2020 This information is not intended to replace advice given to you by your health care provider. Make sure you discuss any questions you have with your health care provider. Document Revised: 05/22/2020 Document Reviewed: 05/22/2020 Elsevier Patient Education  2021 Elsevier Inc.  What types of side effects do monoclonal antibody drugs cause?  Common side effects  In general, the more common side effects caused by monoclonal antibody drugs include: . Allergic reactions, such as hives or itching . Flu-like signs and symptoms, including chills, fatigue, fever, and muscle aches and pains . Nausea, vomiting . Diarrhea . Skin  rashes . Low blood pressure  If you have any questions or concerns after the infusion please call the Advanced Practice Provider on call at [phone]. This number is ONLY intended for your use regarding questions or concerns about the infusion post-treatment side-effects.  Please do not provide this number to others for use. For return to work notes please contact your primary care provider.   If someone you know is interested in receiving treatment please have them contact their MD for a referral or visit [email or web address]   

## 2020-09-11 NOTE — Progress Notes (Signed)
Diagnosis: COVID-19  Physician: Dr. Patrick Wright  Procedure: [facility] Infusion [facility] Med: Sotrovimab infusion - Provided patient with sotrovimab fact sheet for patients, parents, and caregivers prior to infusion.   Complications: No immediate complications noted  Discharge: Discharged home    

## 2020-09-11 NOTE — Progress Notes (Signed)
Patient reviewed Fact Sheet for Patients, Parents, and Caregivers for Emergency Use Authorization (EUA) of sotrovimab for the Treatment of Coronavirus. Patient also reviewed and is agreeable to the estimated cost of treatment. Patient is agreeable to proceed.   

## 2020-09-27 DIAGNOSIS — M65351 Trigger finger, right little finger: Secondary | ICD-10-CM | POA: Insufficient documentation

## 2020-09-27 DIAGNOSIS — M65341 Trigger finger, right ring finger: Secondary | ICD-10-CM | POA: Insufficient documentation

## 2020-10-30 ENCOUNTER — Other Ambulatory Visit: Payer: Self-pay

## 2020-10-30 ENCOUNTER — Ambulatory Visit
Admission: RE | Admit: 2020-10-30 | Discharge: 2020-10-30 | Disposition: A | Discharge status: Home / Self Care | Payer: Medicare Other | Source: Ambulatory Visit | Attending: Internal Medicine | Admitting: Internal Medicine

## 2020-10-30 ENCOUNTER — Other Ambulatory Visit: Payer: Self-pay | Admitting: Internal Medicine

## 2020-10-30 DIAGNOSIS — R059 Cough, unspecified: Secondary | ICD-10-CM

## 2020-11-27 ENCOUNTER — Other Ambulatory Visit: Payer: Self-pay | Admitting: Obstetrics and Gynecology

## 2020-11-27 DIAGNOSIS — Z1231 Encounter for screening mammogram for malignant neoplasm of breast: Secondary | ICD-10-CM

## 2020-11-28 ENCOUNTER — Other Ambulatory Visit: Payer: Self-pay

## 2020-11-28 ENCOUNTER — Ambulatory Visit
Admission: RE | Admit: 2020-11-28 | Discharge: 2020-11-28 | Disposition: A | Discharge status: Home / Self Care | Payer: Medicare Other | Source: Ambulatory Visit | Attending: Obstetrics and Gynecology | Admitting: Obstetrics and Gynecology

## 2020-11-28 DIAGNOSIS — Z1231 Encounter for screening mammogram for malignant neoplasm of breast: Secondary | ICD-10-CM

## 2021-01-17 DIAGNOSIS — M65331 Trigger finger, right middle finger: Secondary | ICD-10-CM | POA: Insufficient documentation

## 2021-05-09 ENCOUNTER — Ambulatory Visit: Payer: Medicare Other | Admitting: Pulmonary Disease

## 2021-05-09 ENCOUNTER — Other Ambulatory Visit: Payer: Self-pay

## 2021-05-09 ENCOUNTER — Encounter: Payer: Self-pay | Admitting: Pulmonary Disease

## 2021-05-09 VITALS — BP 136/82 | HR 87 | Ht 67.0 in | Wt 292.8 lb

## 2021-05-09 DIAGNOSIS — Z87891 Personal history of nicotine dependence: Secondary | ICD-10-CM | POA: Diagnosis not present

## 2021-05-09 DIAGNOSIS — R0602 Shortness of breath: Secondary | ICD-10-CM

## 2021-05-09 MED ORDER — SPIRIVA RESPIMAT 2.5 MCG/ACT IN AERS: 2.0000 | INHALATION_SPRAY | Freq: Every day | RESPIRATORY_TRACT | 11 refills | Status: DC

## 2021-05-09 MED ORDER — SPIRIVA RESPIMAT 2.5 MCG/ACT IN AERS: 2.0000 | INHALATION_SPRAY | Freq: Every day | RESPIRATORY_TRACT | 0 refills | Status: DC

## 2021-05-09 NOTE — Patient Instructions (Addendum)
We will start you on ipratropium inhaler 2 puffs per day  Use albuterol inhaler 1-2 puffs every 4-6 hours as needed for shortness of breath or chest tightness.   We will schedule you for pulmonary function tests at your follow up visit in 1 month.

## 2021-05-09 NOTE — Progress Notes (Signed)
Patient seen in the office today and instructed on use of spiriva respimat .  Patient expressed understanding and demonstrated technique. 

## 2021-05-09 NOTE — Progress Notes (Signed)
Synopsis: Referred in October 2022 for shortness of breath by [name], MD  Subjective:   PATIENT ID: Breanna Ross GENDER: female DOB: [birth year], MRN: [id number]   HPI  Chief Complaint  Patient presents with   Shortness of Breath   New Patient (Initial Visit)   Breanna Ross is a 70 year old woman, former smoker with history of sleep apnea, DMII, GERD and asthma who is referred to pulmonary clinic for shortness of breath.   She reports having shortness of breath since February 2022 she was sick with COVID.  Her shortness of breath is worse with walking and exercise.  Along with the shortness of breath she feels that there is a clog in her chest.  She has been using throat lozenges and cough syrup without much relief of that clogged sensation in her chest.  She denies productive cough.  She denies wheezing.  She was treated with a prednisone taper which alleviated her symptoms.  She denies any sinus congestion or drainage.  She does have reflux symptoms.  She has been evaluated by cardiology for her shortness of breath, visit from 01/25/2020 reviewed.  She had normal myocardial perfusion scan and echocardiogram showed normal left ventricular function and normal diastolic pattern.  She was diagnosed with sleep apnea in 2017 and reports she was unable to tolerate CPAP therapy.  She is a former smoker and has a 38-pack-year smoking history.  She quit in 2007.  Past Medical History:  Diagnosis Date   Arthritis    BIlateral Knees (06/10/2016)   Asthma    GERD (gastroesophageal reflux disease)    Hypercholesteremia    Hypertension    Primary localized osteoarthritis of right knee    Shortness of breath    with exertion   Sleep apnea    unable to use cpap (06/10/2016)   Type 2 diabetes mellitus (HCC)      Family History  Problem Relation Age of Onset   Hypertension Mother    Stroke Mother    Hypertension Father    Heart attack Father    Hypertension Sister     Hypertension Sister    Hypertension Sister    Hypertension Sister    Hypertension Sister    Diabetes Sister    Stroke Brother    Diabetes Brother    Diabetes Brother    Diabetes Brother      Social History   Socioeconomic History   Marital status: Widowed    Spouse name: Not on file   Number of children: 3   Years of education: Not on file   Highest education level: Not on file  Occupational History   Not on file  Tobacco Use   Smoking status: Former    Packs/day: 1.00    Years: 37.00    Pack years: 37.00    Types: Cigarettes    Quit date: 06/08/2006    Years since quitting: 14.9   Smokeless tobacco: Never  Vaping Use   Vaping Use: Never used  Substance and Sexual Activity   Alcohol use: Yes    Comment: 06/10/2016 "might have a drink once/year"   Drug use: No   Sexual activity: Not Currently  Other Topics Concern   Not on file  Social History Narrative   Not on file   Social Determinants of Health   Financial Resource Strain: Not on file  Food Insecurity: Not on file  Transportation Needs: Not on file  Physical Activity: Not on file  Stress: Not  on file  Social Connections: Not on file  Intimate Partner Violence: Not on file     Allergies  Allergen Reactions   Dilaudid [Hydromorphone Hcl] Other (See Comments)    Hallucinations    Penicillins Swelling    SWELLING REACTION UNSPECIFIED   Has patient had a PCN reaction causing immediate rash, facial/tongue/throat swelling, SOB or lightheadedness with hypotension:   # # YES # #  Has patient had a PCN reaction causing severe rash involving mucus membranes or skin necrosis: No Has patient had a PCN reaction that required hospitalization No Has patient had a PCN reaction occurring within the last 10 years:No If all of the above answers are "NO", then may proceed with Cephalosporin use.    Oxycodone Nausea Only     Outpatient Medications Prior to Visit  Medication Sig Dispense Refill   acetaminophen  (TYLENOL) 500 MG tablet Take 1,000 mg by mouth 2 (two) times daily as needed.      albuterol (PROVENTIL HFA;VENTOLIN HFA) 108 (90 BASE) MCG/ACT inhaler Inhale 2 puffs into the lungs every 6 (six) hours as needed for wheezing or shortness of breath.      amLODipine (NORVASC) 10 MG tablet Take 10 mg by mouth daily.      Ascorbic Acid (VITAMIN C) 1000 MG tablet Take 1,000 mg by mouth daily.     aspirin EC 81 MG tablet Take 81 mg by mouth daily.     benzonatate (TESSALON) 100 MG capsule Take 1 capsule by mouth every 8 (eight) hours for cough. 21 capsule 0   Calcium Carb-Cholecalciferol (CALCIUM + D3 PO) Take 1 tablet by mouth 2 (two) times daily.     cetirizine (ZYRTEC) 10 MG tablet Take 10 mg by mouth daily as needed for allergies.      Cholecalciferol (VITAMIN D3) 5000 units CAPS Take 5,000 Units by mouth daily.     Clobetasol Prop Emollient Base 0.05 % emollient cream Apply 1 application topically 2 (two) times daily.     Coenzyme Q10 (CO Q 10 PO) Take 1 tablet by mouth daily.     colesevelam (WELCHOL) 625 MG tablet Take 1,875 mg by mouth 2 (two) times daily with a meal.      cyanocobalamin 2000 MCG tablet Take 2,000 mcg by mouth daily.     diclofenac sodium (VOLTAREN) 1 % GEL Apply 2 g topically 3 (three) times daily as needed (pain).     esomeprazole (NEXIUM) 40 MG capsule Take 40 mg by mouth daily before breakfast.     fluticasone (FLONASE) 50 MCG/ACT nasal spray Place 2 sprays into both nostrils daily. (Patient taking differently: Place 1 spray into both nostrils 2 (two) times daily as needed for rhinitis.) 16 g 6   Magnesium 400 MG CAPS Take 400 mg by mouth every evening.     metFORMIN (GLUCOPHAGE) 500 MG tablet Take 500 mg by mouth 2 (two) times daily with a meal.      mometasone-formoterol (DULERA) 100-5 MCG/ACT AERO Inhale 2 puffs into the lungs 2 (two) times daily.     Multiple Vitamins-Minerals (MULTIVITAMIN WITH MINERALS) tablet Take 1 tablet by mouth daily. Alive     omega-3 acid ethyl  esters (LOVAZA) 1 g capsule Take by mouth 2 (two) times daily.     predniSONE (DELTASONE) 20 MG tablet Take 2 tablets (40 mg total) by mouth daily. 10 tablet 0   spironolactone (ALDACTONE) 25 MG tablet Take 0.5 tablets (12.5 mg total) by mouth in the morning. 45 tablet 1  valsartan-hydrochlorothiazide (DIOVAN-HCT) 320-25 MG per tablet Take 1 tablet by mouth daily.     amLODipine (NORVASC) 5 MG tablet Norvasc 5 mg tablet  Take 1 tablet every day by oral route.     eszopiclone (LUNESTA) 2 MG TABS tablet eszopiclone 2 mg tablet     latanoprost (XALATAN) 0.005 % ophthalmic solution 1 drop at bedtime.     SYMBICORT 160-4.5 MCG/ACT inhaler SMARTSIG:2 Puff(s) By Mouth Twice Daily     vitamin E 180 MG (400 UNITS) capsule vitamin E (dl, acetate) 425 mg (956 unit) capsule  Take 1 capsule every day by oral route.     No facility-administered medications prior to visit.    Review of Systems  Constitutional:  Negative for chills, fever, malaise/fatigue and weight loss.  HENT:  Negative for congestion, sinus pain and sore throat.   Eyes: Negative.   Respiratory:  Positive for shortness of breath. Negative for cough, hemoptysis, sputum production and wheezing.   Cardiovascular:  Negative for chest pain, palpitations, orthopnea, claudication and leg swelling.  Gastrointestinal:  Positive for heartburn. Negative for abdominal pain, nausea and vomiting.  Genitourinary: Negative.   Musculoskeletal:  Negative for joint pain and myalgias.  Skin:  Negative for rash.  Neurological:  Negative for weakness.  Endo/Heme/Allergies: Negative.   Psychiatric/Behavioral: Negative.       Objective:   Vitals:   05/09/21 1558  BP: 136/82  Pulse: 87  SpO2: 97%  Weight: 292 lb 12.8 oz (132.8 kg)  Height: 5\' 7"  (1.702 m)     Physical Exam Constitutional:      General: She is not in acute distress.    Appearance: She is obese. She is not ill-appearing.  HENT:     Head: Normocephalic and atraumatic.  Eyes:      General: No scleral icterus.    Conjunctiva/sclera: Conjunctivae normal.     Pupils: Pupils are equal, round, and reactive to light.  Cardiovascular:     Rate and Rhythm: Normal rate and regular rhythm.     Pulses: Normal pulses.     Heart sounds: Normal heart sounds. No murmur heard. Pulmonary:     Effort: Pulmonary effort is normal.     Breath sounds: Normal breath sounds. No wheezing, rhonchi or rales.  Abdominal:     General: Bowel sounds are normal.     Palpations: Abdomen is soft.  Musculoskeletal:     Right lower leg: No edema.     Left lower leg: No edema.  Lymphadenopathy:     Cervical: No cervical adenopathy.  Skin:    General: Skin is warm and dry.  Neurological:     General: No focal deficit present.     Mental Status: She is alert.  Psychiatric:        Mood and Affect: Mood normal.        Behavior: Behavior normal.        Thought Content: Thought content normal.        Judgment: Judgment normal.    CBC    Component Value Date/Time   WBC 9.4 06/12/2016 0652   RBC 3.81 (L) 06/12/2016 0652   HGB 10.3 (L) 06/12/2016 0652   HCT 32.8 (L) 06/12/2016 0652   PLT 233 06/12/2016 0652   MCV 86.1 06/12/2016 0652   MCH 27.0 06/12/2016 0652   MCHC 31.4 06/12/2016 0652   RDW 14.4 06/12/2016 0652   LYMPHSABS 3.3 05/31/2016 1441   MONOABS 0.6 05/31/2016 1441   EOSABS 0.2 05/31/2016 1441  BASOSABS 0.0 05/31/2016 1441   BMP Latest Ref Rng & Units 01/03/2020 12/27/2019 06/11/2016  Glucose 65 - 99 mg/dL 371(I) 967(E) 938(B)  BUN 8 - 27 mg/dL 18 15 11   Creatinine 0.57 - 1.00 mg/dL 0.17 5.10  BUN/Creat Ratio 12 - 28 19 16  -  Sodium 134 - 144 mmol/L 137 141 136  Potassium 3.5 - 5.2 mmol/L 4.2 4.2 3.9  Chloride 96 - 106 mmol/L 97 101 100(L)  CO2 20 - 29 mmol/L 24 25 28   Calcium 8.7 - 10.3 mg/dL 9.7 2.58 )   Chest imaging: CXR 10/30/20 Cardiomediastinal silhouette unchanged in size and contour. No pneumothorax. No pleural effusion. Coarsened interstitial  markings similar to the prior. No pneumothorax pleural effusion or confluent airspace disease. No displaced fracture. Degenerative changes of the Spine  PFT: No flowsheet data found.  Labs:  Path:  Echo 12/30/19: Normal LV 55-60%. Moderate LV hypertropy. Normal diastolic pattern. Mild Tricuspid regurgitation.   Heart Catheterization:  Assessment & Plan:   Shortness of breath - Plan: Pulmonary Function Test  Discussion: Breanna Ross is a 70 year old woman, former smoker with history of sleep apnea, DMII, GERD and asthma who is referred to pulmonary clinic for shortness of breath.   The exact etiology of her shortness of breath is not known at this time but there is high concern for obstructive lung disease based on her smoking history.  We will trial her on Spiriva 2.5 MCG 2 puffs once daily.  She can also use albuterol inhaler 1 to 2 puffs as needed every 4-6 hours for shortness of breath or chest tightness.  We will obtain pulmonary function tests at follow-up visit in 1 month.  She does qualify for lung cancer screening based on her pack-year smoking history and we are within a 15-year window since her quit date.  We will place referral today.  02/29/20, MD [facility] Office: [phone]   See Amion for personal pager PCCM on call pager [phone] until 7pm. Please call Elink 7p-7a. [phone]   Current Outpatient Medications:    acetaminophen (TYLENOL) 500 MG tablet, Take 1,000 mg by mouth 2 (two) times daily as needed. , Disp: , Rfl:    albuterol (PROVENTIL HFA;VENTOLIN HFA) 108 (90 BASE) MCG/ACT inhaler, Inhale 2 puffs into the lungs every 6 (six) hours as needed for wheezing or shortness of breath. , Disp: , Rfl:    amLODipine (NORVASC) 10 MG tablet, Take 10 mg by mouth daily. , Disp: , Rfl:    Ascorbic Acid (VITAMIN C) 1000 MG tablet, Take 1,000 mg by mouth daily., Disp: , Rfl:    aspirin EC 81 MG tablet, Take 81 mg by mouth  daily., Disp: , Rfl:    benzonatate (TESSALON) 100 MG capsule, Take 1 capsule by mouth every 8 (eight) hours for cough., Disp: 21 capsule, Rfl: 0   Calcium Carb-Cholecalciferol (CALCIUM + D3 PO), Take 1 tablet by mouth 2 (two) times daily., Disp: , Rfl:    cetirizine (ZYRTEC) 10 MG tablet, Take 10 mg by mouth daily as needed for allergies. , Disp: , Rfl:    Cholecalciferol (VITAMIN D3) 5000 units CAPS, Take 5,000 Units by mouth daily., Disp: , Rfl:    Clobetasol Prop Emollient Base 0.05 % emollient cream, Apply 1 application topically 2 (two) times daily., Disp: , Rfl:    Coenzyme Q10 (CO Q 10 PO), Take 1 tablet by mouth daily., Disp: , Rfl:    colesevelam (WELCHOL) 625  MG tablet, Take 1,875 mg by mouth 2 (two) times daily with a meal. , Disp: , Rfl:    cyanocobalamin 2000 MCG tablet, Take 2,000 mcg by mouth daily., Disp: , Rfl:    diclofenac sodium (VOLTAREN) 1 % GEL, Apply 2 g topically 3 (three) times daily as needed (pain)., Disp: , Rfl:    esomeprazole (NEXIUM) 40 MG capsule, Take 40 mg by mouth daily before breakfast., Disp: , Rfl:    fluticasone (FLONASE) 50 MCG/ACT nasal spray, Place 2 sprays into both nostrils daily. (Patient taking differently: Place 1 spray into both nostrils 2 (two) times daily as needed for rhinitis.), Disp: 16 g, Rfl: 6   Magnesium 400 MG CAPS, Take 400 mg by mouth every evening., Disp: , Rfl:    metFORMIN (GLUCOPHAGE) 500 MG tablet, Take 500 mg by mouth 2 (two) times daily with a meal. , Disp: , Rfl:    mometasone-formoterol (DULERA) 100-5 MCG/ACT AERO, Inhale 2 puffs into the lungs 2 (two) times daily., Disp: , Rfl:    Multiple Vitamins-Minerals (MULTIVITAMIN WITH MINERALS) tablet, Take 1 tablet by mouth daily. Alive, Disp: , Rfl:    omega-3 acid ethyl esters (LOVAZA) 1 g capsule, Take by mouth 2 (two) times daily., Disp: , Rfl:    predniSONE (DELTASONE) 20 MG tablet, Take 2 tablets (40 mg total) by mouth daily., Disp: 10 tablet, Rfl: 0   spironolactone (ALDACTONE)  25 MG tablet, Take 0.5 tablets (12.5 mg total) by mouth in the morning., Disp: 45 tablet, Rfl: 1   Tiotropium Bromide Monohydrate (SPIRIVA RESPIMAT) 2.5 MCG/ACT AERS, Inhale 2 puffs into the lungs daily., Disp: 4 g, Rfl: 0   Tiotropium Bromide Monohydrate (SPIRIVA RESPIMAT) 2.5 MCG/ACT AERS, Inhale 2 puffs into the lungs daily., Disp: 4 g, Rfl: 11   valsartan-hydrochlorothiazide (DIOVAN-HCT) 320-25 MG per tablet, Take 1 tablet by mouth daily., Disp: , Rfl:    amLODipine (NORVASC) 5 MG tablet, Norvasc 5 mg tablet  Take 1 tablet every day by oral route., Disp: , Rfl:    eszopiclone (LUNESTA) 2 MG TABS tablet, eszopiclone 2 mg tablet, Disp: , Rfl:    latanoprost (XALATAN) 0.005 % ophthalmic solution, 1 drop at bedtime., Disp: , Rfl:    SYMBICORT 160-4.5 MCG/ACT inhaler, SMARTSIG:2 Puff(s) By Mouth Twice Daily, Disp: , Rfl:    vitamin E 180 MG (400 UNITS) capsule, vitamin E (dl, acetate) 659 mg (935 unit) capsule  Take 1 capsule every day by oral route., Disp: , Rfl:

## 2021-05-16 ENCOUNTER — Encounter: Payer: Self-pay | Admitting: Pulmonary Disease

## 2021-06-12 ENCOUNTER — Ambulatory Visit
Admission: RE | Admit: 2021-06-12 | Discharge: 2021-06-12 | Disposition: A | Discharge status: Home / Self Care | Payer: Medicare Other | Source: Ambulatory Visit | Attending: Internal Medicine | Admitting: Internal Medicine

## 2021-06-12 ENCOUNTER — Other Ambulatory Visit: Payer: Self-pay | Admitting: Internal Medicine

## 2021-06-12 ENCOUNTER — Other Ambulatory Visit: Payer: Self-pay

## 2021-06-12 DIAGNOSIS — R059 Cough, unspecified: Secondary | ICD-10-CM

## 2021-06-15 ENCOUNTER — Telehealth: Payer: Self-pay | Admitting: Adult Health

## 2021-06-15 NOTE — Telephone Encounter (Signed)
Called and spoke with pt who states she does not feel like her spiriva inhaler is working for her. Stated to pt that at upcoming OV with TP on 11/28 to discuss this with her to see if she might be able to be switched to a different inhaler. Stated to pt that she can use her rescue inhaler every 4-6 hours as needed and she verbalized understanding. Nothing further needed.

## 2021-06-18 ENCOUNTER — Encounter: Payer: Self-pay | Admitting: Adult Health

## 2021-06-18 ENCOUNTER — Ambulatory Visit (INDEPENDENT_AMBULATORY_CARE_PROVIDER_SITE_OTHER): Payer: Medicare Other | Admitting: Adult Health

## 2021-06-18 ENCOUNTER — Other Ambulatory Visit: Payer: Self-pay

## 2021-06-18 VITALS — BP 130/90 | HR 104 | Temp 98.4°F | Ht 67.0 in | Wt 293.2 lb

## 2021-06-18 DIAGNOSIS — J42 Unspecified chronic bronchitis: Secondary | ICD-10-CM | POA: Insufficient documentation

## 2021-06-18 DIAGNOSIS — R059 Cough, unspecified: Secondary | ICD-10-CM | POA: Diagnosis not present

## 2021-06-18 MED ORDER — ALBUTEROL SULFATE (2.5 MG/3ML) 0.083% IN NEBU
2.5000 mg | INHALATION_SOLUTION | Freq: Once | RESPIRATORY_TRACT | Status: AC
  Administered 2021-06-18: 10:00:00 2.5 mg via RESPIRATORY_TRACT

## 2021-06-18 MED ORDER — PREDNISONE 10 MG PO TABS: ORAL_TABLET | ORAL | 0 refills | Status: DC

## 2021-06-18 MED ORDER — BENZONATATE 200 MG PO CAPS: 200.0000 mg | ORAL_CAPSULE | Freq: Three times a day (TID) | ORAL | 1 refills | Status: AC | PRN

## 2021-06-18 MED ORDER — CEFDINIR 300 MG PO CAPS: 300.0000 mg | ORAL_CAPSULE | Freq: Two times a day (BID) | ORAL | 0 refills | Status: DC

## 2021-06-18 MED ORDER — ALBUTEROL SULFATE (2.5 MG/3ML) 0.083% IN NEBU
2.5000 mg | INHALATION_SOLUTION | Freq: Four times a day (QID) | RESPIRATORY_TRACT | Status: DC
Start: 2021-06-18 — End: 2021-06-18

## 2021-06-18 MED ORDER — METHYLPREDNISOLONE ACETATE 80 MG/ML IJ SUSP
120.0000 mg | Freq: Once | INTRAMUSCULAR | Status: AC
  Administered 2021-06-18: 10:00:00 120 mg via INTRAMUSCULAR

## 2021-06-18 NOTE — Patient Instructions (Addendum)
Begin Omnicef 300mg  Twice daily for 1 week , take with food.  Prednisone taper over next week. (Steroids can increase blood sugars, keep on low sugar diet)  Continue on Spiriva daily  Albuterol inhaler As needed   Begin Delsym 2 tsp Twice daily  for cough As needed   Begin Tessalon Three times a day   Phenergan cough syrup as needed -may make you sleepy   Continue on Zyrtec 10mg  in am  Add Pepcid 20mg  At bedtime   Add Chlortab 4mg  At bedtime  As needed  drainage . (Chlorpheniramine 4mg  ) will cause sleepiness.  PFT next week .  Use sips of water to soothe throat, avoid throat clearing and coughing  Follow up next week as planned and As needed   Please contact office for sooner follow up if symptoms do not improve or worsen or seek emergency care

## 2021-06-18 NOTE — Progress Notes (Signed)
@Patient  ID: Breanna Ross, female    DOB: [birth year], 70 y.o.   MRN: [id number]  Chief Complaint  Patient presents with   Follow-up    Referring provider: Willey Blade, MD  HPI: 70 year old female former smoker seen for pulmonary consult May 09, 2021 for shortness of breath and cough for 6 months  Medical history significant for sleep apnea (CPAP intolerant), asthma COVID-19 infection February 2022   TEST/EVENTS :   cardiology for her shortness of breath, visit from 01/25/2020 reviewed.  She had normal myocardial perfusion scan and echocardiogram showed normal left ventricular function and normal diastolic pattern.  Echo 12/30/19: Normal LV 55-60%. Moderate LV hypertropy. Normal diastolic pattern. Mild Tricuspid regurgitation.   06/18/2021 Follow up : Cough and Dyspnea  Patient returns for a follow-up visit.  Patient was seen for pulmonary consult last month for cough and shortness of breath for 6 months.  She was felt to have a possible component of COPD as she has a smoking history.  She was started on Spiriva and was set up for PFTs. Complains that she has been worse for last last 4 weeks , seen by PCP initially was prescribed Amoxicillin and Steroid pack. Along with tessalon and Phenergan DM cough syrup. Chest xray on 06/12/21 showed clear lungs. Does not feel she is getting any better,  cough and wheezing getting worse. Unable to sleep due to severe cough . Coughing up thick tan mucus.  Appetite is good w/ no n/v/d. Taking Zyrtec daily .    Allergies  Allergen Reactions   Dilaudid [Hydromorphone Hcl] Other (See Comments)    Hallucinations    Penicillins Swelling    SWELLING REACTION UNSPECIFIED   Has patient had a PCN reaction causing immediate rash, facial/tongue/throat swelling, SOB or lightheadedness with hypotension:   # # YES # #  Has patient had a PCN reaction causing severe rash involving mucus membranes or skin necrosis: No Has patient had a PCN reaction  that required hospitalization No Has patient had a PCN reaction occurring within the last 10 years:No If all of the above answers are "NO", then may proceed with Cephalosporin use.    Oxycodone Nausea Only    Immunization History  Administered Date(s) Administered   Fluad Quad(high Dose 65+) 04/25/2021   Influenza,inj,Quad PF,6+ Mos 04/12/2014   PFIZER Comirnaty(Gray Top)Covid-19 Tri-Sucrose Vaccine 05/20/2020   PFIZER(Purple Top)SARS-COV-2 Vaccination 08/27/2019, 09/17/2019, 05/20/2020   Pneumococcal Conjugate-13 03/01/2014   Pneumococcal Polysaccharide-23 05/01/2020   Pneumococcal-Unspecified 03/01/2014   Tdap 12/31/2012    Past Medical History:  Diagnosis Date   Arthritis    BIlateral Knees (06/10/2016)   Asthma    GERD (gastroesophageal reflux disease)    Hypercholesteremia    Hypertension    Primary localized osteoarthritis of right knee    Shortness of breath    with exertion   Sleep apnea    unable to use cpap (06/10/2016)   Type 2 diabetes mellitus ([place])     Tobacco History: Social History   Tobacco Use  Smoking Status Former   Packs/day: 1.00   Years: 37.00   Pack years: 37.00   Types: Cigarettes   Quit date: 06/08/2006   Years since quitting: 15.0  Smokeless Tobacco Never   Counseling given: Not Answered   Outpatient Medications Prior to Visit  Medication Sig Dispense Refill   acetaminophen (TYLENOL) 500 MG tablet Take 1,000 mg by mouth 2 (two) times daily as needed.      albuterol (PROVENTIL HFA;VENTOLIN HFA) 108 (90  BASE) MCG/ACT inhaler Inhale 2 puffs into the lungs every 6 (six) hours as needed for wheezing or shortness of breath.      amLODipine (NORVASC) 10 MG tablet Take 10 mg by mouth daily.      Ascorbic Acid (VITAMIN C) 1000 MG tablet Take 1,000 mg by mouth daily.     aspirin EC 81 MG tablet Take 81 mg by mouth daily.     Calcium Carb-Cholecalciferol (CALCIUM + D3 PO) Take 1 tablet by mouth 2 (two) times daily.     cetirizine (ZYRTEC) 10  MG tablet Take 10 mg by mouth daily as needed for allergies.      Cholecalciferol (VITAMIN D3) 5000 units CAPS Take 5,000 Units by mouth daily.     Clobetasol Prop Emollient Base 0.05 % emollient cream Apply 1 application topically 2 (two) times daily.     Coenzyme Q10 (CO Q 10 PO) Take 1 tablet by mouth daily.     colesevelam (WELCHOL) 625 MG tablet Take 1,875 mg by mouth 2 (two) times daily with a meal.      cyanocobalamin 2000 MCG tablet Take 2,000 mcg by mouth daily.     diclofenac sodium (VOLTAREN) 1 % GEL Apply 2 g topically 3 (three) times daily as needed (pain).     esomeprazole (NEXIUM) 40 MG capsule Take 40 mg by mouth daily before breakfast.     eszopiclone (LUNESTA) 2 MG TABS tablet eszopiclone 2 mg tablet     fluticasone (FLONASE) 50 MCG/ACT nasal spray Place 2 sprays into both nostrils daily. (Patient taking differently: Place 1 spray into both nostrils 2 (two) times daily as needed for rhinitis.) 16 g 6   latanoprost (XALATAN) 0.005 % ophthalmic solution 1 drop at bedtime.     Magnesium 400 MG CAPS Take 400 mg by mouth every evening.     metFORMIN (GLUCOPHAGE) 500 MG tablet Take 500 mg by mouth 2 (two) times daily with a meal.      mometasone-formoterol (DULERA) 100-5 MCG/ACT AERO Inhale 2 puffs into the lungs 2 (two) times daily.     Multiple Vitamins-Minerals (MULTIVITAMIN WITH MINERALS) tablet Take 1 tablet by mouth daily. Alive     omega-3 acid ethyl esters (LOVAZA) 1 g capsule Take by mouth 2 (two) times daily.     spironolactone (ALDACTONE) 25 MG tablet Take 0.5 tablets (12.5 mg total) by mouth in the morning. 45 tablet 1   Tiotropium Bromide Monohydrate (SPIRIVA RESPIMAT) 2.5 MCG/ACT AERS Inhale 2 puffs into the lungs daily. 4 g 0   Tiotropium Bromide Monohydrate (SPIRIVA RESPIMAT) 2.5 MCG/ACT AERS Inhale 2 puffs into the lungs daily. 4 g 11   valsartan-hydrochlorothiazide (DIOVAN-HCT) 320-25 MG per tablet Take 1 tablet by mouth daily.     vitamin E 180 MG (400 UNITS)  capsule vitamin E (dl, acetate) 263 mg (785 unit) capsule  Take 1 capsule every day by oral route.     benzonatate (TESSALON) 100 MG capsule Take 1 capsule by mouth every 8 (eight) hours for cough. 21 capsule 0   amLODipine (NORVASC) 5 MG tablet Norvasc 5 mg tablet  Take 1 tablet every day by oral route. (Patient not taking: Reported on 06/18/2021)     predniSONE (DELTASONE) 20 MG tablet Take 2 tablets (40 mg total) by mouth daily. (Patient not taking: Reported on 06/18/2021) 10 tablet 0   SYMBICORT 160-4.5 MCG/ACT inhaler SMARTSIG:2 Puff(s) By Mouth Twice Daily (Patient not taking: Reported on 06/18/2021)     No facility-administered medications prior  to visit.     Review of Systems:   Constitutional:   No  weight loss, night sweats,  Fevers, chills, + fatigue, or  lassitude.  HEENT:   No headaches,  Difficulty swallowing,  Tooth/dental problems, or  Sore throat,                No sneezing, itching, ear ache,  +nasal congestion, post nasal drip,   CV:  No chest pain,  Orthopnea, PND, swelling in lower extremities, anasarca, dizziness, palpitations, syncope.   GI  No heartburn, indigestion, abdominal pain, nausea, vomiting, diarrhea, change in bowel habits, loss of appetite, bloody stools.   Resp: .  No chest wall deformity  Skin: no rash or lesions.  GU: no dysuria, change in color of urine, no urgency or frequency.  No flank pain, no hematuria   MS:  No joint pain or swelling.  No decreased range of motion.  No back pain.    Physical Exam  BP 130/90 (BP Location: Left Arm, Patient Position: Sitting, Cuff Size: Large)   Pulse (!) 104   Temp 98.4 F (36.9 C) (Oral)   Ht 5\' 7"  (1.702 m)   Wt 293 lb 3.2 oz (133 kg)   SpO2 92%   BMI 45.92 kg/m   GEN: A/Ox3; pleasant , NAD, well nourished    HEENT:  Iron/AT,  NOSE-clear, THROAT-clear, no lesions, no postnasal drip or exudate noted.   NECK:  Supple w/ fair ROM; no JVD; normal carotid impulses w/o bruits; no thyromegaly or  nodules palpated; no lymphadenopathy.    RESP  scattered rhonchi  no accessory muscle use, no dullness to percussion  CARD:  RRR, no m/r/g, tr  peripheral edema, pulses intact, no cyanosis or clubbing.  GI:   Soft & nt; nml bowel sounds; no organomegaly or masses detected.   Musco: Warm bil, no deformities or joint swelling noted.   Neuro: alert, no focal deficits noted.    Skin: Warm, no lesions or rashes    Lab Results:   BNP   ProBNP No results found for: PROBNP  Imaging: DG Chest 2 View  Result Date: 06/12/2021 CLINICAL DATA:  Cough for 2 weeks with shortness of breath EXAM: CHEST - 2 VIEW COMPARISON:  10/30/2020 FINDINGS: The heart size and mediastinal contours are within normal limits. Both lungs are clear. The visualized skeletal structures are unremarkable. IMPRESSION: No active cardiopulmonary disease. Electronically Signed   By: Inez Catalina M.D.   On: 06/12/2021 22:50    albuterol (PROVENTIL) (2.5 MG/3ML) 0.083% nebulizer solution 2.5 mg     Date Action Dose Route User   06/18/2021 0950 Given 2.5 mg Nebulization (Other) Nolon Stalls, Kimber Relic, RN      methylPREDNISolone acetate (DEPO-MEDROL) injection 120 mg     Date Action Dose Route User   06/18/2021 0945 Given 120 mg Intramuscular (Left Ventrogluteal) Nolon Stalls, Kimber Relic, RN       No flowsheet data found.  No results found for: NITRICOXIDE      Assessment & Plan:   Chronic bronchitis (HCC) Slow to resolve flare , chest xray is clear last week.  Will treat with additional empiric abx and steroids  Patient education on steroids  Chronic cough management regimen   Depo medrol 120mg  IM x 1  Albuterol Neb x 1 .  Plan  Patient Instructions  Begin Omnicef 300mg  Twice daily for 1 week , take with food.  Prednisone taper over next week. (Steroids can increase blood sugars, keep  on low sugar diet)  Continue on Spiriva daily  Albuterol inhaler As needed   Begin Delsym 2 tsp Twice daily   for cough As needed   Begin Tessalon Three times a day   Phenergan cough syrup as needed -may make you sleepy   Continue on Zyrtec 10mg  in am  Add Pepcid 20mg  At bedtime   Add Chlortab 4mg  At bedtime  As needed  drainage . (Chlorpheniramine 4mg  ) will cause sleepiness.  PFT next week .  Use sips of water to soothe throat, avoid throat clearing and coughing  Follow up next week as planned and As needed   Please contact office for sooner follow up if symptoms do not improve or worsen or seek emergency care          I spent   32 minutes dedicated to the care of this patient on the date of this encounter to include pre-visit review of records, face-to-face time with the patient discussing conditions above, post visit ordering of testing, clinical documentation with the electronic health record, making appropriate referrals as documented, and communicating necessary findings to members of the patients care team.    Rexene Edison, NP 06/18/2021

## 2021-06-18 NOTE — Assessment & Plan Note (Addendum)
Slow to resolve flare , chest xray is clear last week.  Will treat with additional empiric abx and steroids  Patient education on steroids  Chronic cough management regimen   Depo medrol 120mg  IM x 1  Albuterol Neb x 1 .  Plan  Patient Instructions  Begin Omnicef 300mg  Twice daily for 1 week , take with food.  Prednisone taper over next week. (Steroids can increase blood sugars, keep on low sugar diet)  Continue on Spiriva daily  Albuterol inhaler As needed   Begin Delsym 2 tsp Twice daily  for cough As needed   Begin Tessalon Three times a day   Phenergan cough syrup as needed -may make you sleepy   Continue on Zyrtec 10mg  in am  Add Pepcid 20mg  At bedtime   Add Chlortab 4mg  At bedtime  As needed  drainage . (Chlorpheniramine 4mg  ) will cause sleepiness.  PFT next week .  Use sips of water to soothe throat, avoid throat clearing and coughing  Follow up next week as planned and As needed   Please contact office for sooner follow up if symptoms do not improve or worsen or seek emergency care

## 2021-06-25 ENCOUNTER — Ambulatory Visit (INDEPENDENT_AMBULATORY_CARE_PROVIDER_SITE_OTHER): Payer: Medicare Other | Admitting: Pulmonary Disease

## 2021-06-25 ENCOUNTER — Other Ambulatory Visit: Payer: Self-pay

## 2021-06-25 ENCOUNTER — Ambulatory Visit: Payer: Medicare Other | Admitting: Pulmonary Disease

## 2021-06-25 ENCOUNTER — Encounter: Payer: Self-pay | Admitting: Pulmonary Disease

## 2021-06-25 VITALS — BP 136/84 | HR 85 | Temp 97.7°F | Ht 66.0 in | Wt 286.0 lb

## 2021-06-25 DIAGNOSIS — J42 Unspecified chronic bronchitis: Secondary | ICD-10-CM

## 2021-06-25 DIAGNOSIS — R0602 Shortness of breath: Secondary | ICD-10-CM

## 2021-06-25 LAB — PULMONARY FUNCTION TEST
DL/VA % pred: 136 %
DL/VA: 5.56 ml/min/mmHg/L
DLCO cor % pred: 99 %
DLCO cor: 20.73 ml/min/mmHg
DLCO unc % pred: 99 %
DLCO unc: 20.86 ml/min/mmHg
FEF 25-75 Post: 1.38 L/sec
FEF 25-75 Pre: 0.91 L/sec
FEF2575-%Change-Post: 51 %
FEF2575-%Pred-Post: 73 %
FEF2575-%Pred-Pre: 48 %
FEV1-%Change-Post: 11 %
FEV1-%Pred-Post: 76 %
FEV1-%Pred-Pre: 69 %
FEV1-Post: 1.57 L
FEV1-Pre: 1.41 L
FEV1FVC-%Change-Post: 4 %
FEV1FVC-%Pred-Pre: 91 %
FEV6-%Change-Post: 7 %
FEV6-%Pred-Post: 83 %
FEV6-%Pred-Pre: 77 %
FEV6-Post: 2.13 L
FEV6-Pre: 1.97 L
FEV6FVC-%Pred-Post: 103 %
FEV6FVC-%Pred-Pre: 103 %
FVC-%Change-Post: 7 %
FVC-%Pred-Post: 80 %
FVC-%Pred-Pre: 75 %
FVC-Post: 2.13 L
FVC-Pre: 1.98 L
Post FEV1/FVC ratio: 74 %
Post FEV6/FVC ratio: 100 %
Pre FEV1/FVC ratio: 71 %
Pre FEV6/FVC Ratio: 100 %
RV % pred: 92 %
RV: 2.09 L
TLC % pred: 80 %
TLC: 4.32 L

## 2021-06-25 MED ORDER — TRELEGY ELLIPTA 100-62.5-25 MCG/ACT IN AEPB: 1.0000 | INHALATION_SPRAY | Freq: Every day | RESPIRATORY_TRACT | 6 refills | Status: DC

## 2021-06-25 MED ORDER — TRELEGY ELLIPTA 100-62.5-25 MCG/ACT IN AEPB: 1.0000 | INHALATION_SPRAY | Freq: Every day | RESPIRATORY_TRACT | 0 refills | Status: DC

## 2021-06-25 NOTE — Patient Instructions (Addendum)
Start trelegy ellipta 1 puff daily - rinse mouth out after each use  Stop spiriva inhaler  Continue to use albuterol as needed  Continue to take pepcid and elevate the head of your bed at night to reduce reflux symptoms  Continue to take zyrtec and chlortab for allergies.   Follow up in 4 weeks to consider lab draws and monitor new inhaler

## 2021-06-25 NOTE — Progress Notes (Signed)
PFT done today. 

## 2021-06-25 NOTE — Progress Notes (Signed)
Synopsis: Referred in October 2022 for shortness of breath by [name], MD  Subjective:   PATIENT ID: Breanna Ross GENDER: female DOB: 09/28/1950, MRN: [id number]   HPI  Chief Complaint  Patient presents with   Follow-up    Breathing as improved since the last visit. She has finished round of omnicef and prednisone. She still has occ cough and wheezing. Cough is prod with clear sputum. She is using her albuterol inhaler every 4 hours while awake.    Breanna Ross is a 70 year old woman, former smoker with history of sleep apnea, DMII, GERD and asthma who returns to pulmonary clinic for bronchitis.  She has been doing better since last visit. She was placed on a steroid taper after seeing Rubye Oaks, NP on 06/18/21 which has helped significantly but reports being at 50% of her baseline. She continues to cough, producing clear mucous. She is using spiriva inhaler daily and as needed albuterol.   She is taking pepcid for GERD and sleeping with the head of her bed elevated. She is taking zyrtec and chlortab for allergies.   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 Normal diffusion capacity.   OV 05/09/2021 She reports having shortness of breath since February 2022 she was sick with COVID.  Her shortness of breath is worse with walking and exercise.  Along with the shortness of breath she feels that there is a clog in her chest.  She has been using throat lozenges and cough syrup without much relief of that clogged sensation in her chest.  She denies productive cough.  She denies wheezing.  She was treated with a prednisone taper which alleviated her symptoms.  She denies any sinus congestion or drainage.  She does have reflux symptoms.  She has been evaluated by cardiology for her shortness of breath, visit from 01/25/2020 reviewed.  She had normal myocardial perfusion scan and echocardiogram showed normal left ventricular function and normal diastolic  pattern.  She was diagnosed with sleep apnea in 2017 and reports she was unable to tolerate CPAP therapy.  She is a former smoker and has a 38-pack-year smoking history.  She quit in 2007.  Past Medical History:  Diagnosis Date   Arthritis    BIlateral Knees (06/10/2016)   Asthma    GERD (gastroesophageal reflux disease)    Hypercholesteremia    Hypertension    Primary localized osteoarthritis of right knee    Shortness of breath    with exertion   Sleep apnea    unable to use cpap (06/10/2016)   Type 2 diabetes mellitus (HCC)      Family History  Problem Relation Age of Onset   Hypertension Mother    Stroke Mother    Hypertension Father    Heart attack Father    Hypertension Sister    Hypertension Sister    Hypertension Sister    Hypertension Sister    Hypertension Sister    Diabetes Sister    Stroke Brother    Diabetes Brother    Diabetes Brother    Diabetes Brother      Social History   Socioeconomic History   Marital status: Widowed    Spouse name: Not on file   Number of children: 3   Years of education: Not on file   Highest education level: Not on file  Occupational History   Not on file  Tobacco Use   Smoking status: Former    Packs/day: 1.00  Years: 37.00    Pack years: 37.00    Types: Cigarettes    Quit date: 06/08/2006    Years since quitting: 15.0   Smokeless tobacco: Never  Vaping Use   Vaping Use: Never used  Substance and Sexual Activity   Alcohol use: Yes    Comment: 06/10/2016 "might have a drink once/year"   Drug use: No   Sexual activity: Not Currently  Other Topics Concern   Not on file  Social History Narrative   Not on file   Social Determinants of Health   Financial Resource Strain: Not on file  Food Insecurity: Not on file  Transportation Needs: Not on file  Physical Activity: Not on file  Stress: Not on file  Social Connections: Not on file  Intimate Partner Violence: Not on file     Allergies  Allergen  Reactions   Dilaudid [Hydromorphone Hcl] Other (See Comments)    Hallucinations    Penicillins Swelling    SWELLING REACTION UNSPECIFIED   Has patient had a PCN reaction causing immediate rash, facial/tongue/throat swelling, SOB or lightheadedness with hypotension:   # # YES # #  Has patient had a PCN reaction causing severe rash involving mucus membranes or skin necrosis: No Has patient had a PCN reaction that required hospitalization No Has patient had a PCN reaction occurring within the last 10 years:No If all of the above answers are "NO", then may proceed with Cephalosporin use.    Oxycodone Nausea Only     Outpatient Medications Prior to Visit  Medication Sig Dispense Refill   acetaminophen (TYLENOL) 500 MG tablet Take 1,000 mg by mouth 2 (two) times daily as needed.      albuterol (PROVENTIL HFA;VENTOLIN HFA) 108 (90 BASE) MCG/ACT inhaler Inhale 2 puffs into the lungs every 6 (six) hours as needed for wheezing or shortness of breath.      amLODipine (NORVASC) 10 MG tablet Take 10 mg by mouth daily.      Ascorbic Acid (VITAMIN C) 1000 MG tablet Take 1,000 mg by mouth daily.     aspirin EC 81 MG tablet Take 81 mg by mouth daily.     benzonatate (TESSALON) 200 MG capsule Take 1 capsule (200 mg total) by mouth 3 (three) times daily as needed for cough. 45 capsule 1   Calcium Carb-Cholecalciferol (CALCIUM + D3 PO) Take 1 tablet by mouth 2 (two) times daily.     cetirizine (ZYRTEC) 10 MG tablet Take 10 mg by mouth daily as needed for allergies.      chlorpheniramine (CHLOR-TRIMETON) 4 MG tablet Take 4 mg by mouth 2 (two) times daily as needed for allergies.     Cholecalciferol (VITAMIN D3) 5000 units CAPS Take 5,000 Units by mouth daily.     Clobetasol Prop Emollient Base 0.05 % emollient cream Apply 1 application topically 2 (two) times daily.     Coenzyme Q10 (CO Q 10 PO) Take 1 tablet by mouth daily.     colesevelam (WELCHOL) 625 MG tablet Take 1,875 mg by mouth 2 (two) times daily  with a meal.      cyanocobalamin 2000 MCG tablet Take 2,000 mcg by mouth daily.     diclofenac sodium (VOLTAREN) 1 % GEL Apply 2 g topically 3 (three) times daily as needed (pain).     esomeprazole (NEXIUM) 40 MG capsule Take 40 mg by mouth daily before breakfast.     eszopiclone (LUNESTA) 2 MG TABS tablet eszopiclone 2 mg tablet  fluticasone (FLONASE) 50 MCG/ACT nasal spray Place 2 sprays into both nostrils daily. (Patient taking differently: Place 1 spray into both nostrils 2 (two) times daily as needed for rhinitis.) 16 g 6   latanoprost (XALATAN) 0.005 % ophthalmic solution 1 drop at bedtime.     Magnesium 400 MG CAPS Take 400 mg by mouth every evening.     metFORMIN (GLUCOPHAGE) 500 MG tablet Take 500 mg by mouth 2 (two) times daily with a meal.      Multiple Vitamins-Minerals (MULTIVITAMIN WITH MINERALS) tablet Take 1 tablet by mouth daily. Alive     omega-3 acid ethyl esters (LOVAZA) 1 g capsule Take by mouth 2 (two) times daily.     spironolactone (ALDACTONE) 25 MG tablet Take 0.5 tablets (12.5 mg total) by mouth in the morning. 45 tablet 1   valsartan-hydrochlorothiazide (DIOVAN-HCT) 320-25 MG per tablet Take 1 tablet by mouth daily.     vitamin E 180 MG (400 UNITS) capsule vitamin E (dl, acetate) 161 mg (096 unit) capsule  Take 1 capsule every day by oral route.     Tiotropium Bromide Monohydrate (SPIRIVA RESPIMAT) 2.5 MCG/ACT AERS Inhale 2 puffs into the lungs daily. 4 g 11   cefdinir (OMNICEF) 300 MG capsule Take 1 capsule (300 mg total) by mouth 2 (two) times daily. 14 capsule 0   mometasone-formoterol (DULERA) 100-5 MCG/ACT AERO Inhale 2 puffs into the lungs 2 (two) times daily.     predniSONE (DELTASONE) 10 MG tablet 4 tabs for 2 days, then 3 tabs for 2 days, 2 tabs for 2 days, then 1 tab for 2 days, then stop 20 tablet 0   Tiotropium Bromide Monohydrate (SPIRIVA RESPIMAT) 2.5 MCG/ACT AERS Inhale 2 puffs into the lungs daily. 4 g 0   No facility-administered medications prior  to visit.   Review of Systems  Constitutional:  Negative for chills, fever, malaise/fatigue and weight loss.  HENT:  Negative for congestion, sinus pain and sore throat.   Eyes: Negative.   Respiratory:  Positive for cough, shortness of breath and wheezing. Negative for hemoptysis and sputum production.   Cardiovascular:  Negative for chest pain, palpitations, orthopnea, claudication and leg swelling.  Gastrointestinal:  Negative for abdominal pain, heartburn, nausea and vomiting.  Genitourinary: Negative.   Musculoskeletal:  Negative for joint pain and myalgias.  Skin:  Negative for rash.  Neurological:  Negative for weakness.  Endo/Heme/Allergies: Negative.   Psychiatric/Behavioral: Negative.     Objective:   Vitals:   06/25/21 1609  BP: 136/84  Pulse: 85  Temp: 97.7 F (36.5 C)  TempSrc: Oral  SpO2: 97%  Weight: 286 lb (129.7 kg)  Height:  (1.676 m)   Physical Exam Constitutional:      General: She is not in acute distress.    Appearance: She is obese. She is not ill-appearing.  HENT:     Head: Normocephalic and atraumatic.  Eyes:     General: No scleral icterus.    Conjunctiva/sclera: Conjunctivae normal.     Pupils: Pupils are equal, round, and reactive to light.  Cardiovascular:     Rate and Rhythm: Normal rate and regular rhythm.     Pulses: Normal pulses.     Heart sounds: Normal heart sounds. No murmur heard. Pulmonary:     Effort: Pulmonary effort is normal.     Breath sounds: Normal breath sounds. No wheezing, rhonchi or rales.  Abdominal:     General: Bowel sounds are normal.     Palpations: Abdomen is  soft.  Musculoskeletal:     Right lower leg: No edema.     Left lower leg: No edema.  Lymphadenopathy:     Cervical: No cervical adenopathy.  Skin:    General: Skin is warm and dry.  Neurological:     General: No focal deficit present.     Mental Status: She is alert.  Psychiatric:        Mood and Affect: Mood normal.        Behavior:  Behavior normal.        Thought Content: Thought content normal.        Judgment: Judgment normal.   CBC    Component Value Date/Time   WBC 9.4 06/12/2016 0652   RBC 3.81 (L) 06/12/2016 0652   HGB 10.3 (L) 06/12/2016 0652   HCT 32.8 (L) 06/12/2016 0652   PLT 233 06/12/2016 0652   MCV 86.1 06/12/2016 0652   MCH 27.0 06/12/2016 0652   MCHC 31.4 06/12/2016 0652   RDW 14.4 06/12/2016 0652   LYMPHSABS 3.3 05/31/2016 1441   MONOABS 0.6 05/31/2016 1441   EOSABS 0.2 05/31/2016 1441   BASOSABS 0.0 05/31/2016 1441   BMP Latest Ref Rng & Units 01/03/2020 12/27/2019 06/11/2016  Glucose 65 - 99 mg/dL 960(A) 540(J) 811(B)  BUN 8 - 27 mg/dL Creatinine 0.57 - 1.00 mg/dL 1.47 8.29 5.62  BUN/Creat Ratio 12 - -  Sodium 134 - 144 mmol/L 137 141 136  Potassium 3.5 - 5.2 mmol/L 4.2 4.2 3.9  Chloride 96 - 106 mmol/L 97 101 100(L)  CO2 20 - 29 mmol/L Calcium 8.7 - 10.3 mg/dL 9.7 13.0 8.6(V)   Chest imaging: CXR 10/30/20 Cardiomediastinal silhouette unchanged in size and contour. No pneumothorax. No pleural effusion. Coarsened interstitial markings similar to the prior. No pneumothorax pleural effusion or confluent airspace disease. No displaced fracture. Degenerative changes of the Spine  PFT: PFT Results Latest Ref Rng & Units 06/25/2021  FVC-Pre L 1.98  FVC-Predicted Pre % 75  FVC-Post L 2.13  FVC-Predicted Post % 80  Pre FEV1/FVC % % 71  Post FEV1/FCV % % 74  FEV1-Pre L 1.41  FEV1-Predicted Pre % 69  FEV1-Post L 1.57  DLCO uncorrected ml/min/mmHg 20.86  DLCO UNC% % 99  DLCO corrected ml/min/mmHg 20.73  DLCO COR %Predicted % 99  DLVA Predicted % 136  TLC L 4.32  TLC % Predicted % 80  RV % Predicted % 92   Labs:  Path:  Echo 12/30/19: Normal LV 55-60%. Moderate LV hypertropy. Normal diastolic pattern. Mild Tricuspid regurgitation.   Heart Catheterization:  Assessment & Plan:   Chronic bronchitis, unspecified chronic bronchitis type (HCC) - Plan:  CT Chest High Resolution, Fluticasone-Umeclidin-Vilant (TRELEGY ELLIPTA) 100-62.5-25 MCG/ACT AEPB  Discussion: Lucilla Petrenko is a 70 year old woman, former smoker with history of sleep apnea, DMII, GERD and asthma who returns to pulmonary clinic for bronchitis.   She continues to experience cough, sputum production and intermittent wheezing. We will step up her inhaler therapy to Trelegy ellipta 1 puff daily along with as needed albuterol.   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   We will check CBC and IgE levels in 1 month since she recently completed prednisone taper.   She is to continue pepcid for GERD and zyrtec/chlortab for allergies.   Follow up in 1 month.   Melody Comas, MD [facility] Office: [phone]   Current Outpatient Medications:    acetaminophen (TYLENOL) 500 MG tablet, Take 1,000 mg by mouth 2 (two) times daily as needed. , Disp: , Rfl:    albuterol (PROVENTIL HFA;VENTOLIN HFA) 108 (90 BASE) MCG/ACT inhaler, Inhale 2 puffs into the lungs every 6 (six) hours as needed for wheezing or shortness of breath. , Disp: , Rfl:    amLODipine (NORVASC) 10 MG tablet, Take 10 mg by mouth daily. , Disp: , Rfl:    Ascorbic Acid (VITAMIN C) 1000 MG tablet, Take 1,000 mg by mouth daily., Disp: , Rfl:    aspirin EC 81 MG tablet, Take 81 mg by mouth daily., Disp: , Rfl:    benzonatate (TESSALON) 200 MG capsule, Take 1 capsule (200 mg total) by mouth 3 (three) times daily as needed for cough., Disp: 45 capsule, Rfl: 1   Calcium Carb-Cholecalciferol (CALCIUM + D3 PO), Take 1 tablet by mouth 2 (two) times daily., Disp: , Rfl:    cetirizine (ZYRTEC) 10 MG tablet, Take 10 mg by mouth daily as needed for allergies. , Disp: , Rfl:    chlorpheniramine (CHLOR-TRIMETON) 4 MG tablet, Take 4 mg by mouth 2 (two) times daily as needed for allergies., Disp: , Rfl:    Cholecalciferol  (VITAMIN D3) 5000 units CAPS, Take 5,000 Units by mouth daily., Disp: , Rfl:    Clobetasol Prop Emollient Base 0.05 % emollient cream, Apply 1 application topically 2 (two) times daily., Disp: , Rfl:    Coenzyme Q10 (CO Q 10 PO), Take 1 tablet by mouth daily., Disp: , Rfl:    colesevelam (WELCHOL) 625 MG tablet, Take 1,875 mg by mouth 2 (two) times daily with a meal. , Disp: , Rfl:    cyanocobalamin 2000 MCG tablet, Take 2,000 mcg by mouth daily., Disp: , Rfl:    diclofenac sodium (VOLTAREN) 1 % GEL, Apply 2 g topically 3 (three) times daily as needed (pain)., Disp: , Rfl:    esomeprazole (NEXIUM) 40 MG capsule, Take 40 mg by mouth daily before breakfast., Disp: , Rfl:    eszopiclone (LUNESTA) 2 MG TABS tablet, eszopiclone 2 mg tablet, Disp: , Rfl:    fluticasone (FLONASE) 50 MCG/ACT nasal spray, Place 2 sprays into both nostrils daily. (Patient taking differently: Place 1 spray into both nostrils 2 (two) times daily as needed for rhinitis.), Disp: 16 g, Rfl: 6   Fluticasone-Umeclidin-Vilant (TRELEGY ELLIPTA) 100-62.5-25 MCG/ACT AEPB, Inhale 1 puff into the lungs daily., Disp: 28 each, Rfl: 6   Fluticasone-Umeclidin-Vilant (TRELEGY ELLIPTA) 100-62.5-25 MCG/ACT AEPB, Inhale 1 puff into the lungs daily., Disp: 14 each, Rfl: 0   latanoprost (XALATAN) 0.005 % ophthalmic solution, 1 drop at bedtime., Disp: , Rfl:    Magnesium 400 MG CAPS, Take 400 mg by mouth every evening., Disp: , Rfl:    metFORMIN (GLUCOPHAGE) 500 MG tablet, Take 500 mg by mouth 2 (two) times daily with a meal. , Disp: , Rfl:    Multiple Vitamins-Minerals (MULTIVITAMIN WITH MINERALS) tablet, Take 1 tablet by mouth daily. Alive, Disp: , Rfl:    omega-3 acid ethyl esters (LOVAZA) 1 g capsule, Take by mouth 2 (two) times daily., Disp: , Rfl:    spironolactone (ALDACTONE) 25 MG tablet, Take 0.5 tablets (12.5 mg total) by mouth in the morning., Disp: 45 tablet, Rfl: 1   valsartan-hydrochlorothiazide (DIOVAN-HCT) 320-25 MG per tablet, Take  1 tablet by mouth daily., Disp: , Rfl:    vitamin E 180 MG (400 UNITS) capsule, vitamin E (  dl, acetate) 161 mg (096 unit) capsule  Take 1 capsule every day by oral route., Disp: , Rfl:

## 2021-07-09 ENCOUNTER — Other Ambulatory Visit: Payer: Self-pay

## 2021-07-09 ENCOUNTER — Ambulatory Visit (INDEPENDENT_AMBULATORY_CARE_PROVIDER_SITE_OTHER)
Admission: RE | Admit: 2021-07-09 | Discharge: 2021-07-09 | Disposition: A | Discharge status: Home / Self Care | Payer: Medicare Other | Source: Ambulatory Visit | Attending: Pulmonary Disease | Admitting: Pulmonary Disease

## 2021-07-09 DIAGNOSIS — I700 Atherosclerosis of aorta: Secondary | ICD-10-CM | POA: Diagnosis not present

## 2021-07-09 DIAGNOSIS — J42 Unspecified chronic bronchitis: Secondary | ICD-10-CM

## 2021-07-09 DIAGNOSIS — J439 Emphysema, unspecified: Secondary | ICD-10-CM | POA: Diagnosis not present

## 2021-07-27 ENCOUNTER — Other Ambulatory Visit: Payer: Self-pay

## 2021-07-27 ENCOUNTER — Encounter: Payer: Self-pay | Admitting: Pulmonary Disease

## 2021-07-27 ENCOUNTER — Ambulatory Visit (INDEPENDENT_AMBULATORY_CARE_PROVIDER_SITE_OTHER): Payer: Medicare Other | Admitting: Pulmonary Disease

## 2021-07-27 VITALS — BP 128/84 | HR 90 | Ht 66.0 in | Wt 279.4 lb

## 2021-07-27 DIAGNOSIS — J432 Centrilobular emphysema: Secondary | ICD-10-CM

## 2021-07-27 MED ORDER — BREZTRI AEROSPHERE 160-9-4.8 MCG/ACT IN AERO: 2.0000 | INHALATION_SPRAY | Freq: Two times a day (BID) | RESPIRATORY_TRACT | 6 refills | Status: DC

## 2021-07-27 NOTE — Patient Instructions (Addendum)
Stop Trelegy Ellipta   Start Breztri Ellipta 2 puffs twice daily - use with spacer  - rinse mouth out after each use  Continue to use albuterol as needed   Continue to take pepcid in the evening and nexium in the morning and elevate the head of your bed at night to reduce reflux symptoms   Continue to take zyrtec and chlortab for allergies.  We can consider nasal sprays in the future if your cough persists

## 2021-07-27 NOTE — Progress Notes (Signed)
Synopsis: Referred in October 2022 for shortness of breath by Breanna Devon, MD  Subjective:   PATIENT ID: Breanna Ross GENDER: female DOB: 03/27/1951, MRN: [id number]  HPI  Chief Complaint  Patient presents with   Follow-up    1 mo f/u after starting Trelegy. States the inhaler has helped but she has noticed she developed a dry cough.    Breanna Ross is a 71 year old woman, former smoker with history of sleep apnea, DMII, GERD and asthma who returns to pulmonary clinic for bronchitis.  She was started on trelegy ellipta 1 puff daily at last visit with improvement in her symptoms but she is reporting a dry cough now.   HRCT Chest 07/09/21 showed minimal centrilobular emphysematous changes and concern for indeterminate UIP pattern. She has some bronchiectasis of the right lower lobe, medial field with interstitial thickening.   OV 06/25/21 She has been doing better since last visit. She was placed on a steroid taper after seeing Breanna Oaks, NP on 06/18/21 which has helped significantly but reports being at 50% of her baseline. She continues to cough, producing clear mucous. She is using spiriva inhaler daily and as needed albuterol.   She is taking pepcid for GERD and sleeping with the head of her bed elevated. She is taking zyrtec and chlortab for allergies.   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 Normal diffusion capacity.   OV 05/09/2021 She reports having shortness of breath since February 2022 she was sick with COVID.  Her shortness of breath is worse with walking and exercise.  Along with the shortness of breath she feels that there is a clog in her chest.  She has been using throat lozenges and cough syrup without much relief of that clogged sensation in her chest.  She denies productive cough.  She denies wheezing.  She was treated with a prednisone taper which alleviated her symptoms.  She denies any sinus congestion or drainage.  She does  have reflux symptoms.  She has been evaluated by cardiology for her shortness of breath, visit from 01/25/2020 reviewed.  She had normal myocardial perfusion scan and echocardiogram showed normal left ventricular function and normal diastolic pattern.  She was diagnosed with sleep apnea in 2017 and reports she was unable to tolerate CPAP therapy.  She is a former smoker and has a 38-pack-year smoking history.  She quit in 2007.  Past Medical History:  Diagnosis Date   Arthritis    BIlateral Knees (06/10/2016)   Asthma    GERD (gastroesophageal reflux disease)    Hypercholesteremia    Hypertension    Primary localized osteoarthritis of right knee    Shortness of breath    with exertion   Sleep apnea    unable to use cpap (06/10/2016)   Type 2 diabetes mellitus (HCC)      Family History  Problem Relation Age of Onset   Hypertension Mother    Stroke Mother    Hypertension Father    Heart attack Father    Hypertension Sister    Hypertension Sister    Hypertension Sister    Hypertension Sister    Hypertension Sister    Diabetes Sister    Stroke Brother    Diabetes Brother    Diabetes Brother    Diabetes Brother      Social History   Socioeconomic History   Marital status: Widowed    Spouse name: Not on file   Number of children: 3  Years of education: Not on file   Highest education level: Not on file  Occupational History   Not on file  Tobacco Use   Smoking status: Former    Packs/day: 1.00    Years: 37.00    Pack years: 37.00    Types: Cigarettes    Quit date: 06/08/2006    Years since quitting: 15.1   Smokeless tobacco: Never  Vaping Use   Vaping Use: Never used  Substance and Sexual Activity   Alcohol use: Yes    Comment: 06/10/2016 "might have a drink once/year"   Drug use: No   Sexual activity: Not Currently  Other Topics Concern   Not on file  Social History Narrative   Not on file   Social Determinants of Health   Financial Resource  Strain: Not on file  Food Insecurity: Not on file  Transportation Needs: Not on file  Physical Activity: Not on file  Stress: Not on file  Social Connections: Not on file  Intimate Partner Violence: Not on file     Allergies  Allergen Reactions   Dilaudid [Hydromorphone Hcl] Other (See Comments)    Hallucinations    Penicillins Swelling    SWELLING REACTION UNSPECIFIED   Has patient had a PCN reaction causing immediate rash, facial/tongue/throat swelling, SOB or lightheadedness with hypotension:   # # YES # #  Has patient had a PCN reaction causing severe rash involving mucus membranes or skin necrosis: No Has patient had a PCN reaction that required hospitalization No Has patient had a PCN reaction occurring within the last 10 years:No If all of the above answers are "NO", then may proceed with Cephalosporin use.    Oxycodone Nausea Only     Outpatient Medications Prior to Visit  Medication Sig Dispense Refill   acetaminophen (TYLENOL) 500 MG tablet Take 1,000 mg by mouth 2 (two) times daily as needed.      albuterol (PROVENTIL HFA;VENTOLIN HFA) 108 (90 BASE) MCG/ACT inhaler Inhale 2 puffs into the lungs every 6 (six) hours as needed for wheezing or shortness of breath.      amLODipine (NORVASC) 10 MG tablet Take 10 mg by mouth daily.      Ascorbic Acid (VITAMIN C) 1000 MG tablet Take 1,000 mg by mouth daily.     aspirin EC 81 MG tablet Take 81 mg by mouth daily.     benzonatate (TESSALON) 200 MG capsule Take 1 capsule (200 mg total) by mouth 3 (three) times daily as needed for cough. 45 capsule 1   Calcium Carb-Cholecalciferol (CALCIUM + D3 PO) Take 1 tablet by mouth 2 (two) times daily.     cetirizine (ZYRTEC) 10 MG tablet Take 10 mg by mouth daily as needed for allergies.      chlorpheniramine (CHLOR-TRIMETON) 4 MG tablet Take 4 mg by mouth 2 (two) times daily as needed for allergies.     Cholecalciferol (VITAMIN D3) 5000 units CAPS Take 5,000 Units by mouth daily.      Clobetasol Prop Emollient Base 0.05 % emollient cream Apply 1 application topically 2 (two) times daily.     Coenzyme Q10 (CO Q 10 PO) Take 1 tablet by mouth daily.     colesevelam (WELCHOL) 625 MG tablet Take 1,875 mg by mouth 2 (two) times daily with a meal.      cyanocobalamin 2000 MCG tablet Take 2,000 mcg by mouth daily.     diclofenac sodium (VOLTAREN) 1 % GEL Apply 2 g topically 3 (three) times daily  as needed (pain).     esomeprazole (NEXIUM) 40 MG capsule Take 40 mg by mouth daily before breakfast.     eszopiclone (LUNESTA) 2 MG TABS tablet eszopiclone 2 mg tablet     fluticasone (FLONASE) 50 MCG/ACT nasal spray Place 2 sprays into both nostrils daily. (Patient taking differently: Place 1 spray into both nostrils 2 (two) times daily as needed for rhinitis.) 16 g 6   latanoprost (XALATAN) 0.005 % ophthalmic solution 1 drop at bedtime.     Magnesium 400 MG CAPS Take 400 mg by mouth every evening.     metFORMIN (GLUCOPHAGE) 500 MG tablet Take 500 mg by mouth 2 (two) times daily with a meal.      Multiple Vitamins-Minerals (MULTIVITAMIN WITH MINERALS) tablet Take 1 tablet by mouth daily. Alive     omega-3 acid ethyl esters (LOVAZA) 1 g capsule Take by mouth 2 (two) times daily.     spironolactone (ALDACTONE) 25 MG tablet Take 0.5 tablets (12.5 mg total) by mouth in the morning. 45 tablet 1   valsartan-hydrochlorothiazide (DIOVAN-HCT) 320-25 MG per tablet Take 1 tablet by mouth daily.     vitamin E 180 MG (400 UNITS) capsule vitamin E (dl, acetate) 536 mg (144 unit) capsule  Take 1 capsule every day by oral route.     Fluticasone-Umeclidin-Vilant (TRELEGY ELLIPTA) 100-62.5-25 MCG/ACT AEPB Inhale 1 puff into the lungs daily. 28 each 6   Fluticasone-Umeclidin-Vilant (TRELEGY ELLIPTA) 100-62.5-25 MCG/ACT AEPB Inhale 1 puff into the lungs daily. 14 each 0   No facility-administered medications prior to visit.   Review of Systems  Constitutional:  Negative for chills, fever, malaise/fatigue and  weight loss.  HENT:  Negative for congestion, sinus pain and sore throat.   Eyes: Negative.   Respiratory:  Positive for cough (dry). Negative for hemoptysis, sputum production, shortness of breath and wheezing.   Cardiovascular:  Negative for chest pain, palpitations, orthopnea, claudication and leg swelling.  Gastrointestinal:  Negative for abdominal pain, heartburn, nausea and vomiting.  Genitourinary: Negative.   Musculoskeletal:  Negative for joint pain and myalgias.  Skin:  Negative for rash.  Neurological:  Negative for weakness.  Endo/Heme/Allergies: Negative.   Psychiatric/Behavioral: Negative.     Objective:   Vitals:   07/27/21 1437  BP: 128/84  Pulse: 90  SpO2: 98%  Weight: 279 lb 6.4 oz (126.7 kg)  Height: 5\' 6"  (1.676 m)   Physical Exam Constitutional:      General: She is not in acute distress.    Appearance: She is obese. She is not ill-appearing.  HENT:     Head: Normocephalic and atraumatic.  Eyes:     General: No scleral icterus.    Conjunctiva/sclera: Conjunctivae normal.  Cardiovascular:     Rate and Rhythm: Normal rate and regular rhythm.     Pulses: Normal pulses.     Heart sounds: Normal heart sounds. No murmur heard. Pulmonary:     Effort: Pulmonary effort is normal.     Breath sounds: Normal breath sounds. No wheezing, rhonchi or rales.  Musculoskeletal:     Right lower leg: No edema.     Left lower leg: No edema.  Skin:    General: Skin is warm and dry.  Neurological:     General: No focal deficit present.     Mental Status: She is alert.  Psychiatric:        Mood and Affect: Mood normal.        Behavior: Behavior normal.  Thought Content: Thought content normal.        Judgment: Judgment normal.   CBC    Component Value Date/Time   WBC 9.4 06/12/2016 0652   RBC 3.81 (L) 06/12/2016 0652   HGB 10.3 (L) 06/12/2016 0652   HCT 32.8 (L) 06/12/2016 0652   PLT 233 06/12/2016 0652   MCV 86.1 06/12/2016 0652   MCH 27.0 06/12/2016  0652   MCHC 31.4 06/12/2016 0652   RDW 14.4 06/12/2016 0652   LYMPHSABS 3.3 05/31/2016 1441   MONOABS 0.6 05/31/2016 1441   EOSABS 0.2 05/31/2016 1441   BASOSABS 0.0 05/31/2016 1441   BMP Latest Ref Rng & Units 01/03/2020 12/27/2019 06/11/2016  Glucose 65 - 99 mg/dL 782(N131(H) 562(Z100(H) 308(M172(H)  BUN 8 - 27 mg/dL 18 15 11   Creatinine 0.57 - 1.00 mg/dL 5.780.93 4.690.93 6.290.87  BUN/Creat Ratio 12 - 28 19 16  -  Sodium 134 - 144 mmol/L 137 141 136  Potassium 3.5 - 5.2 mmol/L 4.2 4.2 3.9  Chloride 96 - 106 mmol/L 97 101 100(L)  CO2 20 - 29 mmol/L 24 25 28   Calcium 8.7 - 10.3 mg/dL 9.7 52.810.0 4.1(L8.7(L)   Chest imaging: HRCT Chest 07/09/21 1. There is mild pulmonary fibrosis in a pattern with apical to basal gradient featuring irregular peripheral interstitial opacity and septal thickening without evidence of bronchiolectasis or honeycombing. No significant air trapping on expiratory phase imaging. Findings are consistent with an early "indeterminate for UIP" pattern by ATS pulmonary fibrosis criteria, differential considerations including both NSIP and UIP. Consider ongoing annual follow-up to assess for change in fibrotic findings and pattern. Findings are indeterminate for UIP per consensus guidelines: Diagnosis of Idiopathic Pulmonary Fibrosis: An Official ATS/ERS/JRS/ALAT Clinical Practice Guideline. Am Rosezetta SchlatterJ Respir Crit Care Med Vol 198, Iss 5, [phone], Mar 22 2017. 2. Minimal emphysema. 3. Cardiomegaly.  CXR 10/30/20 Cardiomediastinal silhouette unchanged in size and contour. No pneumothorax. No pleural effusion. Coarsened interstitial markings similar to the prior. No pneumothorax pleural effusion or confluent airspace disease. No displaced fracture. Degenerative changes of the Spine  PFT: PFT Results Latest Ref Rng & Units 06/25/2021  FVC-Pre L 1.98  FVC-Predicted Pre % 75  FVC-Post L 2.13  FVC-Predicted Post % 80  Pre FEV1/FVC % % 71  Post FEV1/FCV % % 74  FEV1-Pre L 1.41  FEV1-Predicted Pre  % 69  FEV1-Post L 1.57  DLCO uncorrected ml/min/mmHg 20.86  DLCO UNC% % 99  DLCO corrected ml/min/mmHg 20.73  DLCO COR %Predicted % 99  DLVA Predicted % 136  TLC L 4.32  TLC % Predicted % 80  RV % Predicted % 92   Labs:  Path:  Echo 12/30/19: Normal LV 55-60%. Moderate LV hypertropy. Normal diastolic pattern. Mild Tricuspid regurgitation.   Heart Catheterization:  Assessment & Plan:   Centrilobular emphysema (HCC) - Plan: Budeson-Glycopyrrol-Formoterol (BREZTRI AEROSPHERE) 160-9-4.8 MCG/ACT AERO  Discussion: Margarito LinerMary Fleek is a 71 year old woman, former smoker with history of sleep apnea, DMII, GERD and asthma who returns to pulmonary clinic for bronchitis.   Her dry cough is possibly from the dry powder inhaler of trelegy ellipta. We will transition her to breztri inhaler therapy and monitor for improvement in her cough. Her shortness of breath and wheezing have improved with ICS/LAMA/LABA therapy.   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 Her DLCO is normal.  HRCT chest shows indeterminate pattern for UIP. I am not convinced at this time that she has interstitial  lung disease. These findings of some airway and interstitial thickening of the medial right lower lobe could be due to chronic reflux disease.   She is to continue pepcid for GERD and zyrtec/chlortab for allergies. She is to elevated the head of her bed to reduce nocturnal reflux.  Follow up in 6 months.   Melody ComasJonathan Thurmond Hildebran, MD [facility] Office: [phone]   Current Outpatient Medications:    acetaminophen (TYLENOL) 500 MG tablet, Take 1,000 mg by mouth 2 (two) times daily as needed. , Disp: , Rfl:    albuterol (PROVENTIL HFA;VENTOLIN HFA) 108 (90 BASE) MCG/ACT inhaler, Inhale 2 puffs into the lungs every 6 (six) hours as needed for wheezing or shortness of breath. , Disp: , Rfl:    amLODipine (NORVASC) 10  MG tablet, Take 10 mg by mouth daily. , Disp: , Rfl:    Ascorbic Acid (VITAMIN C) 1000 MG tablet, Take 1,000 mg by mouth daily., Disp: , Rfl:    aspirin EC 81 MG tablet, Take 81 mg by mouth daily., Disp: , Rfl:    benzonatate (TESSALON) 200 MG capsule, Take 1 capsule (200 mg total) by mouth 3 (three) times daily as needed for cough., Disp: 45 capsule, Rfl: 1   Budeson-Glycopyrrol-Formoterol (BREZTRI AEROSPHERE) 160-9-4.8 MCG/ACT AERO, Inhale 2 puffs into the lungs in the morning and at bedtime., Disp: 10.7 g, Rfl: 6   Calcium Carb-Cholecalciferol (CALCIUM + D3 PO), Take 1 tablet by mouth 2 (two) times daily., Disp: , Rfl:    cetirizine (ZYRTEC) 10 MG tablet, Take 10 mg by mouth daily as needed for allergies. , Disp: , Rfl:    chlorpheniramine (CHLOR-TRIMETON) 4 MG tablet, Take 4 mg by mouth 2 (two) times daily as needed for allergies., Disp: , Rfl:    Cholecalciferol (VITAMIN D3) 5000 units CAPS, Take 5,000 Units by mouth daily., Disp: , Rfl:    Clobetasol Prop Emollient Base 0.05 % emollient cream, Apply 1 application topically 2 (two) times daily., Disp: , Rfl:    Coenzyme Q10 (CO Q 10 PO), Take 1 tablet by mouth daily., Disp: , Rfl:    colesevelam (WELCHOL) 625 MG tablet, Take 1,875 mg by mouth 2 (two) times daily with a meal. , Disp: , Rfl:    cyanocobalamin 2000 MCG tablet, Take 2,000 mcg by mouth daily., Disp: , Rfl:    diclofenac sodium (VOLTAREN) 1 % GEL, Apply 2 g topically 3 (three) times daily as needed (pain)., Disp: , Rfl:    esomeprazole (NEXIUM) 40 MG capsule, Take 40 mg by mouth daily before breakfast., Disp: , Rfl:    eszopiclone (LUNESTA) 2 MG TABS tablet, eszopiclone 2 mg tablet, Disp: , Rfl:    fluticasone (FLONASE) 50 MCG/ACT nasal spray, Place 2 sprays into both nostrils daily. (Patient taking differently: Place 1 spray into both nostrils 2 (two) times daily as needed for rhinitis.), Disp: 16 g, Rfl: 6   latanoprost (XALATAN) 0.005 % ophthalmic solution, 1 drop at bedtime.,  Disp: , Rfl:    Magnesium 400 MG CAPS, Take 400 mg by mouth every evening., Disp: , Rfl:    metFORMIN (GLUCOPHAGE) 500 MG tablet, Take 500 mg by mouth 2 (two) times daily with a meal. , Disp: , Rfl:    Multiple Vitamins-Minerals (MULTIVITAMIN WITH MINERALS) tablet, Take 1 tablet by mouth daily. Alive, Disp: , Rfl:    omega-3 acid ethyl esters (LOVAZA) 1 g capsule, Take by mouth 2 (two) times daily., Disp: , Rfl:    spironolactone (ALDACTONE)  25 MG tablet, Take 0.5 tablets (12.5 mg total) by mouth in the morning., Disp: 45 tablet, Rfl: 1   valsartan-hydrochlorothiazide (DIOVAN-HCT) 320-25 MG per tablet, Take 1 tablet by mouth daily., Disp: , Rfl:    vitamin E 180 MG (400 UNITS) capsule, vitamin E (dl, acetate) 161 mg (096 unit) capsule  Take 1 capsule every day by oral route., Disp: , Rfl:

## 2021-08-06 ENCOUNTER — Encounter: Payer: Self-pay | Admitting: Pulmonary Disease

## 2021-10-31 ENCOUNTER — Telehealth: Payer: Self-pay | Admitting: Pulmonary Disease

## 2021-11-01 ENCOUNTER — Other Ambulatory Visit (HOSPITAL_COMMUNITY): Payer: Self-pay

## 2021-11-01 NOTE — Telephone Encounter (Signed)
Pt has tried and failed Trelegy, Symbicort, Spiriva, Dulera ? ? ?Routing to prior auth team to see if a PA might be able to be done to bring cost down for pt for the [employer] inhaler. ?

## 2021-11-02 ENCOUNTER — Other Ambulatory Visit (HOSPITAL_COMMUNITY): Payer: Self-pay

## 2021-11-02 NOTE — Telephone Encounter (Signed)
Patient Advocate Encounter ? ?Received notification from [facility] that the request for prior authorization tier exception for Breanna Ross has been denied due to:  ?  ?Specialty Pharmacy Patient Advocate ?Fax: [phone]  ?

## 2021-11-02 NOTE — Telephone Encounter (Signed)
Called [facility] and submitted tier exception PA for [employer]. ?

## 2021-11-05 NOTE — Telephone Encounter (Signed)
Dr. Erin Fulling please advise on alternative for inhaler as patient says Judithann Sauger is to expensive ?

## 2021-11-05 NOTE — Telephone Encounter (Signed)
ATC patient, per DPR left message with Dr. Lanora Manis recommendations and asked patient to call the office back tomorrow if she would like prescriptions sent to pharmacy. Will wait to hear from patient  ?

## 2021-11-05 NOTE — Telephone Encounter (Signed)
Symbicort 160-4.79mcg 2 puffs daily + spiriva 2.41mcg 2 puffs daily ? ?Thanks, ?JD ?

## 2021-11-07 ENCOUNTER — Telehealth: Payer: Self-pay | Admitting: Pulmonary Disease

## 2021-11-07 NOTE — Telephone Encounter (Signed)
Breanna Starr, MD ?  ?   1:19 PM ?Note ?Symbicort 160-4.22mcg 2 puffs daily + spiriva 2.7mcg 2 puffs daily ?  ?Thanks, ?JD  ?  ? ?Patient called back and stated that [name] and Spiriva are in tier 3 for insurance and to expensive. She states that the generic of Symbicort is covered and fine. RX for generic Symbicort can be sent in and could possibly do patient assistance for Spiriva if needed. Please advise ?

## 2021-11-07 NOTE — Telephone Encounter (Signed)
ATC patient x2, LMTCB per protocol will close this encounter after 2 unsuccessful attempts. ?

## 2021-11-07 NOTE — Telephone Encounter (Signed)
Symbicort will work. 160-4.68mcg 2 puffs twice daily. We can do spiriva with patient assistance or is incruse covered by her insurance? ? ?Thanks, ?JD ? ? ?

## 2021-11-08 MED ORDER — BUDESONIDE-FORMOTEROL FUMARATE 160-4.5 MCG/ACT IN AERO: 2.0000 | INHALATION_SPRAY | Freq: Every day | RESPIRATORY_TRACT | 6 refills | Status: DC

## 2021-11-08 NOTE — Telephone Encounter (Signed)
Spoke with pt and placed order for Symbicort and mailed pt assistance for Spiriva was mailed to pt. Nothing further needed at this time.  ?

## 2021-11-16 ENCOUNTER — Other Ambulatory Visit: Payer: Self-pay | Admitting: Obstetrics and Gynecology

## 2021-11-16 DIAGNOSIS — Z1231 Encounter for screening mammogram for malignant neoplasm of breast: Secondary | ICD-10-CM

## 2021-11-30 ENCOUNTER — Ambulatory Visit
Admission: RE | Admit: 2021-11-30 | Discharge: 2021-11-30 | Disposition: A | Discharge status: Home / Self Care | Payer: Medicare Other | Source: Ambulatory Visit | Attending: Obstetrics and Gynecology | Admitting: Obstetrics and Gynecology

## 2021-11-30 DIAGNOSIS — Z1231 Encounter for screening mammogram for malignant neoplasm of breast: Secondary | ICD-10-CM

## 2021-12-03 DIAGNOSIS — E1165 Type 2 diabetes mellitus with hyperglycemia: Secondary | ICD-10-CM | POA: Insufficient documentation

## 2022-01-24 ENCOUNTER — Other Ambulatory Visit: Payer: Self-pay | Admitting: Orthopedic Surgery

## 2022-01-24 DIAGNOSIS — M25511 Pain in right shoulder: Secondary | ICD-10-CM

## 2022-02-03 ENCOUNTER — Ambulatory Visit
Admission: RE | Admit: 2022-02-03 | Discharge: 2022-02-03 | Disposition: A | Discharge status: Home / Self Care | Payer: Medicare Other | Source: Ambulatory Visit | Attending: Orthopedic Surgery | Admitting: Orthopedic Surgery

## 2022-02-03 DIAGNOSIS — M25511 Pain in right shoulder: Secondary | ICD-10-CM

## 2022-09-09 ENCOUNTER — Ambulatory Visit
Admission: EM | Admit: 2022-09-09 | Discharge: 2022-09-09 | Disposition: A | Discharge status: Home / Self Care | Payer: Medicare Other | Attending: Internal Medicine | Admitting: Internal Medicine

## 2022-09-09 DIAGNOSIS — J069 Acute upper respiratory infection, unspecified: Secondary | ICD-10-CM | POA: Insufficient documentation

## 2022-09-09 DIAGNOSIS — Z1152 Encounter for screening for COVID-19: Secondary | ICD-10-CM | POA: Insufficient documentation

## 2022-09-09 MED ORDER — PREDNISONE 20 MG PO TABS: 40.0000 mg | ORAL_TABLET | Freq: Every day | ORAL | 0 refills | Status: AC

## 2022-09-09 MED ORDER — BENZONATATE 100 MG PO CAPS: 100.0000 mg | ORAL_CAPSULE | Freq: Three times a day (TID) | ORAL | 0 refills | Status: DC | PRN

## 2022-09-09 NOTE — ED Triage Notes (Signed)
Pt c/o cough, chest congestion, nasal drainage, pressure in face, fatigue, malaise   Onset ~ thurs   Denies pain in triage

## 2022-09-09 NOTE — ED Provider Notes (Signed)
[facility]    CSN: [id number] Arrival date & time: 09/09/22  1527      History   Chief Complaint Chief Complaint  Patient presents with   Cough    HPI BRADY Breanna Ross is a 72 y.o. female.   Patient presents with cough, feelings of chest congestion, nasal congestion, sinus pressure, fatigue that started about 4 to 5 days ago.  Patient denies any known fevers or sick contacts.  Patient reports history of asthma and has been using albuterol inhaler with minimal improvement in symptoms.  Also takes Symbicort twice daily.  Denies chest pain, sore throat, ear pain, nausea, vomiting, diarrhea, abdominal pain.   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   Primary localized osteoarthritis of right knee 03/23/2014   Left knee DJD 06/21/2013   DJD (degenerative joint disease) of knee 06/21/2013   S/P left total knee arthroplasty 06/21/2013   Hypertension    GERD (gastroesophageal reflux disease)    Diabetes ([place])     Past Surgical History:  Procedure Laterality Date   COLONOSCOPY     HEMORRHOID SURGERY     JOINT REPLACEMENT     KNEE ARTHROSCOPY W/ MENISCECTOMY Left 09-29-09   REDUCTION MAMMAPLASTY Bilateral 1998   SHOULDER ARTHROSCOPY W/ ROTATOR CUFF REPAIR Bilateral 2007   TOTAL KNEE ARTHROPLASTY Left 06/21/2013   Dr Noemi Chapel   TOTAL KNEE ARTHROPLASTY Left 06/21/2013   Procedure: TOTAL KNEE ARTHROPLASTY- left;  Surgeon: Lorn Junes, MD;  Location: [place];  Service:  Orthopedics;  Laterality: Left;   TOTAL KNEE ARTHROPLASTY Right 04/11/2014   Procedure: TOTAL KNEE ARTHROPLASTY;  Surgeon: Lorn Junes, MD;  Location: [place];  Service: Orthopedics;  Laterality: Right;   TOTAL KNEE REVISION Left 06/10/2016   Procedure: TOTAL KNEE REVISION;  Surgeon: Frederik Pear, MD;  Location: [place];  Service: Orthopedics;  Laterality: Left;   TUBAL LIGATION      OB History   No obstetric history on file.      Home Medications    Prior to Admission medications   Medication Sig Start Date End Date Taking? Authorizing Provider  acetaminophen (TYLENOL) 500 MG tablet Take 1,000 mg by mouth 2 (two) times daily as needed.     [provider]  albuterol (PROVENTIL HFA;VENTOLIN HFA) 108 (90 BASE) MCG/ACT inhaler Inhale 2 puffs into the lungs every 6 (six) hours as needed for wheezing or shortness of breath.     [provider]  amLODipine (NORVASC) 10 MG tablet Take 10 mg by mouth daily.     [provider]  Ascorbic Acid (VITAMIN C) 1000 MG tablet Take 1,000 mg by mouth daily.    [provider]  aspirin EC 81 MG tablet Take 81 mg by mouth daily.    [provider]  benzonatate (TESSALON) 100 MG capsule Take 1 capsule (100 mg total) by mouth every 8 (eight) hours as needed for  cough. 09/09/22  Yes Kalyb Pemble, [place] E, FNP  Budeson-Glycopyrrol-Formoterol (BREZTRI AEROSPHERE) 160-9-4.8 MCG/ACT AERO Inhale 2 puffs into the lungs in the morning and at bedtime. 07/27/21   Freddi Starr, MD  budesonide-formoterol (SYMBICORT) 160-4.5 MCG/ACT inhaler Inhale 2 puffs into the lungs daily. 11/08/21   Freddi Starr, MD  Calcium Carb-Cholecalciferol (CALCIUM + D3 PO) Take 1 tablet by mouth 2 (two) times daily.    [provider]  cetirizine (ZYRTEC) 10 MG tablet Take 10 mg by mouth daily as needed for allergies.     [provider]  chlorpheniramine (CHLOR-TRIMETON) 4 MG tablet Take 4 mg by mouth 2 (two) times daily as needed  for allergies.    [provider]  Cholecalciferol (VITAMIN D3) 5000 units CAPS Take 5,000 Units by mouth daily.    [provider]  Clobetasol Prop Emollient Base 0.05 % emollient cream Apply 1 application topically 2 (two) times daily.    [provider]  Coenzyme Q10 (CO Q 10 PO) Take 1 tablet by mouth daily.    [provider]  colesevelam (WELCHOL) 625 MG tablet Take 1,875 mg by mouth 2 (two) times daily with a meal.     [provider]  cyanocobalamin 2000 MCG tablet Take 2,000 mcg by mouth daily.    [provider]  diclofenac sodium (VOLTAREN) 1 % GEL Apply 2 g topically 3 (three) times daily as needed (pain).    [provider]  esomeprazole (NEXIUM) 40 MG capsule Take 40 mg by mouth daily before breakfast.    [provider]  eszopiclone (LUNESTA) 2 MG TABS tablet eszopiclone 2 mg tablet    [provider]  fluticasone (FLONASE) 50 MCG/ACT nasal spray Place 2 sprays into both nostrils daily. Patient taking differently: Place 1 spray into both nostrils 2 (two) times daily as needed for rhinitis. 10/24/14   Elby Beck, FNP  latanoprost (XALATAN) 0.005 % ophthalmic solution 1 drop at bedtime. 05/04/21   [provider]  Magnesium 400 MG CAPS Take 400 mg by mouth every evening.    [provider]  metFORMIN (GLUCOPHAGE) 500 MG tablet Take 500 mg by mouth 2 (two) times daily with a meal.     [provider]  Multiple Vitamins-Minerals (MULTIVITAMIN WITH MINERALS) tablet Take 1 tablet by mouth daily. Alive    [provider]  omega-3 acid ethyl esters (LOVAZA) 1 g capsule Take by mouth 2 (two) times daily.    [provider]  OZEMPIC, 0.25 OR 0.5 MG/DOSE, 2 MG/3ML SOPN INJECT 0.5 MG SUBCUTANEOUSLY EVERY WEEK    [provider]  predniSONE (DELTASONE) 20 MG tablet Take 2 tablets (40 mg total) by mouth daily for 5 days. 09/09/22 09/14/22 Yes Keslyn Teater, Michele Rockers, FNP   spironolactone (ALDACTONE) 25 MG tablet Take 0.5 tablets (12.5 mg total) by mouth in the morning. 06/19/20   Tolia, Sunit, DO  valsartan-hydrochlorothiazide (DIOVAN-HCT) 320-25 MG per tablet Take 1 tablet by mouth daily.    [provider]  vitamin E 180 MG (400 UNITS) capsule vitamin E (dl, acetate) 180 mg (400 unit) capsule  Take 1 capsule every day by oral route.    [provider]    Family History Family History  Problem Relation Age of Onset   Hypertension Mother    Stroke Mother    Hypertension Father    Heart attack Father    Hypertension Sister    Hypertension Sister    Hypertension Sister  Hypertension Sister    Hypertension Sister    Diabetes Sister    Stroke Brother    Diabetes Brother    Diabetes Brother    Diabetes Brother    Breast cancer Neg Hx     Social History Social History   Tobacco Use   Smoking status: Former    Packs/day: 1.00    Years: 37.00    Total pack years: 37.00    Types: Cigarettes    Quit date: 06/08/2006    Years since quitting: 16.2   Smokeless tobacco: Never  Vaping Use   Vaping Use: Never used  Substance Use Topics   Alcohol use: Yes    Comment: 06/10/2016 "might have a drink once/year"   Drug use: No     Allergies   Dilaudid [hydromorphone hcl], Penicillins, and Oxycodone   Review of Systems Review of Systems Per HPI  Physical Exam Triage Vital Signs ED Triage Vitals  Enc Vitals Group     BP 09/09/22 1723 129/85     Pulse Rate 09/09/22 1723 95     Resp 09/09/22 1723 18     Temp 09/09/22 1723 97.8 F (36.6 C)     Temp Source 09/09/22 1723 Oral     SpO2 09/09/22 1723 93 %     Weight --      Height --      Head Circumference --      Peak Flow --      Pain Score 09/09/22 1724 0     Pain Loc --      Pain Edu? --      Excl. in [place]? --    No data found.  Updated Vital Signs BP 129/85 (BP Location: Right Arm)   Pulse 95   Temp 97.8 F (36.6 C) (Oral)   Resp 18   SpO2 93%   Visual  Acuity Right Eye Distance:   Left Eye Distance:   Bilateral Distance:    Right Eye Near:   Left Eye Near:    Bilateral Near:     Physical Exam Constitutional:      General: She is not in acute distress.    Appearance: Normal appearance. She is not toxic-appearing or diaphoretic.  HENT:     Head: Normocephalic and atraumatic.     Right Ear: Tympanic membrane and ear canal normal.     Left Ear: Tympanic membrane and ear canal normal.     Nose: Congestion present.     Mouth/Throat:     Mouth: Mucous membranes are moist.     Pharynx: Posterior oropharyngeal erythema present.  Eyes:     Extraocular Movements: Extraocular movements intact.     Conjunctiva/sclera: Conjunctivae normal.     Pupils: Pupils are equal, round, and reactive to light.  Cardiovascular:     Rate and Rhythm: Normal rate and regular rhythm.     Pulses: Normal pulses.     Heart sounds: Normal heart sounds.  Pulmonary:     Effort: Pulmonary effort is normal. No respiratory distress.     Breath sounds: Normal breath sounds. No stridor. No wheezing, rhonchi or rales.  Abdominal:     General: Abdomen is flat. Bowel sounds are normal.     Palpations: Abdomen is soft.  Musculoskeletal:        General: Normal range of motion.     Cervical back: Normal range of motion.  Skin:    General: Skin is warm and dry.  Neurological:  General: No focal deficit present.     Mental Status: She is alert and oriented to person, place, and time. Mental status is at baseline.  Psychiatric:        Mood and Affect: Mood normal.        Behavior: Behavior normal.      UC Treatments / Results  Labs (all labs ordered are listed, but only abnormal results are displayed) Labs Reviewed  SARS CORONAVIRUS 2 (TAT 6-24 HRS)    EKG   Radiology No results found.  Procedures Procedures (including critical care time)  Medications Ordered in UC Medications - No data to display  Initial Impression / Assessment and Plan / UC  Course  I have reviewed the triage vital signs and the nursing notes.  Pertinent labs & imaging results that were available during my care of the patient were reviewed by me and considered in my medical decision making (see chart for details).     Patient presents with symptoms likely from a viral upper respiratory infection.  Patient is nontoxic appearing and not in need of emergent medical intervention.  There are no adventitious lung sounds on exam or signs of respiratory compromise so do not think that chest imaging is necessary.  COVID test is pending.  Highly suspicious of asthma exacerbation given patient's symptoms have been refractory to albuterol inhaler.  Will treat with prednisone.  Patient has taken this before and tolerated well.  Advised her to monitor blood glucose very closely while on prednisone. Patient voiced understanding. Also advised supportive care. Return if symptoms fail to improve. Patient states understanding and is agreeable.  Discharged with PCP followup.  Final Clinical Impressions(s) / UC Diagnoses   Final diagnoses:  Viral upper respiratory tract infection with cough     Discharge Instructions      It appears that you have a viral illness.  I have prescribed you prednisone and a cough medication.  Monitor your blood sugar very closely while on prednisone.  Stop taking it if your blood sugar is elevated.  COVID test is pending.  Will call if it is positive.    ED Prescriptions     Medication Sig Dispense Auth. Provider   predniSONE (DELTASONE) 20 MG tablet Take 2 tablets (40 mg total) by mouth daily for 5 days. 10 tablet [place], [place] E, [place]   benzonatate (TESSALON) 100 MG capsule Take 1 capsule (100 mg total) by mouth every 8 (eight) hours as needed for cough. 21 capsule [place], Michele Rockers, [place]      PDMP not reviewed this encounter.   Teodora Medici, [place] 09/09/22 [phone]

## 2022-09-09 NOTE — Discharge Instructions (Signed)
It appears that you have a viral illness.  I have prescribed you prednisone and a cough medication.  Monitor your blood sugar very closely while on prednisone.  Stop taking it if your blood sugar is elevated.  COVID test is pending.  Will call if it is positive.

## 2022-09-10 LAB — SARS CORONAVIRUS 2 (TAT 6-24 HRS): SARS Coronavirus 2: NEGATIVE

## 2022-10-14 IMAGING — CR DG CHEST 2V
2 series · 2 of 2 positions shown · non-contrast
Comparison: 10/27/2019, 08/04/2018

CLINICAL DATA: 69-year-old female with a history of cough

EXAM:
CHEST - 2 VIEW

[w chest pa]
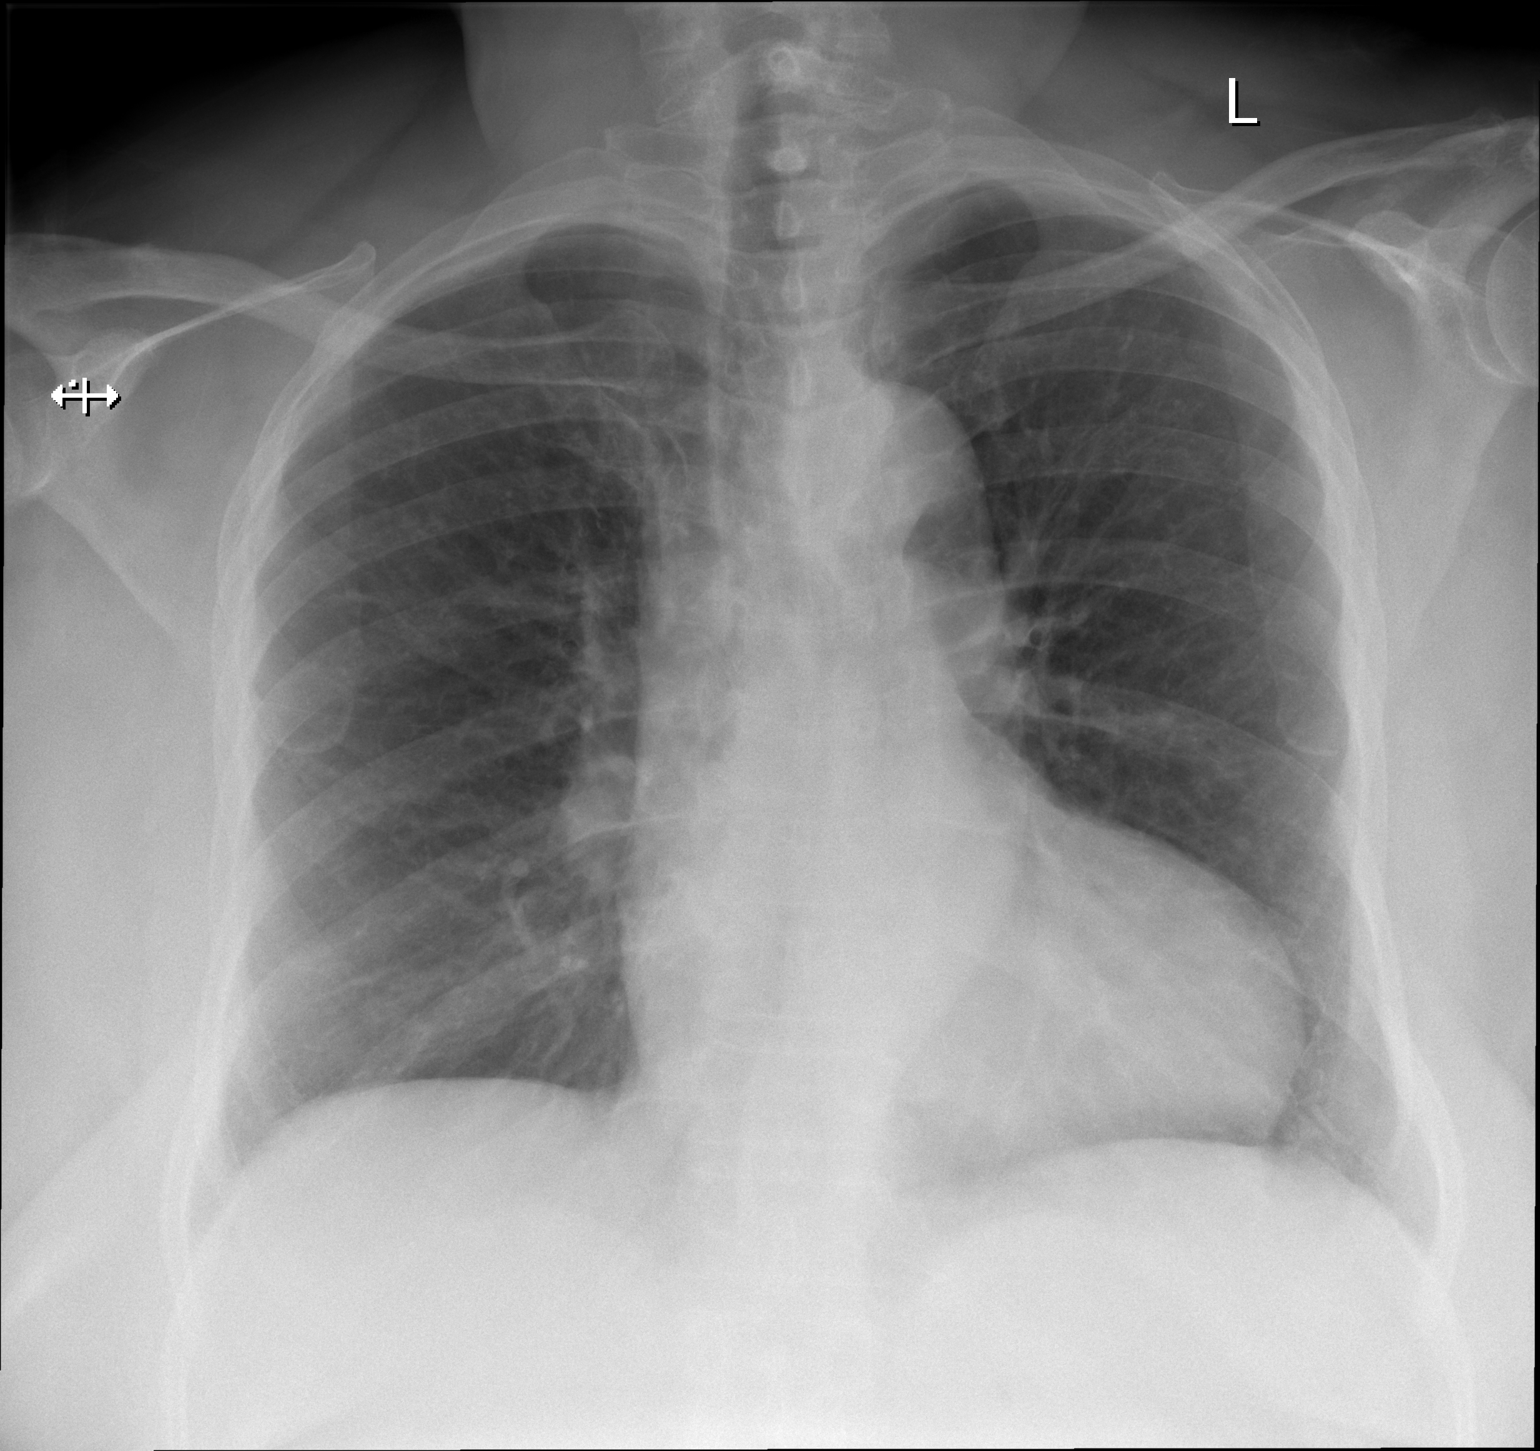

[w chest lat]
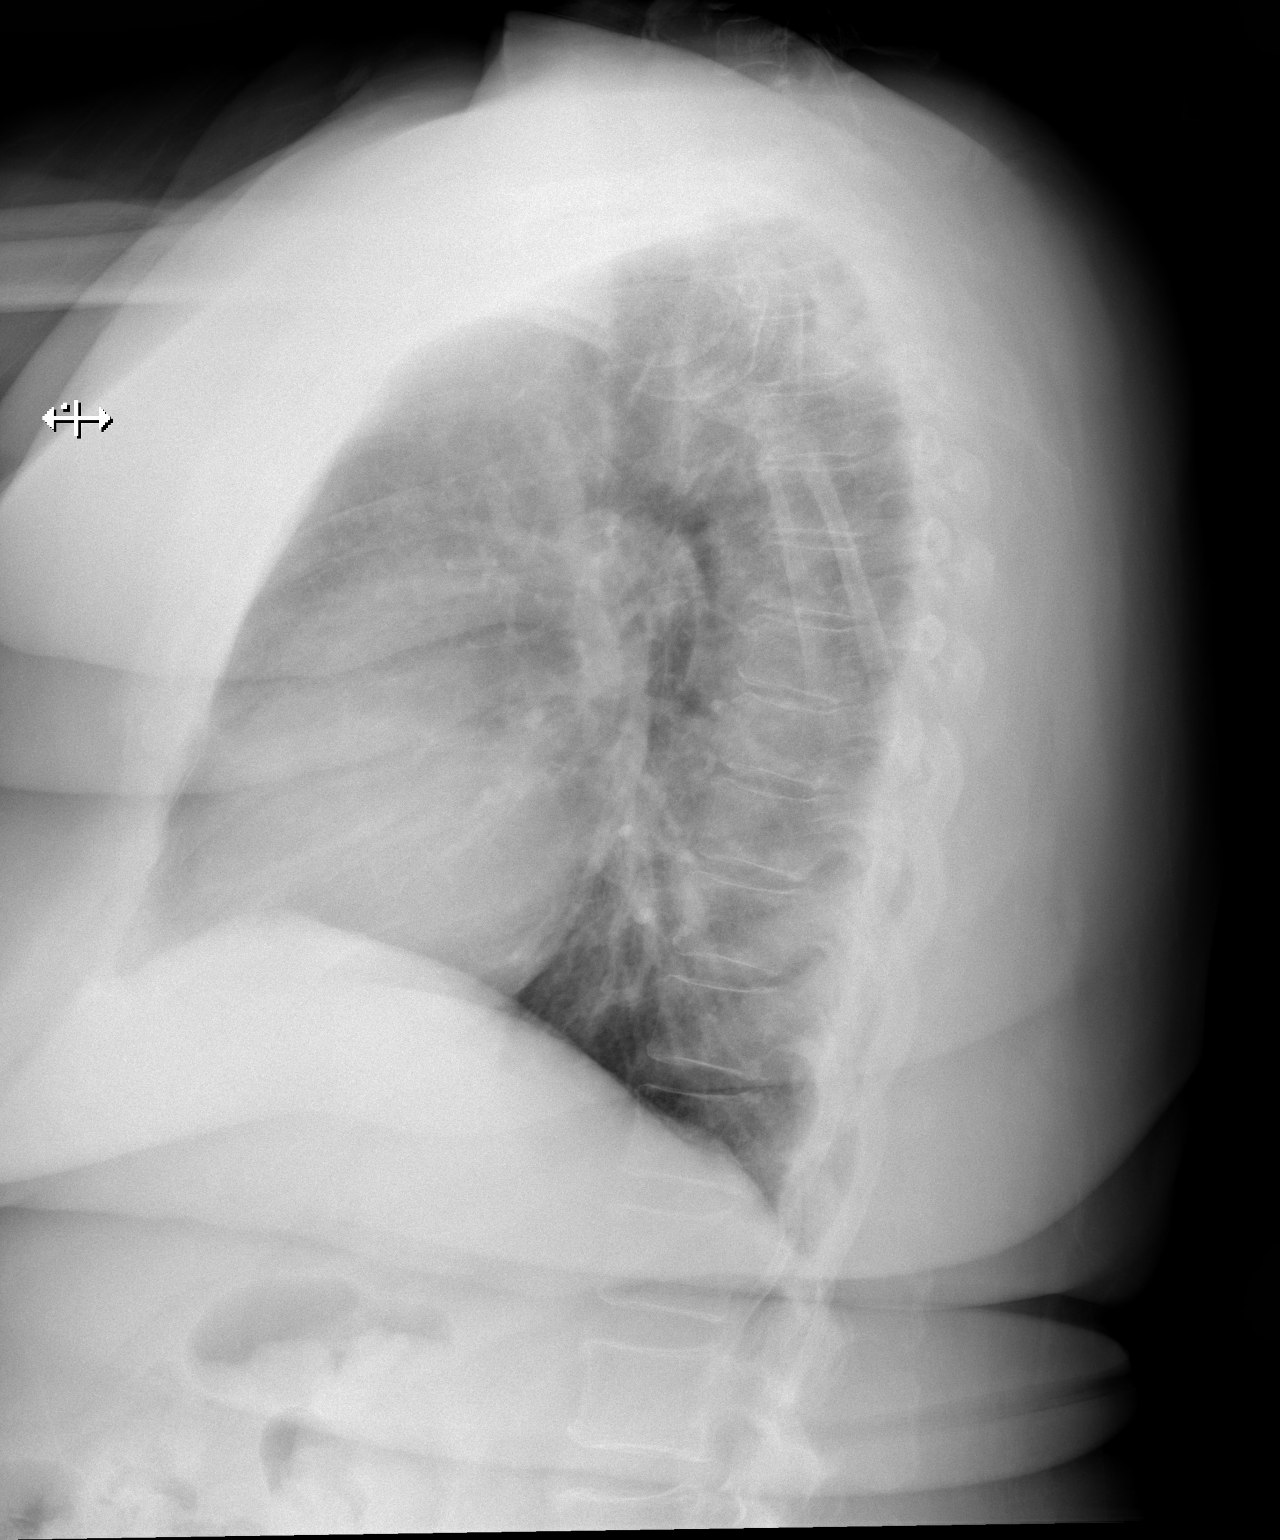

[2 of 2 positions shown; findings below may reference images not displayed]

FINDINGS: Cardiomediastinal silhouette unchanged in size and contour. No
pneumothorax. No pleural effusion. Coarsened interstitial markings
similar to the prior. No pneumothorax pleural effusion or confluent
airspace disease. No displaced fracture. Degenerative changes of the
spine.
IMPRESSION: Negative for acute cardiopulmonary disease

## 2022-11-02 ENCOUNTER — Other Ambulatory Visit: Payer: Self-pay | Admitting: Pulmonary Disease

## 2022-11-18 ENCOUNTER — Other Ambulatory Visit: Payer: Self-pay | Admitting: Obstetrics and Gynecology

## 2022-11-18 DIAGNOSIS — Z0000 Encounter for general adult medical examination without abnormal findings: Secondary | ICD-10-CM

## 2022-12-20 ENCOUNTER — Ambulatory Visit
Admission: RE | Admit: 2022-12-20 | Discharge: 2022-12-20 | Disposition: A | Discharge status: Home / Self Care | Payer: Medicare Other | Source: Ambulatory Visit | Attending: Obstetrics and Gynecology | Admitting: Obstetrics and Gynecology

## 2022-12-20 DIAGNOSIS — Z0000 Encounter for general adult medical examination without abnormal findings: Secondary | ICD-10-CM

## 2023-01-13 ENCOUNTER — Other Ambulatory Visit: Payer: Self-pay | Admitting: Pulmonary Disease

## 2023-02-12 ENCOUNTER — Ambulatory Visit (INDEPENDENT_AMBULATORY_CARE_PROVIDER_SITE_OTHER): Payer: Medicare Other | Admitting: Adult Health

## 2023-02-12 ENCOUNTER — Encounter (INDEPENDENT_AMBULATORY_CARE_PROVIDER_SITE_OTHER): Payer: Self-pay | Admitting: Adult Health

## 2023-02-12 VITALS — BP 132/85 | HR 74 | Temp 98.1°F | Ht 66.0 in | Wt 267.0 lb

## 2023-02-12 DIAGNOSIS — I10 Essential (primary) hypertension: Secondary | ICD-10-CM

## 2023-02-12 DIAGNOSIS — Z0289 Encounter for other administrative examinations: Secondary | ICD-10-CM

## 2023-02-12 DIAGNOSIS — Z6841 Body Mass Index (BMI) 40.0 and over, adult: Secondary | ICD-10-CM

## 2023-02-12 NOTE — Progress Notes (Signed)
Office: [phone]  /  Fax: [phone]   Initial Visit  Breanna Ross was seen in clinic today to evaluate for obesity. She is interested in losing weight to improve overall health and reduce the risk of weight related complications. She presents today to review program treatment options, initial physical assessment, and evaluation.     She was referred by: PCP  When asked what else they would like to accomplish? She states: Adopt healthier eating patterns, Improve energy levels and physical activity, Improve existing medical conditions, Reduce number of medications, and Improve quality of life  Weight history: Current age 72.  She reports gaining weight since late 56s, early 74s  When asked how has your weight affected you? She states: Contributed to medical problems, Contributed to orthopedic problems or mobility issues, Having fatigue, and Having poor endurance  Some associated conditions: Hypertension, Arthritis: , Hyperlipidemia, and Diabetes  Contributing factors: Nutritional, Medications, Stress, Reduced physical activity, Eating patterns, and Menopause  Weight promoting medications identified: None  Current nutrition plan: None  Current level of physical activity: None  Current or previous pharmacotherapy: GLP-1  Response to medication:  Has lost steadily that last several months   Past medical history includes:   Past Medical History:  Diagnosis Date   Arthritis    BIlateral Knees (06/10/2016)   Asthma    GERD (gastroesophageal reflux disease)    Hypercholesteremia    Hypertension    Primary localized osteoarthritis of right knee    Shortness of breath    with exertion   Sleep apnea    unable to use cpap (06/10/2016)   Type 2 diabetes mellitus (HCC)      Objective:   BP 132/85   Pulse 74   Temp 98.1 F (36.7 C)   Ht 5\' 6"  (1.676 m)   Wt 267 lb (121.1 kg)   SpO2 98%   BMI 43.09 kg/m  She was weighed on the bioimpedance scale: Body mass index  is 43.09 kg/m.  Peak Weight:295 , Body Fat%:50.4, Visceral Fat Rating:18, Weight trend over the last 12 months: Decreasing  General:  Alert, oriented and cooperative. Patient is in no acute distress.  Respiratory: Normal respiratory effort, no problems with respiration noted   Gait: able to ambulate independently  Mental Status: Normal mood and affect. Normal behavior. Normal judgment and thought content.   DIAGNOSTIC DATA REVIEWED:  BMET    Component Value Date/Time   NA 137 01/03/2020 1007   K 4.2 01/03/2020 1007   CL 97 01/03/2020 1007   CO2 24 01/03/2020 1007   GLUCOSE 131 (H) 01/03/2020 1007   GLUCOSE 172 (H) 06/11/2016 0433   BUN 18 01/03/2020 1007   CREATININE 0.93 01/03/2020 1007   CALCIUM 9.7 01/03/2020 1007   GFRNONAA 64 01/03/2020 1007   GFRAA 74 01/03/2020 1007   Lab Results  Component Value Date   HGBA1C 6.8 (H) 06/10/2016   HGBA1C 6.8 (H) 06/21/2013   No results found for: "INSULIN" CBC    Component Value Date/Time   WBC 9.4 06/12/2016 0652   RBC 3.81 (L) 06/12/2016 0652   HGB 10.3 (L) 06/12/2016 0652   HCT 32.8 (L) 06/12/2016 0652   PLT 233 06/12/2016 0652   MCV 86.1 06/12/2016 0652   MCH 27.0 06/12/2016 0652   MCHC 31.4 06/12/2016 0652   RDW 14.4 06/12/2016 0652   Iron/TIBC/Ferritin/ %Sat No results found for: "IRON", "TIBC", "FERRITIN", "IRONPCTSAT" Lipid Panel  No results found for: "CHOL", "TRIG", "HDL", "CHOLHDL", "VLDL", "LDLCALC", "LDLDIRECT"  Hepatic Function Panel     Component Value Date/Time   PROT 8.2 04/01/2014 1104   ALBUMIN 3.9 04/01/2014 1104   AST 19 04/01/2014 1104   ALT 19 04/01/2014 1104   ALKPHOS 101 04/01/2014 1104   BILITOT 0.3 04/01/2014 1104   No results found for: "TSH"   Assessment and Plan:   Hypertension, unspecified type  Morbid obesity (HCC), Starting BMI 43.12  ESTABLISH WITH HWW    Obesity Treatment / Action Plan:  Patient will work on garnering support from family and friends to begin weight loss  journey. Will work on eliminating or reducing the presence of highly palatable, calorie dense foods in the home. Will complete provided nutritional and psychosocial assessment questionnaire before the next appointment. Will be scheduled for indirect calorimetry to determine resting energy expenditure in a fasting state.  This will allow [place] to create a reduced calorie, high-protein meal plan to promote loss of fat mass while preserving muscle mass. Counseled on the health benefits of losing 5%-15% of total body weight. Was counseled on nutritional approaches to weight loss and benefits of reducing processed foods and consuming plant-based foods and high quality protein as part of nutritional weight management. Was counseled on pharmacotherapy and role as an adjunct in weight management.   Obesity Education Performed Today:  She was weighed on the bioimpedance scale and results were discussed and documented in the synopsis.  We discussed obesity as a disease and the importance of a more detailed evaluation of all the factors contributing to the disease.  We discussed the importance of long term lifestyle changes which include nutrition, exercise and behavioral modifications as well as the importance of customizing this to her specific health and social needs.  We discussed the benefits of reaching a healthier weight to alleviate the symptoms of existing conditions and reduce the risks of the biomechanical, metabolic and psychological effects of obesity.  [name] appears to be in the action stage of change and states they are ready to start intensive lifestyle modifications and behavioral modifications.  30 minutes was spent today on this visit including the above counseling, pre-visit chart review, and post-visit documentation.  Reviewed by clinician on day of visit: allergies, medications, problem list, medical history, surgical history, family history, social history, and previous  encounter notes pertinent to obesity diagnosis.   Kriss Ishler d. Jilda Kress, NP-C

## 2023-03-10 ENCOUNTER — Other Ambulatory Visit: Payer: Self-pay | Admitting: Pulmonary Disease

## 2023-03-19 ENCOUNTER — Ambulatory Visit (INDEPENDENT_AMBULATORY_CARE_PROVIDER_SITE_OTHER): Payer: Medicare Other | Admitting: Family Medicine

## 2023-03-19 ENCOUNTER — Encounter (INDEPENDENT_AMBULATORY_CARE_PROVIDER_SITE_OTHER): Payer: Self-pay | Admitting: Family Medicine

## 2023-03-19 DIAGNOSIS — E1159 Type 2 diabetes mellitus with other circulatory complications: Secondary | ICD-10-CM

## 2023-03-19 DIAGNOSIS — Z1331 Encounter for screening for depression: Secondary | ICD-10-CM

## 2023-03-19 DIAGNOSIS — R5383 Other fatigue: Secondary | ICD-10-CM

## 2023-03-19 DIAGNOSIS — R0602 Shortness of breath: Secondary | ICD-10-CM

## 2023-03-19 DIAGNOSIS — E559 Vitamin D deficiency, unspecified: Secondary | ICD-10-CM

## 2023-03-19 DIAGNOSIS — J449 Chronic obstructive pulmonary disease, unspecified: Secondary | ICD-10-CM

## 2023-03-19 DIAGNOSIS — Z7985 Long-term (current) use of injectable non-insulin antidiabetic drugs: Secondary | ICD-10-CM

## 2023-03-19 DIAGNOSIS — I152 Hypertension secondary to endocrine disorders: Secondary | ICD-10-CM

## 2023-03-19 DIAGNOSIS — E1169 Type 2 diabetes mellitus with other specified complication: Secondary | ICD-10-CM

## 2023-03-19 DIAGNOSIS — Z6841 Body Mass Index (BMI) 40.0 and over, adult: Secondary | ICD-10-CM

## 2023-03-19 NOTE — Progress Notes (Signed)
Carlye Grippe, D.O.  ABFM, ABOM Specializing in Clinical Bariatric Medicine Office located at: [address]  [place], [place]  [address]     Bariatric Medicine Visit  Dear [name], MD   Thank you for referring ZULEMA CAROTHERS to our clinic today for evaluation.  We performed a consultation to discuss her options for treatment and educate the patient on her disease state.  The following note includes my evaluation and treatment recommendations.   Please do not hesitate to reach out to me directly if you have any further concerns.    Assessment and Plan:  Review labs with pt next OV.  Orders Placed This Encounter  Procedures   CBC with Differential/Platelet   VITAMIN D 25 Hydroxy (Vit-D Deficiency, Fractures)   Comprehensive metabolic panel   Folate   Hemoglobin A1c   Insulin, random   Lipid Panel With LDL/HDL Ratio   T4, free   TSH   Vitamin B12   EKG 12-Lead   Medications Discontinued During This Encounter  Medication Reason   Ascorbic Acid (VITAMIN C) 1000 MG tablet      Fatigue Assessment:  Teague does feel that her weight is causing her energy to be lower than it should be. Fatigue may be related to obesity, depression or many other causes. she does not appear to have any red flag symptoms and this appears to most likely be related to her current lifestyle habits and dietary intake.  Plan:  Labs will be ordered and reviewed with her at their next office visit in two weeks.  Epworth sleepiness scale score appears to be within normal limits.  Her ESS score is 5.   Latesha denies daytime somnolence and feels some days she wakes up still tired. Patient has a history of symptoms of hypertension. Zahrah generally gets 6 or 7 hours of sleep per night, and states that she has generally restful sleep. Snoring is not present. Apneic episodes is not present.   ECG: Performed and reviewed/ interpreted independently.  Normal sinus rhythm, rate 70bpm; reassuring  without any acute abnormalities, will continue to monitor for symptoms   Modified PHQ-9 Depression Screen: Her Food and Mood (modified PHQ-9) score was 5.  In the meanwhile, Emersynn will focus on self care including making healthy food choices by following their meal plan, improving sleep quality and focusing on stress reduction.  Once we are assured she is on an appropriate meal plan, we will start discussing exercise to increase cardiovascular fitness levels.    Shortness of breath on exertion Assessment:  Kylan does feel that she gets out of breath more easily than she used to when she exercises and seems to be worsening over time with weight gain.  This has gotten worse recently. Audreena denies shortness of breath at rest or orthopnea. Kihanna's shortness of breath appears to be obesity related and exercise induced, as they do not appear to have any "red flag" symptoms/ concerns today.  Also, this condition appears to be related to a state of poor cardiovascular conditioning   Plan:  Obtain labs today and will be reviewed with her at their next office visit in two weeks.  Indirect Calorimeter completed today to help guide our dietary regimen. It shows a VO2 of 227 and a REE of 1570.  Her calculated basal metabolic rate is 8756 thus her measured basal metabolic rate is worse than expected.  Patient agreed to work on weight loss at this time.  As Valoree progresses through  our weight loss program, we will gradually increase exercise as tolerated to treat her current condition.   If Veronnica follows our recommendations and loses 5-10% of their weight without improvement of her shortness of breath or if at any time, symptoms become more concerning, they agree to urgently follow up with their PCP/ specialist for further consideration/ evaluation.   Catalea verbalizes agreement with this plan.    Type 2 diabetes mellitus with morbid obesity (HCC) Assessment: Condition is Not at goal.. She was diagnosed with diabetes  5+ years ago. Sharleen's A1c is elevated at 6.8 as of 06/10/2016. She has not had her A1c or insulin level checked in years. She is currently on Ozempic for about a year now and feels as she has lost weight on this. She tolerates this well and denies any adverse side effects. She was previously on Metformin before being switched to Ozempic.  Lab Results  Component Value Date   HGBA1C 6.8 (H) 06/10/2016   HGBA1C 6.9 (H) 05/31/2016   HGBA1C 6.8 (H) 06/21/2013    Plan: - Continue Ozempic 0.5 once weekly as directed.    - Intensive lifestyle modification including diet, exercise and weight loss are the first line of treatment for diabetes. We extensively discussed the importance of decreasing simple carbs, increasing proteins and how certain foods they eat will affect their blood sugars  - Hypoglycemia prevention discussed with the patient.  Eat on a regular basis- no skipping or going long periods without eating.      - Recheck labs in 3 months if not done by PCP and importance of f/up with PCP and all other specialists, as scheduled, was stressed to the patient today.   Hypertension associated with diabetes (HCC) Assessment: Condition is Controlled. This is well controlled with Norvasc and Diovan-HCT daily. She has been on this medication for a while now and tolerates this well. She denies any adverse side effects on these medicines.  Last 3 blood pressure readings in our office are as follows: BP Readings from Last 3 Encounters:  03/19/23 132/76  02/12/23 132/85  09/09/22 129/85    Plan: - Continue with Diovan-HCT 320-25mg  daily and Norvasc 10mg  daily.   - [name] BP is 132/76 at goal today.   - Lifestyle changes such as following our low salt, heart healthy meal plan and engaging in a regular exercise program discussed. She is to avoid buying foods that are: processed, frozen, or prepackaged to avoid excess salt.  - Ambulatory blood pressure monitoring encouraged.  Reminded patient  that if they ever feel poorly in any way, to check their blood pressure and pulse as well.  - We will continue to monitor closely alongside PCP/ specialists.  Pt reminded to also f/up with those individuals as instructed by them.    Vitamin D deficiency Assessment: Condition is Not at goal.. She takes OTC vitamin D supplements and has been taking this for the past 2 years. She tolerates this well and denies any adverse side effects.  No results found for: "VD25OH"  Plan: - Continue OTC 5,000 vitamin D supplements.   - I discussed the importance of vitamin D to the patient's health and well-being as well as to their ability to lose weight.   - weight loss will likely improve availability of vitamin D, thus encouraged Oshea to continue with meal plan and their weight loss efforts to further improve this condition.  Thus, we will need to monitor levels regularly (every 3-4 mo on average) to  keep levels within normal limits and prevent over supplementation.   Chronic obstructive pulmonary disease, unspecified COPD type (HCC)- 40 pack yr history of smoking, quit 2014 Assessment: She was diagnosed with mild COPD this year and was prescribed albuterol and symbicort. She rarely has to use her inhaler.   Plan: - Continue to keep her albuterol and symbicort inhalers on standby in case of emergencies.    TREATMENT PLAN FOR OBESITY: Morbid obesity (HCC), Starting BMI 43.12 Assessment: Muscle mass is  127.8lb. Fat mass is 130.lb.Total body water is  90.6lb.   Plan:  Haliegh will work on healthier eating habits and try their best to follow the Category 1 meal plan with breakfast option best they can.   - I recommended Terrill Mohr delightful bread, Jomarie Longs wraps, and Lavash bread as healthy carb alternatives.   Behavioral Intervention Additional resources provided today: category 1 meal plan information and breakfast options Evidence-based interventions for health behavior change were utilized today  including the discussion of self monitoring techniques, problem-solving barriers and SMART goal setting techniques.   Regarding patient's less desirable eating habits and patterns, we employed the technique of small changes.  Pt will specifically work on: Begin Category 1 meal plan for next visit.    FOLLOW UP: Follow up in 2 weeks. She was informed of the importance of frequent follow up visits to maximize her success with intensive lifestyle modifications for her multiple health conditions.  [name] is aware that we will review all of her lab results at our next visit.  She is aware that if anything is critical/ life threatening with the results, we will be contacting her via MyChart prior to the office visit to discuss management.    Chief Complaint:   OBESITY KAIANA DIGIANDOMENICO (MR# [id number]) is a 72 y.o. female who presents for evaluation and treatment of obesity and related comorbidities. Current BMI is Body mass index is 42.77 kg/m. Vadis has been struggling with her weight for many years and has been unsuccessful in either losing weight, maintaining weight loss, or reaching her healthy weight goal.  [name] is currently in the action stage of change and ready to dedicate time achieving and maintaining a healthier weight. Gyselle is interested in becoming our patient and working on intensive lifestyle modifications including (but not limited to) diet and exercise for weight loss.  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 Patient is widowed since 2018 and has 2 children. She lives with alone.  Katlynd desires to be 175lbs in one year to decrease the number of medications, decrease joint/muscle pain, have more energy, and be able to enjoy different activities, playing with her children, and walk farther without frequent stops. She has never been on any weight loss programs but is on Ozempic. She goes out to eat and eats fast food about 3 times per week. She does her  grocery shopping but does not like to cook. Pt tends to crave breads, bacon, fried chicken wings, steak, pork chops, potatoes, cheese, chips, sausage. She denies being a picky eater but dislikes oatmeal, squash, and okra. She tends to snack on cheese, chips, candy, and cookies. Trenell skips breakfast often and drinks about 2 sodas a week, coffee with creamers, juice, and tea with honey and milk. She feels as she struggles with poor food choices, portion control, and and her worst food habits are eating fried foods, potatoes, and macaroni and cheese. Abrina endorses eating when bored or stressed and  feels as she over eats. She endorses since gaining weight it has decreased her interest in things she enjoys, increased her fatigue, and effects her mood.   Subjective:   This is the patient's first visit at [facility].  The patient's NEW PATIENT PACKET that they filled out prior to today's office visit was reviewed at length and information from that paperwork was included within the following office visit note.    Included in the packet: current and past health history, medications, allergies, ROS, gynecologic history (women only), surgical history, family history, social history, weight history, weight loss surgery history (for those that have had weight loss surgery), nutritional evaluation, mood and food questionnaire along with a depression screening (PHQ9) on all patients, an Epworth questionnaire, sleep habits questionnaire, patient life and health improvement goals questionnaire. These will all be scanned into the patient's chart under the "media" tab.   Review of Systems: Please refer to new patient packet scanned into media. Pertinent positives were addressed with patient today.  Reviewed by clinician on day of visit: allergies, medications, problem list, medical history, surgical history, family history, social history, and previous encounter notes.  During the visit, I independently  reviewed the patient's EKG, bioimpedance scale results, and indirect calorimeter results. I used this information to tailor a meal plan for the patient that will help [name] to lose weight and will improve her obesity-related conditions going forward.  I performed a medically necessary appropriate examination and/or evaluation. I discussed the assessment and treatment plan with the patient. The patient was provided an opportunity to ask questions and all were answered. The patient agreed with the plan and demonstrated an understanding of the instructions. Labs were ordered today (unless patient declined them) and will be reviewed with the patient at our next visit unless more critical results need to be addressed immediately. Clinical information was updated and documented in the EMR.   Objective:   PHYSICAL EXAM: Blood pressure 132/76, pulse 73, temperature 97.9 F (36.6 C), height 5\' 6"  (1.676 m), weight 265 lb (120.2 kg), SpO2 98%. Body mass index is 42.77 kg/m. General: Well Developed, well nourished, and in no acute distress.  HEENT: Normocephalic, atraumatic Skin: Warm and dry, cap RF less 2 sec, good turgor Chest:  Normal excursion, shape, no gross abn Respiratory: speaking in full sentences, no conversational dyspnea NeuroM-Sk: Ambulates w/o assistance, moves * 4 Psych: A and O *3, insight good, mood-full  Anthropometric Measurements Height: 5\' 6"  (1.676 m) Weight: 265 lb (120.2 kg) BMI (Calculated): 42.79 Weight at Last Visit: na Weight Lost Since Last Visit: na Weight Gained Since Last Visit: na Starting Weight: 265l Total Weight Loss (lbs): 0 lb (0 kg) Peak Weight: 295lb Waist Measurement : 47 inches   Body Composition  Body Fat %: 49.2 % Fat Mass (lbs): 130.4 lbs Muscle Mass (lbs): 127.8 lbs Total Body Water (lbs): 90.6 lbs Visceral Fat Rating : 18   Other Clinical Data Fasting: yes Labs: yes Today's Visit #: 1 Starting Date: 03/19/23 Comments: first  visit    DIAGNOSTIC DATA REVIEWED:  BMET    Component Value Date/Time   NA 137 01/03/2020 1007   K 4.2 01/03/2020 1007   CL 97 01/03/2020 1007   CO2 24 01/03/2020 1007   GLUCOSE 131 (H) 01/03/2020 1007   GLUCOSE 172 (H) 06/11/2016 0433   BUN 18 01/03/2020 1007   CREATININE 0.93 01/03/2020 1007   CALCIUM 9.7 01/03/2020 1007   GFRNONAA 64 01/03/2020 1007  GFRAA 74 01/03/2020 1007   Lab Results  Component Value Date   HGBA1C 6.8 (H) 06/10/2016   HGBA1C 6.8 (H) 06/21/2013   No results found for: "INSULIN" No results found for: "TSH" CBC    Component Value Date/Time   WBC 9.4 06/12/2016 0652   RBC 3.81 (L) 06/12/2016 0652   HGB 10.3 (L) 06/12/2016 0652   HCT 32.8 (L) 06/12/2016 0652   PLT 233 06/12/2016 0652   MCV 86.1 06/12/2016 0652   MCH 27.0 06/12/2016 0652   MCHC 31.4 06/12/2016 0652   RDW 14.4 06/12/2016 0652   Iron Studies No results found for: "IRON", "TIBC", "FERRITIN", "IRONPCTSAT" Lipid Panel  No results found for: "CHOL", "TRIG", "HDL", "CHOLHDL", "VLDL", "LDLCALC", "LDLDIRECT" Hepatic Function Panel     Component Value Date/Time   PROT 8.2 04/01/2014 1104   ALBUMIN 3.9 04/01/2014 1104   AST 19 04/01/2014 1104   ALT 19 04/01/2014 1104   ALKPHOS 101 04/01/2014 1104   BILITOT 0.3 04/01/2014 1104   No results found for: "TSH" Nutritional No results found for: "VD25OH"  Attestation Statements:   I, Clinical biochemist, acting as a Stage manager for [employer], DO., have compiled all relevant documentation for today's office visit on behalf of Thomasene Lot, DO, while in the presence of [employer], DO.  Time spent on visit including pre-visit chart review and post-visit care was estimated to be 60  minutes. Over 50% of the time was spent in direct face to face counseling and coordination of care.  I have reviewed the above documentation for accuracy and completeness, and I agree with the above. Carlye Grippe, D.O.  The 21st Century  Cures Act was signed into law in 2016 which includes the topic of electronic health records.  This provides immediate access to information in MyChart.  This includes consultation notes, operative notes, office notes, lab results and pathology reports.  If you have any questions about what you read please let us know at your next visit so we can discuss your concerns and take corrective action if need be.  We are right here with you.

## 2023-03-20 LAB — COMPREHENSIVE METABOLIC PANEL
ALT: 18 IU/L (ref 0–32)
AST: 18 IU/L (ref 0–40)
Albumin: 4.4 g/dL (ref 3.8–4.8)
Alkaline Phosphatase: 90 IU/L (ref 44–121)
BUN/Creatinine Ratio: 20 (ref 12–28)
BUN: 16 mg/dL (ref 8–27)
Bilirubin Total: 0.3 mg/dL (ref 0.0–1.2)
CO2: 27 mmol/L (ref 20–29)
Calcium: 9.4 mg/dL (ref 8.7–10.3)
Chloride: 101 mmol/L (ref 96–106)
Creatinine, Ser: 0.81 mg/dL (ref 0.57–1.00)
Globulin, Total: 3.1 g/dL (ref 1.5–4.5)
Glucose: 72 mg/dL (ref 70–99)
Potassium: 4 mmol/L (ref 3.5–5.2)
Sodium: 142 mmol/L (ref 134–144)
Total Protein: 7.5 g/dL (ref 6.0–8.5)
eGFR: 78 mL/min/{1.73_m2} (ref >59)

## 2023-03-20 LAB — CBC WITH DIFFERENTIAL/PLATELET
Basophils Absolute: 0 10*3/uL (ref 0.0–0.2)
Basos: 0 %
EOS (ABSOLUTE): 0.1 10*3/uL (ref 0.0–0.4)
Eos: 1 %
Hematocrit: 45.3 % (ref 34.0–46.6)
Hemoglobin: 14.9 g/dL (ref 11.1–15.9)
Immature Grans (Abs): 0 10*3/uL (ref 0.0–0.1)
Immature Granulocytes: 0 %
Lymphocytes Absolute: 3.4 10*3/uL — ABNORMAL HIGH (ref 0.7–3.1)
Lymphs: 37 %
MCH: 28.9 pg (ref 26.6–33.0)
MCHC: 32.9 g/dL (ref 31.5–35.7)
MCV: 88 fL (ref 79–97)
Monocytes Absolute: 0.7 10*3/uL (ref 0.1–0.9)
Monocytes: 8 %
Neutrophils Absolute: 5 10*3/uL (ref 1.4–7.0)
Neutrophils: 54 %
Platelets: 302 10*3/uL (ref 150–450)
RBC: 5.16 x10E6/uL (ref 3.77–5.28)
RDW: 13.6 % (ref 11.7–15.4)
WBC: 9.3 10*3/uL (ref 3.4–10.8)

## 2023-03-20 LAB — VITAMIN B12: Vitamin B-12: 1999 pg/mL — ABNORMAL HIGH (ref 232–1245)

## 2023-03-20 LAB — LIPID PANEL WITH LDL/HDL RATIO
Cholesterol, Total: 186 mg/dL (ref 100–199)
HDL: 68 mg/dL (ref >39)
LDL Chol Calc (NIH): 100 mg/dL — ABNORMAL HIGH (ref 0–99)
LDL/HDL Ratio: 1.5 ratio (ref 0.0–3.2)
Triglycerides: 99 mg/dL (ref 0–149)
VLDL Cholesterol Cal: 18 mg/dL (ref 5–40)

## 2023-03-20 LAB — HEMOGLOBIN A1C
Est. average glucose Bld gHb Est-mCnc: 128 mg/dL
Hgb A1c MFr Bld: 6.1 % — ABNORMAL HIGH (ref 4.8–5.6)

## 2023-03-20 LAB — INSULIN, RANDOM: INSULIN: 10.6 u[IU]/mL (ref 2.6–24.9)

## 2023-03-20 LAB — FOLATE: Folate: 14.2 ng/mL (ref >3.0)

## 2023-03-20 LAB — TSH: TSH: 1.84 u[IU]/mL (ref 0.450–4.500)

## 2023-03-20 LAB — T4, FREE: Free T4: 1.29 ng/dL (ref 0.82–1.77)

## 2023-03-20 LAB — VITAMIN D 25 HYDROXY (VIT D DEFICIENCY, FRACTURES): Vit D, 25-Hydroxy: 84.6 ng/mL (ref 30.0–100.0)

## 2023-03-31 ENCOUNTER — Ambulatory Visit (INDEPENDENT_AMBULATORY_CARE_PROVIDER_SITE_OTHER): Payer: Medicare Other | Admitting: Family Medicine

## 2023-03-31 ENCOUNTER — Encounter (INDEPENDENT_AMBULATORY_CARE_PROVIDER_SITE_OTHER): Payer: Self-pay | Admitting: Family Medicine

## 2023-03-31 VITALS — BP 143/83 | HR 89 | Temp 98.7°F | Ht 66.0 in | Wt 261.0 lb

## 2023-03-31 DIAGNOSIS — E785 Hyperlipidemia, unspecified: Secondary | ICD-10-CM

## 2023-03-31 DIAGNOSIS — E1169 Type 2 diabetes mellitus with other specified complication: Secondary | ICD-10-CM

## 2023-03-31 DIAGNOSIS — K5909 Other constipation: Secondary | ICD-10-CM

## 2023-03-31 DIAGNOSIS — E559 Vitamin D deficiency, unspecified: Secondary | ICD-10-CM | POA: Diagnosis not present

## 2023-03-31 DIAGNOSIS — E1159 Type 2 diabetes mellitus with other circulatory complications: Secondary | ICD-10-CM

## 2023-03-31 DIAGNOSIS — Z7985 Long-term (current) use of injectable non-insulin antidiabetic drugs: Secondary | ICD-10-CM

## 2023-03-31 DIAGNOSIS — Z6841 Body Mass Index (BMI) 40.0 and over, adult: Secondary | ICD-10-CM

## 2023-03-31 DIAGNOSIS — I152 Hypertension secondary to endocrine disorders: Secondary | ICD-10-CM

## 2023-03-31 MED ORDER — POLYETHYLENE GLYCOL 3350 17 G PO PACK: 17.0000 g | PACK | Freq: Two times a day (BID) | ORAL | 0 refills | Status: DC

## 2023-03-31 NOTE — Progress Notes (Signed)
Breanna Ross, D.O.  ABFM, ABOM Clinical Bariatric Medicine Physician  Office located at: [address] [place], [place]  [address]     Assessment and Plan:   Medications Discontinued During This Encounter  Medication Reason   Budeson-Glycopyrrol-Formoterol (BREZTRI AEROSPHERE) 160-9-4.8 MCG/ACT AERO    predniSONE (STERAPRED UNI-PAK 48 TAB) 5 MG (48) TBPK tablet    Meds ordered this encounter  Medications   polyethylene glycol (MIRALAX / GLYCOLAX) 17 g packet    Sig: Take 17 g by mouth 2 (two) times daily. Until stooling regularly, then once daily or PRN    Dispense:  60 packet    Refill:  0    Vitamin D deficiency Assessment: Condition is At goal.. Patients vitamin D level is within the recommended range. This is being well controled with OTC oral vitamin D supplements 5000lU. Her B-12 level is very elevated and she takes a B-12 supplement every other day. Her folate level is stable.  Lab Results  Component Value Date   VD25OH 84.6 03/19/2023   Component Ref Range & Units 03/19/2023  Folate >3.0 ng/mL 14.2     Lab Results  Component Value Date   VITAMINB12 1,999 (H) 03/19/2023   Plan: - Continue OTC oral vitamin D supplement every other day.   - Take one tablet a B-12 supplement every 3 days.   - ideal vitamin D levels reviewed with patient    - weight loss will likely improve availability of vitamin D. Her vitamin D level is likely to go down during the winter. Thus, we will need to monitor levels regularly (every 3-4 mo on average) to keep levels within normal limits and prevent over supplementation.  - Labs were reviewed with patient today and education provided on them. We discussed how the foods patient eats may influence these laboratory findings.  All of the patient's questions about them were answered    Type 2 diabetes mellitus with morbid obesity (HCC) Assessment: Condition is Not at goal.. Her A1c level is elevated at 6.1 but improved from 6.8  in 2017 and her insulin level is elevated at 10.6 on 03/19/2023. Her CBC, CMP, TSH, and T4 are normal. She endorses having hunger and cravings. She continues Ozempic and denies any adverse side effects.  Lab Results  Component Value Date   HGBA1C 6.1 (H) 03/19/2023   HGBA1C 6.8 (H) 06/10/2016   HGBA1C 6.9 (H) 05/31/2016   INSULIN 10.6 03/19/2023      Component Value Date/Time   WBC 9.3 03/19/2023 1103   WBC 9.4 06/12/2016 0652   RBC 5.16 03/19/2023 1103   RBC 3.81 (L) 06/12/2016 0652   HGB 14.9 03/19/2023 1103   HCT 45.3 03/19/2023 1103   PLT 302 03/19/2023 1103   MCV 88 03/19/2023 1103   MCH 28.9 03/19/2023 1103   MCH 27.0 06/12/2016 0652   MCHC 32.9 03/19/2023 1103   MCHC 31.4 06/12/2016 0652   RDW 13.6 03/19/2023 1103   LYMPHSABS 3.4 (H) 03/19/2023 1103   MONOABS 0.6 05/31/2016 1441   EOSABS 0.1 03/19/2023 1103   BASOSABS 0.0 03/19/2023 1103   Lab Results  Component Value Date   CREATININE 0.81 03/19/2023   BUN 16 03/19/2023   NA 142 03/19/2023   K 4.0 03/19/2023   CL 101 03/19/2023   CO2 27 03/19/2023      Component Value Date/Time   PROT 7.5 03/19/2023 1103   ALBUMIN 4.4 03/19/2023 1103   AST 18 03/19/2023 1103   ALT  18 03/19/2023 1103   ALKPHOS 90 03/19/2023 1103   BILITOT 0.3 03/19/2023 1103    Lab Results  Component Value Date   TSH 1.840 03/19/2023   Component Ref Range & Units 03/19/2023  Free T4 0.82 - 1.77 ng/dL 1.61   Plan: -  Continue Ozempic as directed.   - Continue her prudent nutritional plan that is low in simple carbohydrates, saturated fats and trans fats to goal of 5-10% weight loss to achieve significant health benefits.  Pt encouraged to continually advance exercise and cardiovascular fitness as tolerated throughout weight loss journey.  - I informed her about how carbs and simple sugars can have a influence on her cravings.   - Importance of f/up with PCP and all other specialists, as scheduled, was stressed to the patient  today.  Labs were reviewed with patient today and education provided on them. We discussed how the foods patient eats may influence these laboratory findings.  All of the patient's questions about them were answered    Hypertension associated with diabetes [facility]) Assessment: Condition is Not at goal.. Her BP is elevated today at 143/83. Her last 2 readings were stable. This is controlled with Diovan-HCT 320-25mg  daily and Norvasc 10mg  daily.  Last 3 blood pressure readings in our office are as follows: BP Readings from Last 3 Encounters:  03/31/23 (!) 143/83  03/19/23 132/76  02/12/23 132/85   Plan: - Continue Norvasc and Diovan-HCT as directed.   - Continue to follow our low salt heart healthy meal plan and continue to avoid eating foods high in sodium. Continue to increase her water intake.  - Engage in a regular exercise program discussed.  - Ambulatory blood pressure monitoring encouraged.  Reminded patient that if they ever feel poorly in any way, to check their blood pressure and pulse as well.  - We will continue to monitor closely alongside PCP/ specialists as they relate to the her weight loss journey.  Labs were reviewed with patient today and education provided on them. We discussed how the foods patient eats may influence these laboratory findings.  All of the patient's questions about them were answered    Other constipation Assessment: Condition is Not optimized.. She endorses being constipated although drinking 4 glasses of 16oz of water. She informed me that eating a pear produces a bowel movement fast.   Plan: - On days she feels constipated I recommended that she take MiraLAX.    Hyperlipidemia associated with type 2 diabetes mellitus (HCC) Assessment: Condition is Not at goal.. Her LDL is elevated at 100 as of 03/19/2023. Her HDL is within a good range at 68. This is being treated with Welchol, Lovaza, and CO Q 10 PO. Lab Results  Component Value Date   CHOL 186  03/19/2023   HDL 68 03/19/2023   LDLCALC 100 (H) 03/19/2023   TRIG 99 03/19/2023   Plan:  - Continue with Welchol 625mg  BID, Lovaza 1g BID, and CO Q 10 PO daily.  - ASHARIA BOLING agrees to continue with meds and/or our treatment plan of a heart-heathy, low cholesterol meal plan  - Cardiovascular risk and specific lipid/LDL goals reviewed.  - I stressed the importance that patient continue with our prudent nutritional plan that is low in saturated and trans fats, and low in fatty carbs to improve these numbers.   - We will continue routine screening as patient continues to achieve health goals along their weight loss journey  Labs were reviewed with patient today and  education provided on them. We discussed how the foods patient eats may influence these laboratory findings.  All of the patient's questions about them were answered    TREATMENT PLAN FOR OBESITY: Morbid obesity (HCC), Starting BMI 43.12 Assessment:  Breanna Ross is here to discuss her progress with her obesity treatment plan along with follow-up of her obesity related diagnoses. See Medical Weight Management Flowsheet for complete bioelectrical impedance results.  Condition is not optimized. Biometric data collected today, was reviewed with patient.   Since last office visit patient's  Muscle mass has decreased by 1.8lb. Fat mass has decreased by 1.4b. Total body water has increased by 1.2lb.  Counseling done on how various foods will affect these numbers and how to maximize success  Total lbs lost to date: 4 Total weight loss percentage to date: 1.51%   Plan:  Continue to follow the Continue Category 1 meal plan with breakfast options  the best they can.  - Unless pre-existing renal or cardiopulmonary conditions exist which patient was told to limit their fluid intake by another provider, I recommended roughly one half of their weight in pounds, to be the approximate ounces of non-caloric, non-caffeinated beverages  they should drink per day; including more if they are engaging in exercise.    Behavioral Intervention Additional resources provided today: patient declined Evidence-based interventions for health behavior change were utilized today including the discussion of self monitoring techniques, problem-solving barriers and SMART goal setting techniques.   Regarding patient's less desirable eating habits and patterns, we employed the technique of small changes.  Pt will specifically work on: follow the meal plan more consistently  for next visit.   FOLLOW UP: Return in about 3 weeks (around 04/21/2023). She was informed of the importance of frequent follow up visits to maximize her success with intensive lifestyle modifications for her multiple health conditions.  Subjective:   Chief complaint: Obesity Breanna Ross is here to discuss her progress with her obesity treatment plan. She is on the the Category 1 Plan with breakfast options and states she is following her eating plan approximately 75 % of the time. She states she is not exercising.   Interval History:  Breanna Ross is here today for her first follow-up office visit since starting the program with [place].  Since last office visit she has been well but feels as she is having withdrawals from food. She endorses having an headache.  She was previously on Prednisone. She noticed having more energy and the swelling in her feet has gone down. She states that she stayed on plan but added in extra food. She notes eating BBQ pork, sausage dogs, and baked beans during and after labor day. 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 This makes her sleepy and nauseous occasionally. She endorses being constipated but drinks about 4 16oz of water.   All blood work/ lab tests that were recently ordered by myself or an outside provider were reviewed with patient today per their request. Extended time was spent counseling her on  all new disease processes that were discovered or preexisting ones that are affected by BMI.  she understands that many of these abnormalities will need to monitored regularly along with the current treatment plan of prudent dietary changes, in which we are making each and every office visit, to improve these health parameters.  Pharmacotherapy for weight loss: She is currently taking  Ozempic  for medical weight loss.  Denies side effects.    Review of Systems:  Pertinent positives were addressed with patient today.  Reviewed by clinician on day of visit: allergies, medications, problem list, medical history, surgical history, family history, social history, and previous encounter notes.  Weight Summary and Biometrics   Weight Lost Since Last Visit: 4lb  Weight Gained Since Last Visit: 0lb   Vitals Temp: 98.7 F (37.1 C) BP: (!) 143/83 Pulse Rate: 89 SpO2: 97 %   Anthropometric Measurements Height: 5\' 6"  (1.676 m) Weight: 261 lb (118.4 kg) BMI (Calculated): 42.15 Weight at Last Visit: 265lb Weight Lost Since Last Visit: 4lb Weight Gained Since Last Visit: 0lb Starting Weight: 265lb Total Weight Loss (lbs): 4 lb (1.814 kg) Peak Weight: 295lb   Body Composition  Body Fat %: 49.3 % Fat Mass (lbs): 129 lbs Muscle Mass (lbs): 126 lbs Total Body Water (lbs): 91.8 lbs Visceral Fat Rating : 18   Other Clinical Data Fasting: no Labs: no Today's Visit #: 2 Starting Date: 03/19/23     Objective:   PHYSICAL EXAM:  Blood pressure (!) 143/83, pulse 89, temperature 98.7 F (37.1 C), height 5\' 6"  (1.676 m), weight 261 lb (118.4 kg), SpO2 97%. Body mass index is 42.13 kg/m.  General: Well Developed, well nourished, and in no acute distress.  HEENT: Normocephalic, atraumatic Skin: Warm and dry, cap RF less 2 sec, good turgor Chest:  Normal excursion, shape, no gross abn Respiratory: speaking in full sentences, no conversational dyspnea NeuroM-Sk: Ambulates w/o  assistance, moves * 4 Psych: A and O *3, insight good, mood-full  DIAGNOSTIC DATA REVIEWED:  BMET    Component Value Date/Time   NA 142 03/19/2023 1103   K 4.0 03/19/2023 1103   CL 101 03/19/2023 1103   CO2 27 03/19/2023 1103   GLUCOSE 72 03/19/2023 1103   GLUCOSE 172 (H) 06/11/2016 0433   BUN 16 03/19/2023 1103   CREATININE 0.81 03/19/2023 1103   CALCIUM 9.4 03/19/2023 1103   GFRNONAA 64 01/03/2020 1007   GFRAA 74 01/03/2020 1007   Lab Results  Component Value Date   HGBA1C 6.1 (H) 03/19/2023   HGBA1C 6.8 (H) 06/21/2013   Lab Results  Component Value Date   INSULIN 10.6 03/19/2023   Lab Results  Component Value Date   TSH 1.840 03/19/2023   CBC    Component Value Date/Time   WBC 9.3 03/19/2023 1103   WBC 9.4 06/12/2016 0652   RBC 5.16 03/19/2023 1103   RBC 3.81 (L) 06/12/2016 0652   HGB 14.9 03/19/2023 1103   HCT 45.3 03/19/2023 1103   PLT 302 03/19/2023 1103   MCV 88 03/19/2023 1103   MCH 28.9 03/19/2023 1103   MCH 27.0 06/12/2016 0652   MCHC 32.9 03/19/2023 1103   MCHC 31.4 06/12/2016 0652   RDW 13.6 03/19/2023 1103   Iron Studies No results found for: "IRON", "TIBC", "FERRITIN", "IRONPCTSAT" Lipid Panel     Component Value Date/Time   CHOL 186 03/19/2023 1103   TRIG 99 03/19/2023 1103   HDL 68 03/19/2023 1103   LDLCALC 100 (H) 03/19/2023 1103   Hepatic Function Panel     Component Value Date/Time   PROT 7.5 03/19/2023 1103   ALBUMIN 4.4 03/19/2023 1103   AST 18 03/19/2023 1103   ALT 18 03/19/2023 1103   ALKPHOS 90 03/19/2023 1103   BILITOT 0.3 03/19/2023 1103      Component Value Date/Time   TSH 1.840 03/19/2023 1103   Nutritional Lab Results  Component Value Date  VD25OH 84.6 03/19/2023    Attestations:   Reviewed by clinician on day of visit: allergies, medications, problem list, medical history, surgical history, family history, social history, and previous encounter notes.   Patient was in the office today and time spent on  visit including pre-visit chart review and post-visit care/coordination of care and electronic medical record documentation was 49 minutes. 50% of the time was in face to face counseling of this patient's medical condition(s) and providing education on treatment options to include the first-line treatment of diet and lifestyle modification.  I, Clinical biochemist, acting as a Stage manager for [employer], DO., have compiled all relevant documentation for today's office visit on behalf of Thomasene Lot, DO, while in the presence of [employer], DO.  I have reviewed the above documentation for accuracy and completeness, and I agree with the above. Breanna Ross, D.O.  The 21st Century Cures Act was signed into law in 2016 which includes the topic of electronic health records.  This provides immediate access to information in MyChart.  This includes consultation notes, operative notes, office notes, lab results and pathology reports.  If you have any questions about what you read please let us know at your next visit so we can discuss your concerns and take corrective action if need be.  We are right here with you.

## 2023-04-24 ENCOUNTER — Encounter (INDEPENDENT_AMBULATORY_CARE_PROVIDER_SITE_OTHER): Payer: Self-pay | Admitting: Family Medicine

## 2023-04-24 ENCOUNTER — Ambulatory Visit (INDEPENDENT_AMBULATORY_CARE_PROVIDER_SITE_OTHER): Payer: Medicare Other | Admitting: Family Medicine

## 2023-04-24 VITALS — BP 130/78 | HR 85 | Temp 98.7°F | Ht 66.0 in | Wt 255.0 lb

## 2023-04-24 DIAGNOSIS — E1159 Type 2 diabetes mellitus with other circulatory complications: Secondary | ICD-10-CM

## 2023-04-24 DIAGNOSIS — E559 Vitamin D deficiency, unspecified: Secondary | ICD-10-CM

## 2023-04-24 DIAGNOSIS — Z6841 Body Mass Index (BMI) 40.0 and over, adult: Secondary | ICD-10-CM

## 2023-04-24 DIAGNOSIS — E1169 Type 2 diabetes mellitus with other specified complication: Secondary | ICD-10-CM | POA: Diagnosis not present

## 2023-04-24 DIAGNOSIS — I152 Hypertension secondary to endocrine disorders: Secondary | ICD-10-CM | POA: Diagnosis not present

## 2023-04-24 DIAGNOSIS — Z7985 Long-term (current) use of injectable non-insulin antidiabetic drugs: Secondary | ICD-10-CM

## 2023-04-24 NOTE — Progress Notes (Signed)
Breanna Ross, D.O.  ABFM, ABOM Specializing in Clinical Bariatric Medicine  Office located at: [address] [place], [place]  [address]     Assessment and Plan:   Type 2 diabetes mellitus with morbid obesity [facility]) Assessment & Plan: Hemoglobin A1c & Fasting insulin levels on 03/19/23 were 6.1 & 10.6 respectively. T2DM treated with Ozempic 0.5 mg once wkly. Pt tolerating Ozempic well without any adverse effects. Denies having any symptoms of low/highs. Continue Ozempic per PCP/specialist. Continue to decrease simple carbs/ sugars; increase fiber and proteins -> in other words, follow her meal plan.     Hypertension associated with diabetes [facility]) Assessment & Plan: BP Readings from Last 3 Encounters:  04/24/23 130/78  03/31/23 (!) 143/83  03/19/23 132/76   HTN treated with Norvasc & Diovan-hydrochlorothiazide. Blood pressure stable and has improved since last office visit. C/w Prudent nutritional plan and low sodium diet, advance exercise as tolerated. Maintain with all antihypertensives per PCP/specialist.    Vitamin D deficiency Assessment & Plan: Vitamin D levels were 84.6 roughly 1 mo ago. Currently on Cholecalciferol 5,000 units once daily - previously was taking twice daily.  C/w their weight loss efforts and OTC Vitamin D.   Morbid obesity (HCC), Starting BMI 43.12 Assessment & Plan: Since last office visit on 03/31/23 patient's  Muscle mass has decreased by 3.4 lb. Fat mass has decreased by 3 lb. Total body water has decreased by 0.4 lb.  Counseling done on how various foods will affect these numbers and how to maximize success  Total lbs lost to date: 10 lbs  Total weight loss percentage to date: 3.77%  Meal plan: Category 1 MP with B & L options.    I recommended pt to make foods that she loves in a high protein, low fat/carb/calorie manner.   Suggested pt to try Better than Bouillon Roasted Chicken  Reminded pt to have 4-6 oz of lean protein at lunch &  6-8 oz for dinner.   Behavioral Intervention Additional resources provided today: category 1 meal plan information, lunch options, recipe packet #1, and home-made seasoning recipe  Evidence-based interventions for health behavior change were utilized today including the discussion of self monitoring techniques, problem-solving barriers and SMART goal setting techniques.   Regarding patient's less desirable eating habits and patterns, we employed the technique of small changes.  Pt will specifically work on: getting in all her lean protein for next visit.    FOLLOW UP: Return 05/08/23. She was informed of the importance of frequent follow up visits to maximize her success with intensive lifestyle modifications for her multiple health conditions.  Subjective:   Chief complaint: Obesity [name] is here to discuss her progress with her obesity treatment plan. She is on the Category 1 Plan with B options and states she is following her eating plan approximately 90% of the time. She states she is not exercising.   Interval History:  [name] is here for a follow up office visit. Since last OV, [name] has been doing well. Reports having to eat soft foods like chicken noodle soup for 4 days or so because she had a back tooth extracted. In general, pt reports eating 4 ounces of lean protein at lunch & 4 ounces at dinner. Reports being slightly bored with the meal plan and requests for some new recipe ideas.   Pharmacotherapy for weight loss: She is currently taking  Ozempic 0.5 mg once a week  for medical weight loss.  Denies side  effects.    Review of Systems:  Pertinent positives were addressed with patient today.  Reviewed by clinician on day of visit: allergies, medications, problem list, medical history, surgical history, family history, social history, and previous encounter notes.  Weight Summary and Biometrics   Weight Lost Since Last Visit: 6lb  Weight Gained Since Last Visit: 0lb    Vitals Temp: 98.7 F (37.1 C) BP: 130/78 Pulse Rate: 85 SpO2: 99 %   Anthropometric Measurements Height: 5\' 6"  (1.676 m) Weight: 255 lb (115.7 kg) BMI (Calculated): 41.18 Weight at Last Visit: 261lb Weight Lost Since Last Visit: 6lb Weight Gained Since Last Visit: 0lb Starting Weight: 265lb Total Weight Loss (lbs): 10 lb (4.536 kg) Peak Weight: 295lb   Body Composition  Body Fat %: 49.4 % Fat Mass (lbs): 126 lbs Muscle Mass (lbs): 122.6 lbs Total Body Water (lbs): 91.4 lbs Visceral Fat Rating : 17   Other Clinical Data Fasting: no Labs: no Today's Visit #: 3 Starting Date: 03/19/23   Objective:   PHYSICAL EXAM: Blood pressure 130/78, pulse 85, temperature 98.7 F (37.1 C), height 5\' 6"  (1.676 m), weight 255 lb (115.7 kg), SpO2 99%. Body mass index is 41.16 kg/m.  General: Well Developed, well nourished, and in no acute distress.  HEENT: Normocephalic, atraumatic Skin: Warm and dry, cap RF less 2 sec, good turgor Chest:  Normal excursion, shape, no gross abn Respiratory: speaking in full sentences, no conversational dyspnea NeuroM-Sk: Ambulates w/o assistance, moves * 4 Psych: A and O *3, insight good, mood-full  DIAGNOSTIC DATA REVIEWED:  BMET    Component Value Date/Time   NA 142 03/19/2023 1103   K 4.0 03/19/2023 1103   CL 101 03/19/2023 1103   CO2 27 03/19/2023 1103   GLUCOSE 72 03/19/2023 1103   GLUCOSE 172 (H) 06/11/2016 0433   BUN 16 03/19/2023 1103   CREATININE 0.81 03/19/2023 1103   CALCIUM 9.4 03/19/2023 1103   GFRNONAA 64 01/03/2020 1007   GFRAA 74 01/03/2020 1007   Lab Results  Component Value Date   HGBA1C 6.1 (H) 03/19/2023   HGBA1C 6.8 (H) 06/21/2013   Lab Results  Component Value Date   INSULIN 10.6 03/19/2023   Lab Results  Component Value Date   TSH 1.840 03/19/2023   CBC    Component Value Date/Time   WBC 9.3 03/19/2023 1103   WBC 9.4 06/12/2016 0652   RBC 5.16 03/19/2023 1103   RBC 3.81 (L) 06/12/2016 0652    HGB 14.9 03/19/2023 1103   HCT 45.3 03/19/2023 1103   PLT 302 03/19/2023 1103   MCV 88 03/19/2023 1103   MCH 28.9 03/19/2023 1103   MCH 27.0 06/12/2016 0652   MCHC 32.9 03/19/2023 1103   MCHC 31.4 06/12/2016 0652   RDW 13.6 03/19/2023 1103   Iron Studies No results found for: "IRON", "TIBC", "FERRITIN", "IRONPCTSAT" Lipid Panel     Component Value Date/Time   CHOL 186 03/19/2023 1103   TRIG 99 03/19/2023 1103   HDL 68 03/19/2023 1103   LDLCALC 100 (H) 03/19/2023 1103   Hepatic Function Panel     Component Value Date/Time   PROT 7.5 03/19/2023 1103   ALBUMIN 4.4 03/19/2023 1103   AST 18 03/19/2023 1103   ALT 18 03/19/2023 1103   ALKPHOS 90 03/19/2023 1103   BILITOT 0.3 03/19/2023 1103      Component Value Date/Time   TSH 1.840 03/19/2023 1103   Nutritional Lab Results  Component Value Date   VD25OH 84.6 03/19/2023  Attestations:   I, Special Puri, acting as a Stage manager for [employer], DO., have compiled all relevant documentation for today's office visit on behalf of Thomasene Lot, DO, while in the presence of [employer], DO.  I have reviewed the above documentation for accuracy and completeness, and I agree with the above. Breanna Ross, D.O.  The 21st Century Cures Act was signed into law in 2016 which includes the topic of electronic health records.  This provides immediate access to information in MyChart.  This includes consultation notes, operative notes, office notes, lab results and pathology reports.  If you have any questions about what you read please let us know at your next visit so we can discuss your concerns and take corrective action if need be.  We are right here with you.

## 2023-05-08 ENCOUNTER — Ambulatory Visit (INDEPENDENT_AMBULATORY_CARE_PROVIDER_SITE_OTHER): Payer: Medicare Other | Admitting: Family Medicine

## 2023-05-08 ENCOUNTER — Encounter (INDEPENDENT_AMBULATORY_CARE_PROVIDER_SITE_OTHER): Payer: Self-pay | Admitting: Family Medicine

## 2023-05-08 VITALS — BP 142/83 | HR 88 | Temp 98.1°F | Ht 66.0 in | Wt 253.0 lb

## 2023-05-08 DIAGNOSIS — E1169 Type 2 diabetes mellitus with other specified complication: Secondary | ICD-10-CM

## 2023-05-08 DIAGNOSIS — R42 Dizziness and giddiness: Secondary | ICD-10-CM | POA: Diagnosis not present

## 2023-05-08 DIAGNOSIS — I152 Hypertension secondary to endocrine disorders: Secondary | ICD-10-CM | POA: Diagnosis not present

## 2023-05-08 DIAGNOSIS — Z7985 Long-term (current) use of injectable non-insulin antidiabetic drugs: Secondary | ICD-10-CM

## 2023-05-08 DIAGNOSIS — Z6841 Body Mass Index (BMI) 40.0 and over, adult: Secondary | ICD-10-CM

## 2023-05-08 DIAGNOSIS — E1159 Type 2 diabetes mellitus with other circulatory complications: Secondary | ICD-10-CM | POA: Diagnosis not present

## 2023-05-08 NOTE — Progress Notes (Signed)
Breanna Ross, D.O.  ABFM, ABOM Specializing in Clinical Bariatric Medicine  Office located at: [address] [place], [place]  [address]   Assessment and Plan:   Hypertension associated with diabetes [facility]) Assessment & Plan: Last 3 blood pressure readings in our office are as follows: BP Readings from Last 3 Encounters:  05/08/23 (!) 142/83  04/24/23 130/78  03/31/23 (!) 143/83   HTN treated with Amplodipine & Valsatran-Hydrocholorthiazide. Blood pressure stable. Pt does not monitor blood pressure at home. Pt recommended to check bp 2-3 times a week at home. Continue with Prudent nutritional plan and low sodium diet, advance exercise as tolerated. Continue with all antihypertensives per PCP and or specialist.    Type 2 diabetes mellitus with morbid obesity (HCC) Assessment & Plan: Most recent Hemoglobin A1c & Fasting insulin levels were 6.1 & 10.6 respectively on 03/19/23. Diabetes mellitus treated with Ozempic 0.5 mg once weekly. Pt does not check blood sugars at home; denies any symptoms of highs/lows. Hunger and cravings are stable. Continue Ozempic per PCP/specialist. Continue with weight loss therapy.    Dizziness Assessment & Plan: Pt endorses having ongoing dizziness. She reports that the dizziness  occurs first thing in the morning when she rolls over and gets up from her bed. She does not experience dizziness with quick head movements (e.g when driving). Breanna Ross reports that she meet with PCP last week and was prescribed Antivert. She states that symptoms are stable, but not improving.  Pt drinks roughly 5 bottles of water daily.   I recommended the patient to drink approximately half of their body weight in ounces of water daily. Additionally, have an extra bottle of water for every 30 minutes of exercise. Continue with Antivert per PCP and f/up with them if symptoms worsen. Will continue to monitor condition.    Morbid obesity (HCC), Starting BMI 43.12 Assessment  & Plan: Since last office visit on 04/24/23 patient's muscle mass has increased by 0.6 lb. Fat mass has decreased by 2.2 lb. Total body water has increased by 0.6 lb.  Counseling done on how various foods will affect these numbers and how to maximize success  Total lbs lost to date: 12 lbs  Total weight loss percentage to date: 4.53%   Meal plan modified today: Cat 1 MP with B & L options + begin journaling breakfast 200 calories and 15+ grams protein.   Behavioral Intervention Additional resources provided today: n/a Evidence-based interventions for health behavior change were utilized today including the discussion of self monitoring techniques, problem-solving barriers and SMART goal setting techniques.   Regarding patient's less desirable eating habits and patterns, we employed the technique of small changes.  Pt will specifically work on: doing Walk at Home series 10-15 minutes, 3 days a week.   FOLLOW UP: Return 05/28/23. She was informed of the importance of frequent follow up visits to maximize her success with intensive lifestyle modifications for her multiple health conditions.  Subjective:   Chief complaint: Obesity Breanna Ross is here to discuss her progress with her obesity treatment plan. She is on the Category 1 MP with B & L options and states she is following her eating plan approximately 90-100% of the time. She states she is not exercising.  Interval History:  Breanna Ross is here for a follow up office visit. For breakfast, patient typically has [employer] protein waffles and sugar free syrup. At lunch, she reports getting in 6 ounces of lean protein. Pt has a list of  foods today and wants to know if they are okay too eat. Will review this with pt. No c/o hunger and cravings.   Pharmacotherapy for weight loss: She is currently taking  Ozempic 0.5 mg once a week  for medical weight loss.  Denies side effects.    Review of Systems:  Pertinent positives were addressed with  patient today.  Reviewed by clinician on day of visit: allergies, medications, problem list, medical history, surgical history, family history, social history, and previous encounter notes.  Weight Summary and Biometrics   Weight Lost Since Last Visit: 2lb  Weight Gained Since Last Visit: 0lb   Vitals Temp: 98.1 F (36.7 C) BP: (!) 142/83 Pulse Rate: 88 SpO2: 95 %   Anthropometric Measurements Height: 5\' 6"  (1.676 m) Weight: 253 lb (114.8 kg) BMI (Calculated): 40.85 Weight at Last Visit: 255lb Weight Lost Since Last Visit: 2lb Weight Gained Since Last Visit: 0lb Starting Weight: 265lb Total Weight Loss (lbs): 12 lb (5.443 kg) Peak Weight: 295lb   Body Composition  Body Fat %: 48.8 % Fat Mass (lbs): 123.8 lbs Muscle Mass (lbs): 123.2 lbs Total Body Water (lbs): 92 lbs Visceral Fat Rating : 17   Other Clinical Data Fasting: no Labs: no Today's Visit #: 4 Starting Date: 03/19/23   Objective:   PHYSICAL EXAM: Blood pressure (!) 142/83, pulse 88, temperature 98.1 F (36.7 C), height 5\' 6"  (1.676 m), weight 253 lb (114.8 kg), SpO2 95%. Body mass index is 40.84 kg/m.  General: Well Developed, well nourished, and in no acute distress.  HEENT: Normocephalic, atraumatic Skin: Warm and dry, cap RF less 2 sec, good turgor Chest:  Normal excursion, shape, no gross abn Respiratory: speaking in full sentences, no conversational dyspnea NeuroM-Sk: Ambulates w/o assistance, moves * 4 Psych: A and O *3, insight good, mood-full  DIAGNOSTIC DATA REVIEWED:  BMET    Component Value Date/Time   NA 142 03/19/2023 1103   K 4.0 03/19/2023 1103   CL 101 03/19/2023 1103   CO2 27 03/19/2023 1103   GLUCOSE 72 03/19/2023 1103   GLUCOSE 172 (H) 06/11/2016 0433   BUN 16 03/19/2023 1103   CREATININE 0.81 03/19/2023 1103   CALCIUM 9.4 03/19/2023 1103   GFRNONAA 64 01/03/2020 1007   GFRAA 74 01/03/2020 1007   Lab Results  Component Value Date   HGBA1C 6.1 (H) 03/19/2023    HGBA1C 6.8 (H) 06/21/2013   Lab Results  Component Value Date   INSULIN 10.6 03/19/2023   Lab Results  Component Value Date   TSH 1.840 03/19/2023   CBC    Component Value Date/Time   WBC 9.3 03/19/2023 1103   WBC 9.4 06/12/2016 0652   RBC 5.16 03/19/2023 1103   RBC 3.81 (L) 06/12/2016 0652   HGB 14.9 03/19/2023 1103   HCT 45.3 03/19/2023 1103   PLT 302 03/19/2023 1103   MCV 88 03/19/2023 1103   MCH 28.9 03/19/2023 1103   MCH 27.0 06/12/2016 0652   MCHC 32.9 03/19/2023 1103   MCHC 31.4 06/12/2016 0652   RDW 13.6 03/19/2023 1103   Iron Studies No results found for: "IRON", "TIBC", "FERRITIN", "IRONPCTSAT" Lipid Panel     Component Value Date/Time   CHOL 186 03/19/2023 1103   TRIG 99 03/19/2023 1103   HDL 68 03/19/2023 1103   LDLCALC 100 (H) 03/19/2023 1103   Hepatic Function Panel     Component Value Date/Time   PROT 7.5 03/19/2023 1103   ALBUMIN 4.4 03/19/2023 1103   AST 18  03/19/2023 1103   ALT 18 03/19/2023 1103   ALKPHOS 90 03/19/2023 1103   BILITOT 0.3 03/19/2023 1103      Component Value Date/Time   TSH 1.840 03/19/2023 1103   Nutritional Lab Results  Component Value Date   VD25OH 84.6 03/19/2023    Attestations:   I, Special Puri, acting as a Stage manager for [employer], DO., have compiled all relevant documentation for today's office visit on behalf of Thomasene Lot, DO, while in the presence of [employer], DO.  I have reviewed the above documentation for accuracy and completeness, and I agree with the above. Breanna Ross, D.O.  The 21st Century Cures Act was signed into law in 2016 which includes the topic of electronic health records.  This provides immediate access to information in MyChart.  This includes consultation notes, operative notes, office notes, lab results and pathology reports.  If you have any questions about what you read please let us know at your next visit so we can discuss your concerns and take corrective  action if need be.  We are right here with you.

## 2023-05-12 LAB — BASIC METABOLIC PANEL
BUN: 17 (ref 4–21)
CO2: 26 — AB (ref 13–22)
Chloride: 101 (ref 99–108)
Creatinine: 0.8 (ref 0.5–1.1)
Glucose: 79
Potassium: 3.9 meq/L (ref 3.5–5.1)
Sodium: 142 (ref 137–147)

## 2023-05-12 LAB — CBC AND DIFFERENTIAL
HCT: 46 (ref 36–46)
Hemoglobin: 15 (ref 12.0–16.0)
Platelets: 297 10*3/uL (ref 150–400)
WBC: 5.8

## 2023-05-12 LAB — HEPATIC FUNCTION PANEL
ALT: 23 U/L (ref 7–35)
AST: 19 (ref 13–35)
Alkaline Phosphatase: 81 (ref 25–125)
Bilirubin, Total: 0.3

## 2023-05-12 LAB — CBC: RBC: 5.21 — AB (ref 3.87–5.11)

## 2023-05-12 LAB — COMPREHENSIVE METABOLIC PANEL
Albumin: 4.4 (ref 3.5–5.0)
Calcium: 9.6 (ref 8.7–10.7)
Globulin: 3
eGFR: 76

## 2023-05-12 LAB — VITAMIN D 25 HYDROXY (VIT D DEFICIENCY, FRACTURES): Vit D, 25-Hydroxy: 115

## 2023-05-12 LAB — LIPID PANEL
Cholesterol: 181 (ref 0–200)
HDL: 54 (ref 35–70)
LDL Cholesterol: 108
Triglycerides: 104 (ref 40–160)

## 2023-05-12 LAB — HEMOGLOBIN A1C: Hemoglobin A1C: 5.7

## 2023-05-15 ENCOUNTER — Ambulatory Visit (INDEPENDENT_AMBULATORY_CARE_PROVIDER_SITE_OTHER): Payer: Medicare Other | Admitting: Adult Health

## 2023-05-28 ENCOUNTER — Ambulatory Visit (INDEPENDENT_AMBULATORY_CARE_PROVIDER_SITE_OTHER): Payer: Medicare Other | Admitting: Adult Health

## 2023-06-10 ENCOUNTER — Ambulatory Visit (INDEPENDENT_AMBULATORY_CARE_PROVIDER_SITE_OTHER): Payer: Medicare Other | Admitting: Family Medicine

## 2023-06-10 ENCOUNTER — Encounter (INDEPENDENT_AMBULATORY_CARE_PROVIDER_SITE_OTHER): Payer: Self-pay | Admitting: Family Medicine

## 2023-06-10 DIAGNOSIS — E1169 Type 2 diabetes mellitus with other specified complication: Secondary | ICD-10-CM

## 2023-06-10 DIAGNOSIS — E559 Vitamin D deficiency, unspecified: Secondary | ICD-10-CM

## 2023-06-10 DIAGNOSIS — I152 Hypertension secondary to endocrine disorders: Secondary | ICD-10-CM | POA: Diagnosis not present

## 2023-06-10 DIAGNOSIS — Z7985 Long-term (current) use of injectable non-insulin antidiabetic drugs: Secondary | ICD-10-CM

## 2023-06-10 DIAGNOSIS — E1159 Type 2 diabetes mellitus with other circulatory complications: Secondary | ICD-10-CM

## 2023-06-10 DIAGNOSIS — Z6839 Body mass index (BMI) 39.0-39.9, adult: Secondary | ICD-10-CM

## 2023-06-10 NOTE — Progress Notes (Signed)
Breanna Ross, D.O.  ABFM, ABOM Specializing in Clinical Bariatric Medicine  Office located at: [address] [place], [place]  [address]   Assessment and Plan:   FOR THE DISEASE OF OBESITY: Morbid obesity (HCC), Starting BMI 43.12 Assessment & Plan: Since last office visit on 05/08/23 patient's Muscle mass has decreased by 4.2 lb. Fat mass has decreased by 4.4 lb. Total body water has decreased by 1.2 lb.  Counseling done on how various foods will affect these numbers and how to maximize success  Total lbs lost to date: 21 lbs  Total weight loss percentage to date: 7.92%    Recommended Dietary Goals Breanna Ross is currently in the action stage of change. As such, her goal is to continue weight management plan.  She has agreed to: continue current plan   Behavioral Intervention We discussed the following Behavioral Modification Strategies today: healthy bagel alternatives, celebration eating strategies, making foods she loves in a healthy manner, increasing lean protein intake.   Additional resources provided today: Handout on thanksgiving eating strategies, recipe packet #1 and #2 , handout on protein equivalents of 2 ounces of meat or seafood  Evidence-based interventions for health behavior change were utilized today including the discussion of self monitoring techniques, problem-solving barriers and SMART goal setting techniques.   Regarding patient's less desirable eating habits and patterns, we employed the technique of small changes.   Pt will specifically work on: increasing protein intake    Recommended Physical Activity Goals Breanna Ross has been advised to work up to 150 minutes of moderate intensity aerobic activity a week and strengthening exercises 2-3 times per week for cardiovascular health, weight loss maintenance and preservation of muscle mass.   She has agreed to : Continue current level of physical activity    Pharmacotherapy We both agreed to : continue  current anti-obesity medication regimen   FOR ASSOCIATED CONDITIONS ADDRESSED TODAY:  Type 2 diabetes mellitus with morbid obesity (HCC) Assessment & Plan: Most recent Hemoglobin A1c and fasting insulin: Lab Results  Component Value Date   HGBA1C 6.1 (H) 03/19/2023   HGBA1C 6.8 (H) 06/10/2016   HGBA1C 6.9 (H) 05/31/2016   INSULIN 10.6 03/19/2023    T2DM treated with Ozempic 0.5 mg once weekly. Pt tolerating Ozempic well without any adverse effects. She reports that her hunger and cravings are pretty well controlled. Once in a while, she craves fried foods.   Continue GLP-1 therapy per PCP/specialist. Continue with weight loss therapy via reduced calorie nutritional plan.    Hypertension associated with diabetes (HCC) Assessment & Plan: Last 3 blood pressure readings in our office are as follows: BP Readings from Last 3 Encounters:  06/10/23 139/86  05/08/23 (!) 142/83  04/24/23 130/78   HTN treated with Amplodipine & Valsatran-Hydrocholorthiazide. Blood pressure stable. Continue with our low salt, heart healthy meal plan and advance exercise as tolerated. Continue with all antihypertensives at current doses.    Vitamin D deficiency Assessment & Plan: Most recent vit D:  Lab Results  Component Value Date   VD25OH 84.6 03/19/2023    Currently on Cholecalciferol 5,000 units once daily. Continue with weight loss efforts and current supplementation regiment. Vitamin D; future.    FOLLOW UP: Return 07/10/23. She was informed of the importance of frequent follow up visits to maximize her success with intensive lifestyle modifications for her multiple health conditions.  Subjective:   Chief complaint: Obesity Breanna Ross is here to discuss her progress with her obesity treatment plan. She is on  the Cat 1 MP with B & L options + begin journaling breakfast 200 calories and 15+ grams protein and states she is following her eating plan approximately 0% of the time. She states she is not  exercising.   Interval History:  Breanna Ross is here for a follow up office visit. Since last OV, Breanna Ross is down 9 lbs. She endorses that the last few weeks have been stressful because her son was in the hospital for 17 days due to heart problems. He is back home and doing better. She walked a lot in the hospital, apart from this no formal exercise. Food wise, she admits to  poor adherence to the meal plan.   Barriers identified: none  Pharmacotherapy for weight loss: She is currently taking  Ozempic 0.5 mg once a week .   Review of Systems:  Pertinent positives were addressed with patient today.  Reviewed by clinician on day of visit: allergies, medications, problem list, medical history, surgical history, family history, social history, and previous encounter notes.  Weight Summary and Biometrics   Weight Lost Since Last Visit: 9lb  Weight Gained Since Last Visit: 0lb   Vitals Temp: 97.9 F (36.6 C) BP: 139/86 Pulse Rate: 84 SpO2: 97 %   Anthropometric Measurements Height: 5\' 6"  (1.676 m) Weight: 244 lb (110.7 kg) BMI (Calculated): 39.4 Weight at Last Visit: 253lb Weight Lost Since Last Visit: 9lb Weight Gained Since Last Visit: 0lb Starting Weight: 265lb Total Weight Loss (lbs): 21 lb (9.526 kg) Peak Weight: 295lb   Body Composition  Body Fat %: 48.8 % Fat Mass (lbs): 119.4 lbs Muscle Mass (lbs): 119 lbs Total Body Water (lbs): 90.8 lbs Visceral Fat Rating : 17   Other Clinical Data Fasting: no Labs: no Today's Visit #: 5 Starting Date: 03/19/23   Objective:   PHYSICAL EXAM: Blood pressure 139/86, pulse 84, temperature 97.9 F (36.6 C), height 5\' 6"  (1.676 m), weight 244 lb (110.7 kg), SpO2 97%. Body mass index is 39.38 kg/m.  General: she is overweight, cooperative and in no acute distress. PSYCH: Has normal mood, affect and thought process.   HEENT: EOMI, sclerae are anicteric. Lungs: Normal breathing effort, no conversational  dyspnea. Extremities: Moves * 4 Neurologic: A and O * 3, good insight  DIAGNOSTIC DATA REVIEWED: BMET    Component Value Date/Time   NA 142 03/19/2023 1103   K 4.0 03/19/2023 1103   CL 101 03/19/2023 1103   CO2 27 03/19/2023 1103   GLUCOSE 72 03/19/2023 1103   GLUCOSE 172 (H) 06/11/2016 0433   BUN 16 03/19/2023 1103   CREATININE 0.81 03/19/2023 1103   CALCIUM 9.4 03/19/2023 1103   GFRNONAA 64 01/03/2020 1007   GFRAA 74 01/03/2020 1007   Lab Results  Component Value Date   HGBA1C 6.1 (H) 03/19/2023   HGBA1C 6.8 (H) 06/21/2013   Lab Results  Component Value Date   INSULIN 10.6 03/19/2023   Lab Results  Component Value Date   TSH 1.840 03/19/2023   CBC    Component Value Date/Time   WBC 9.3 03/19/2023 1103   WBC 9.4 06/12/2016 0652   RBC 5.16 03/19/2023 1103   RBC 3.81 (L) 06/12/2016 0652   HGB 14.9 03/19/2023 1103   HCT 45.3 03/19/2023 1103   PLT 302 03/19/2023 1103   MCV 88 03/19/2023 1103   MCH 28.9 03/19/2023 1103   MCH 27.0 06/12/2016 0652   MCHC 32.9 03/19/2023 1103   MCHC 31.4 06/12/2016 0652   RDW 13.6  03/19/2023 1103   Iron Studies No results found for: "IRON", "TIBC", "FERRITIN", "IRONPCTSAT" Lipid Panel     Component Value Date/Time   CHOL 186 03/19/2023 1103   TRIG 99 03/19/2023 1103   HDL 68 03/19/2023 1103   LDLCALC 100 (H) 03/19/2023 1103   Hepatic Function Panel     Component Value Date/Time   PROT 7.5 03/19/2023 1103   ALBUMIN 4.4 03/19/2023 1103   AST 18 03/19/2023 1103   ALT 18 03/19/2023 1103   ALKPHOS 90 03/19/2023 1103   BILITOT 0.3 03/19/2023 1103      Component Value Date/Time   TSH 1.840 03/19/2023 1103   Nutritional Lab Results  Component Value Date   VD25OH 84.6 03/19/2023    Attestations:   Reviewed by clinician on day of visit: allergies, medications, problem list, medical history, surgical history, family history, social history, and previous encounter notes pertinent to patient's obesity diagnosis. I have  spent 42 minutes in the care of the patient today including: preparing to see patient (e.g. review and interpretation of tests, old notes ), obtaining and/or reviewing separately obtained history, performing a medically appropriate examination or evaluation, counseling and educating the patient, ordering medications, test or procedures, documenting clinical information in the electronic or other health care record, and independently interpreting results and communicating results to the patient, family, or caregiver   I, Special Randolm Idol, acting as a Stage manager for Thomasene Lot, DO., have compiled all relevant documentation for today's office visit on behalf of Thomasene Lot, DO, while in the presence of [employer], DO.  I have reviewed the above documentation for accuracy and completeness, and I agree with the above. Breanna Ross, D.O.  The 21st Century Cures Act was signed into law in 2016 which includes the topic of electronic health records.  This provides immediate access to information in MyChart.  This includes consultation notes, operative notes, office notes, lab results and pathology reports.  If you have any questions about what you read please let us know at your next visit so we can discuss your concerns and take corrective action if need be.  We are right here with you.

## 2023-07-10 ENCOUNTER — Ambulatory Visit (INDEPENDENT_AMBULATORY_CARE_PROVIDER_SITE_OTHER): Payer: Medicare Other | Admitting: Adult Health

## 2023-07-10 ENCOUNTER — Encounter (INDEPENDENT_AMBULATORY_CARE_PROVIDER_SITE_OTHER): Payer: Self-pay | Admitting: Adult Health

## 2023-07-10 DIAGNOSIS — E1159 Type 2 diabetes mellitus with other circulatory complications: Secondary | ICD-10-CM

## 2023-07-10 DIAGNOSIS — I152 Hypertension secondary to endocrine disorders: Secondary | ICD-10-CM | POA: Diagnosis not present

## 2023-07-10 DIAGNOSIS — Z6839 Body mass index (BMI) 39.0-39.9, adult: Secondary | ICD-10-CM

## 2023-07-10 DIAGNOSIS — E669 Obesity, unspecified: Secondary | ICD-10-CM | POA: Diagnosis not present

## 2023-07-10 DIAGNOSIS — E1169 Type 2 diabetes mellitus with other specified complication: Secondary | ICD-10-CM

## 2023-07-10 DIAGNOSIS — Z7985 Long-term (current) use of injectable non-insulin antidiabetic drugs: Secondary | ICD-10-CM

## 2023-07-10 NOTE — Progress Notes (Signed)
WEIGHT SUMMARY AND BIOMETRICS  Vitals Temp: 98 F (36.7 C) BP: 137/86 Pulse Rate: 78 SpO2: 98 %   Anthropometric Measurements Height: 5\' 6"  (1.676 m) Weight: 247 lb (112 kg) BMI (Calculated): 39.89 Weight at Last Visit: 244lb Weight Lost Since Last Visit: 0 Weight Gained Since Last Visit: 3lb Starting Weight: 265lb Total Weight Loss (lbs): 18 lb (8.165 kg) Peak Weight: 295lb   Body Composition  Body Fat %: 49.2 % Fat Mass (lbs): 121.8 lbs Muscle Mass (lbs): 119.4 lbs Total Body Water (lbs): 90.4 lbs Visceral Fat Rating : 17   Other Clinical Data Fasting: no Labs: no Today's Visit #: 6 Starting Date: 03/19/23    Chief Complaint:   OBESITY Breanna Ross is here to discuss her progress with her obesity treatment plan. She is on the the Category 1 Plan and states she is following her eating plan approximately 10 % of the time.  She states she is exercising Walking and NEAT Activities.   Interim History:  Her 17 year old son has worsening CHF, significant increase in edema and dyspnea since Feb 2024 He is now on [facility]. He also recently established with [facility] team, re: Dr. Gala Romney  Breanna Ross has been travelling back and forth from [place] to [place] to support her son through this process. Per pt- healthy food options were quite scarce and they often eat fast food as it was the only option new hospital/hotel.   Subjective:   1. Hypertension associated with diabetes (HCC) BP elevated at OV She denies tobacco/vape use She denies CP with exertion She endorses increased stress and travel the last two weeks, re: son's worsening heart failure She is currently on: valsartan-hydrochlorothiazide (DIOVAN-HCT) 320-25 MG per tablet  colesevelam (WELCHOL) 625 MG tablet  omega-3 acid ethyl esters (LOVAZA) 1 g capsule  amLODipine (NORVASC) 10 MG tablet  aspirin EC 81 MG tablet   2. Type 2 diabetes mellitus with morbid obesity (HCC) Per  pt- Dr. Kirtland Bouchard. Renae Gloss is AGREEABLE for [facility] to assume management of Ozmepic therapy. She is currently on weekly Ozempic 0.5mg  She estimates to have been on the lowest mx dose for >1 year Denies mass in neck, dysphagia, dyspepsia, persistent hoarseness, abdominal pain, or N/V/C  Lab Results  Component Value Date   HGBA1C 6.1 (H) 03/19/2023   HGBA1C 6.8 (H) 06/10/2016   HGBA1C 6.9 (H) 05/31/2016    A1c at goal She has not been checking CBG at home She denies sx's of hypoglycemia   Assessment/Plan:   1. Hypertension associated with diabetes (HCC) Continue valsartan-hydrochlorothiazide (DIOVAN-HCT) 320-25 MG per tablet  colesevelam (WELCHOL) 625 MG tablet  omega-3 acid ethyl esters (LOVAZA) 1 g capsule  amLODipine (NORVASC) 10 MG tablet  aspirin EC 81 MG tablet   2. Type 2 diabetes mellitus with morbid obesity (HCC) Continue weekly Ozempic 0.5mg  When refill needed, consider increasing dose to 1mg   3. Obesity (BMI 30-39.9), Current 39.89  Breanna Ross is currently in the action stage of change. As such, her goal is to maintain weight for now. She has agreed to the Category 1 Plan.   Exercise goals: Older adults should follow the adult guidelines. When older adults cannot meet the adult guidelines, they should be as physically active as their abilities and conditions will allow.  Older adults should do exercises that maintain or improve balance if they are at risk of falling.  Older adults should determine their level of effort for physical activity relative to their  level of fitness.  Older adults with chronic conditions should understand whether and how their conditions affect their ability to do regular physical activity safely.  Behavioral modification strategies: increasing lean protein intake, decreasing simple carbohydrates, increasing vegetables, increasing water intake, meal planning and cooking strategies, keeping healthy foods in the home, ways to avoid boredom eating, travel eating  strategies, holiday eating strategies , celebration eating strategies, and planning for success.  Breanna Ross has agreed to follow-up with our clinic in 3 weeks. She was informed of the importance of frequent follow-up visits to maximize her success with intensive lifestyle modifications for her multiple health conditions.   Objective:   Blood pressure 137/86, pulse 78, temperature 98 F (36.7 C), height 5\' 6"  (1.676 m), weight 247 lb (112 kg), SpO2 98%. Body mass index is 39.87 kg/m.  General: Cooperative, alert, well developed, in no acute distress. HEENT: Conjunctivae and lids unremarkable. Cardiovascular: Regular rhythm.  Lungs: Normal work of breathing. Neurologic: No focal deficits.   Lab Results  Component Value Date   CREATININE 0.81 03/19/2023   BUN 16 03/19/2023   NA 142 03/19/2023   K 4.0 03/19/2023   CL 101 03/19/2023   CO2 27 03/19/2023   Lab Results  Component Value Date   ALT 18 03/19/2023   AST 18 03/19/2023   ALKPHOS 90 03/19/2023   BILITOT 0.3 03/19/2023   Lab Results  Component Value Date   HGBA1C 6.1 (H) 03/19/2023   HGBA1C 6.8 (H) 06/10/2016   HGBA1C 6.9 (H) 05/31/2016   HGBA1C 6.8 (H) 06/21/2013   Lab Results  Component Value Date   INSULIN 10.6 03/19/2023   Lab Results  Component Value Date   TSH 1.840 03/19/2023   Lab Results  Component Value Date   CHOL 186 03/19/2023   HDL 68 03/19/2023   LDLCALC 100 (H) 03/19/2023   TRIG 99 03/19/2023   Lab Results  Component Value Date   VD25OH 84.6 03/19/2023   Lab Results  Component Value Date   WBC 9.3 03/19/2023   HGB 14.9 03/19/2023   HCT 45.3 03/19/2023   MCV 88 03/19/2023   PLT 302 03/19/2023   No results found for: "IRON", "TIBC", "FERRITIN"  Attestation Statements:   Reviewed by clinician on day of visit: allergies, medications, problem list, medical history, surgical history, family history, social history, and previous encounter notes.  Time spent on visit including pre-visit  chart review and post-visit care and charting was 28 minutes.   I have reviewed the above documentation for accuracy and completeness, and I agree with the above. -  Miranda Frese d. Dorsey Authement, NP-C

## 2023-07-14 ENCOUNTER — Encounter (INDEPENDENT_AMBULATORY_CARE_PROVIDER_SITE_OTHER): Payer: Self-pay | Admitting: Adult Health

## 2023-07-31 ENCOUNTER — Ambulatory Visit (INDEPENDENT_AMBULATORY_CARE_PROVIDER_SITE_OTHER): Payer: Medicare Other | Admitting: Family Medicine

## 2023-07-31 ENCOUNTER — Encounter (INDEPENDENT_AMBULATORY_CARE_PROVIDER_SITE_OTHER): Payer: Self-pay | Admitting: Family Medicine

## 2023-07-31 DIAGNOSIS — E1159 Type 2 diabetes mellitus with other circulatory complications: Secondary | ICD-10-CM | POA: Diagnosis not present

## 2023-07-31 DIAGNOSIS — E669 Obesity, unspecified: Secondary | ICD-10-CM

## 2023-07-31 DIAGNOSIS — E1169 Type 2 diabetes mellitus with other specified complication: Secondary | ICD-10-CM

## 2023-07-31 DIAGNOSIS — I152 Hypertension secondary to endocrine disorders: Secondary | ICD-10-CM

## 2023-07-31 DIAGNOSIS — K5909 Other constipation: Secondary | ICD-10-CM

## 2023-07-31 DIAGNOSIS — E65 Localized adiposity: Secondary | ICD-10-CM

## 2023-07-31 DIAGNOSIS — Z7985 Long-term (current) use of injectable non-insulin antidiabetic drugs: Secondary | ICD-10-CM

## 2023-07-31 DIAGNOSIS — Z6839 Body mass index (BMI) 39.0-39.9, adult: Secondary | ICD-10-CM

## 2023-07-31 MED ORDER — POLYETHYLENE GLYCOL 3350 17 G PO PACK: 17.0000 g | PACK | Freq: Two times a day (BID) | ORAL | 0 refills | Status: AC

## 2023-07-31 MED ORDER — SEMAGLUTIDE (1 MG/DOSE) 4 MG/3ML ~~LOC~~ SOPN: 1.0000 mg | PEN_INJECTOR | SUBCUTANEOUS | 0 refills | Status: DC

## 2023-07-31 NOTE — Progress Notes (Signed)
 Breanna Ross, D.O.  ABFM, ABOM Specializing in Clinical Bariatric Medicine  Office located at: [address] [place], [place]  [address]   Assessment and Plan:   FOR THE DISEASE OF OBESITY: Obesity (BMI 30-39.9), Current 39.24 Morbid obesity (HCC), Starting BMI 43.12 Assessment & Plan: Since last office visit on 07/10/23 patient's  Muscle mass has increased by 0.4 lb. Fat mass has decreased by 4.4 lb. Total body water has decreased by 2.4 lb.  Counseling done on how various foods will affect these numbers and how to maximize success  Total lbs lost to date: 22 lbs  Total weight loss percentage to date: 8.30%    Recommended Dietary Goals Breanna Ross is currently in the action stage of change. As such, her goal is to continue weight management plan.  She has agreed to:  continue current plan   Behavioral Intervention We discussed the following today: different brands of protein shakes,  increasing lean protein intake to established goals  Additional resources provided today: None  Evidence-based interventions for health behavior change were utilized today including the discussion of self monitoring techniques, problem-solving barriers and SMART goal setting techniques.   Regarding patient's less desirable eating habits and patterns, we employed the technique of small changes.   Pt will specifically work on: n/a    Recommended Physical Activity Goals [name] has been advised to work up to 150 minutes of moderate intensity aerobic activity a week and strengthening exercises 2-3 times per week for cardiovascular health, weight loss maintenance and preservation of muscle mass.   She has agreed to : start the Walk At Home Series 15-20 minutes, 2-3 days a week.    Pharmacotherapy We both agreed to : increase Ozempic  to 1 mg weekly   FOR ASSOCIATED CONDITIONS ADDRESSED TODAY:  Hypertension associated with diabetes (HCC) Assessment & Plan: Last 3 blood pressure readings in  our office are as follows: BP Readings from Last 3 Encounters:  07/31/23 133/73  07/10/23 137/86  06/10/23 139/86   Relevant medications: Diovan -HCT, Welchol , Lovaza, Norvasc, and Aspirin . Blood pressure stable. No concerns in this regard today. Continue with all meds. Continue with prudent nutritional plan and low sodium diet, advance exercise as tolerated.     Type 2 diabetes mellitus with morbid obesity (HCC) Assessment & Plan: T2DM well controlled on Ozempic  0.5 mg weekly. Pt sometimes has constipation, which has been tolerable. She uses Miralax  as needed. She has hunger and cravings for carbs in the evening, despite eating adequate amounts of protein during dinner.   Will increase Ozempic  to 1 mg weekly. Continue with Miralax . Pt reminded about maintaining adequate hydration. Continue with weight loss efforts via prudent nutritional plan.   Orders: -     Semaglutide  (1 MG/DOSE); Inject 1 mg as directed once a week.  Dispense: 3 mL; Refill: 0   Other constipation Assessment & Plan: See T2DM note.  Orders: -     Polyethylene Glycol 3350 ; Take 17 g by mouth 2 (two) times daily. Until stooling regularly, then once daily or PRN  Dispense: 60 packet; Refill: 0   Visceral obesity Assessment & Plan: Since last OV, pt's visceral fat rating has dropped from 17 to 16.  The visceral fat rating should be < 12 in a female. Visceral adipose tissue is a hormonally active component of total body fat. This body composition phenotype is associated with medical disorders such as metabolic syndrome, cardiovascular disease and several malignancies including prostate, breast, and colorectal cancers. Goal: working on  losing 7-10% of weight via prudent nutritional plan and lifestyle changes.     Follow up:   Return 08/21/2023. She was informed of the importance of frequent follow up visits to maximize her success with intensive lifestyle modifications for her multiple health conditions.  Subjective:    Chief complaint: Obesity [name] is here to discuss her progress with her obesity treatment plan. She is on the the Category 1 Plan and states she is following her eating plan approximately 50% of the time. She states she is not exercising.   Interval History:  Ronal KATHEE Ross is here for a follow up office visit. Since last OV on 07/10/2023, Breanna Ross is down 4 lbs. She was stressed over this period due to some health issues experienced by her family members. She walked a lot in the hospital at [facility]. Food wise, for the majority of the time,  she limited her starch intake and increased her protein intake. She struggled a bit with the fried foods, also had cake on a few occasions.Overall, hunger and cravings are stable.   Pharmacotherapy for weight loss: She is currently taking  Ozempic  0.5 weekly  .   Review of Systems:  Pertinent positives were addressed with patient today.  Reviewed by clinician on day of visit: allergies, medications, problem list, medical history, surgical history, family history, social history, and previous encounter notes.  Weight Summary and Biometrics   Weight Lost Since Last Visit: 4lb  Weight Gained Since Last Visit: 0lb   Vitals Temp: 97.8 F (36.6 C) BP: 133/73 Pulse Rate: 65 SpO2: 98 %   Anthropometric Measurements Height: 5' 6 (1.676 m) Weight: 243 lb (110.2 kg) BMI (Calculated): 39.24 Weight at Last Visit: 247lb Weight Lost Since Last Visit: 4lb Weight Gained Since Last Visit: 0lb Starting Weight: 265lb Total Weight Loss (lbs): 22 lb (9.979 kg) Peak Weight: 295lb   Body Composition  Body Fat %: 48.2 % Fat Mass (lbs): 117.4 lbs Muscle Mass (lbs): 119.8 lbs Total Body Water (lbs): 88 lbs Visceral Fat Rating : 16   Other Clinical Data Fasting: no Labs: no Today's Visit #: 7 Starting Date: 03/19/23   Objective:   PHYSICAL EXAM: Blood pressure 133/73, pulse 65, temperature 97.8 F (36.6 C), height 5' 6 (1.676 m), weight 243 lb (110.2  kg), SpO2 98%. Body mass index is 39.22 kg/m.  General: she is overweight, cooperative and in no acute distress. PSYCH: Has normal mood, affect and thought process.   HEENT: EOMI, sclerae are anicteric. Lungs: Normal breathing effort, no conversational dyspnea. Extremities: Moves * 4 Neurologic: A and O * 3, good insight  DIAGNOSTIC DATA REVIEWED: BMET    Component Value Date/Time   NA 142 05/12/2023 0000   K 3.9 05/12/2023 0000   CL 101 05/12/2023 0000   CO2 26 (A) 05/12/2023 0000   GLUCOSE 72 03/19/2023 1103   GLUCOSE 172 (H) 06/11/2016 0433   BUN 17 05/12/2023 0000   CREATININE 0.8 05/12/2023 0000   CREATININE 0.81 03/19/2023 1103   CALCIUM  9.6 05/12/2023 0000   GFRNONAA 64 01/03/2020 1007   GFRAA 74 01/03/2020 1007   Lab Results  Component Value Date   HGBA1C 5.7 05/12/2023   HGBA1C 6.8 (H) 06/21/2013   Lab Results  Component Value Date   INSULIN  10.6 03/19/2023   Lab Results  Component Value Date   TSH 1.840 03/19/2023   CBC    Component Value Date/Time   WBC 5.8 05/12/2023 0000   WBC 9.4 06/12/2016 9347  RBC 5.21 (A) 05/12/2023 0000   HGB 15.0 05/12/2023 0000   HGB 14.9 03/19/2023 1103   HCT 46 05/12/2023 0000   HCT 45.3 03/19/2023 1103   PLT 297 05/12/2023 0000   PLT 302 03/19/2023 1103   MCV 88 03/19/2023 1103   MCH 28.9 03/19/2023 1103   MCH 27.0 06/12/2016 0652   MCHC 32.9 03/19/2023 1103   MCHC 31.4 06/12/2016 0652   RDW 13.6 03/19/2023 1103   Iron Studies No results found for: IRON, TIBC, FERRITIN, IRONPCTSAT Lipid Panel     Component Value Date/Time   CHOL 181 05/12/2023 0000   CHOL 186 03/19/2023 1103   TRIG 104 05/12/2023 0000   HDL 54 05/12/2023 0000   HDL 68 03/19/2023 1103   LDLCALC 108 05/12/2023 0000   LDLCALC 100 (H) 03/19/2023 1103   Hepatic Function Panel     Component Value Date/Time   PROT 7.5 03/19/2023 1103   ALBUMIN 4.4 05/12/2023 0000   ALBUMIN 4.4 03/19/2023 1103   AST 19 05/12/2023 0000   ALT 23  05/12/2023 0000   ALKPHOS 81 05/12/2023 0000   BILITOT 0.3 03/19/2023 1103      Component Value Date/Time   TSH 1.840 03/19/2023 1103   Nutritional Lab Results  Component Value Date   VD25OH 115.0 05/12/2023   VD25OH 84.6 03/19/2023    Attestations:   I, Special Puri, acting as a stage manager for [employer], DO., have compiled all relevant documentation for today's office visit on behalf of Breanna Jenkins, DO, while in the presence of [employer], DO.  Reviewed by clinician on day of visit: allergies, medications, problem list, medical history, surgical history, family history, social history, and previous encounter notes pertinent to patient's obesity diagnosis. I have spent 40 minutes in the care of the patient today including: preparing to see patient (e.g. review and interpretation of tests, old notes ), obtaining and/or reviewing separately obtained history, performing a medically appropriate examination or evaluation, counseling and educating the patient, ordering medications, test or procedures, documenting clinical information in the electronic or other health care record, and independently interpreting results and communicating results to the patient, family, or caregiver   I have reviewed the above documentation for accuracy and completeness, and I agree with the above. Breanna Ross, D.O.  The 21st Century Cures Act was signed into law in 2016 which includes the topic of electronic health records.  This provides immediate access to information in MyChart.  This includes consultation notes, operative notes, office notes, lab results and pathology reports.  If you have any questions about what you read please let us  know at your next visit so we can discuss your concerns and take corrective action if need be.  We are right here with you.

## 2023-08-21 ENCOUNTER — Encounter (INDEPENDENT_AMBULATORY_CARE_PROVIDER_SITE_OTHER): Payer: Self-pay | Admitting: Adult Health

## 2023-08-21 ENCOUNTER — Ambulatory Visit (INDEPENDENT_AMBULATORY_CARE_PROVIDER_SITE_OTHER): Payer: Medicare Other | Admitting: Adult Health

## 2023-08-21 DIAGNOSIS — E559 Vitamin D deficiency, unspecified: Secondary | ICD-10-CM | POA: Diagnosis not present

## 2023-08-21 DIAGNOSIS — I152 Hypertension secondary to endocrine disorders: Secondary | ICD-10-CM

## 2023-08-21 DIAGNOSIS — E1169 Type 2 diabetes mellitus with other specified complication: Secondary | ICD-10-CM | POA: Diagnosis not present

## 2023-08-21 DIAGNOSIS — R42 Dizziness and giddiness: Secondary | ICD-10-CM | POA: Diagnosis not present

## 2023-08-21 DIAGNOSIS — E669 Obesity, unspecified: Secondary | ICD-10-CM

## 2023-08-21 DIAGNOSIS — E1159 Type 2 diabetes mellitus with other circulatory complications: Secondary | ICD-10-CM | POA: Diagnosis not present

## 2023-08-21 DIAGNOSIS — Z7985 Long-term (current) use of injectable non-insulin antidiabetic drugs: Secondary | ICD-10-CM

## 2023-08-21 NOTE — Progress Notes (Addendum)
 WEIGHT SUMMARY AND BIOMETRICS  Vitals Temp: 98.1 F (36.7 C) BP: 135/83 Pulse Rate: 79 SpO2: 99 %   Anthropometric Measurements Height: 5' 6 (1.676 m) Weight: 246 lb (111.6 kg) BMI (Calculated): 39.72 Weight at Last Visit: 243lb Weight Lost Since Last Visit: 0 Weight Gained Since Last Visit: 3lb Starting Weight: 265lb Total Weight Loss (lbs): 19 lb (8.618 kg) Peak Weight: 295lb   Body Composition  Body Fat %: 48.9 % Fat Mass (lbs): 120.4 lbs Muscle Mass (lbs): 119.6 lbs Total Body Water (lbs): 90.6 lbs Visceral Fat Rating : 17   Other Clinical Data Fasting: no Labs: no Today's Visit #: 8 Starting Date: 03/19/23    Chief Complaint:   OBESITY Breanna Ross is here to discuss her progress with her obesity treatment plan. She is on the the Category 1 Plan and states she is following her eating plan approximately 25 % of the time.  She states she is exercising Walking 15 minutes 2 times per week.   Interim History:  Ms. Breanna Ross reports waking with dizziness this morning. She denies HA or change in vision. She reports intermittent floaters in vision.  Her BP is stable at OV Her water weight is stable per Bioimpedance scale.  She denies CP/palpitations. She has hx of vertigo and dizziness. PCP has previously provided Rx Antivert.  She reports being able to dress, perform ADLs and IADLs, and drive safely today.  Subjective:   1. Dizziness Ms. Breanna Ross reports waking with dizziness this morning. She denies HA or change in vision. She reports intermittent floaters in vision. Her BP is stable at OV Her water weight is stable per Bioimpedance scale. She denies CP/palpitations. She has hx of vertigo and dizziness. PCP has previously provided Rx Antivert.  2. Vitamin D  deficiency  05/12/23 00:00  Vitamin D , 25-Hydroxy 115.0 (E)  (E): External lab result  She is taking OTC [place], OTC Multivitamin, and OTC Calcium  with D3 She denies  N/V/Muscle Weakness  3. Type 2 diabetes mellitus with morbid obesity (HCC) Lab Results  Component Value Date   HGBA1C 5.7 05/12/2023   HGBA1C 6.1 (H) 03/19/2023   HGBA1C 6.8 (H) 06/10/2016    She has yet to increase to Ozempic  1mg  She took final dose of Ozempic  0.5mg  08/21/2023 She will increase to Ozempic  1mg  on 08/28/2023  4. Hypertension associated with diabetes (HCC) BP stable at OV She CP with exertion She is on  valsartan -hydrochlorothiazide  (DIOVAN -HCT) 320-25 MG per tablet  colesevelam  (WELCHOL ) 625 MG tablet  omega-3 acid ethyl esters (LOVAZA) 1 g capsule  amLODipine (NORVASC) 10 MG tablet  aspirin  EC 81 MG tablet   Assessment/Plan:   1. Dizziness Increase water Take Antivert when she returns home F/u with PCP  2. Vitamin D  deficiency Check Labs - VITAMIN D  25 Hydroxy (Vit-D Deficiency, Fractures)  3. Type 2 diabetes mellitus with morbid obesity (HCC) (Primary) Check Labs - Hemoglobin A1c  4. Hypertension associated with diabetes (HCC) Continue valsartan -hydrochlorothiazide  (DIOVAN -HCT) 320-25 MG per tablet  colesevelam  (WELCHOL ) 625 MG tablet  omega-3 acid ethyl esters (LOVAZA) 1 g capsule  amLODipine (NORVASC) 10 MG tablet  aspirin  EC 81 MG tablet   6. Morbid obesity (HCC), Starting BMI 43.12  Breanna Ross is currently in the action stage of change. As such, her goal is to continue with weight loss efforts. She has agreed to the Category 1 Plan.   Exercise goals: No exercise has been prescribed at this time.  Behavioral modification strategies: increasing lean protein  intake, decreasing simple carbohydrates, increasing vegetables, increasing water intake, no skipping meals, meal planning and cooking strategies, keeping healthy foods in the home, and planning for success.  Breanna Ross has agreed to follow-up with our clinic in 4 weeks. She was informed of the importance of frequent follow-up visits to maximize her success with intensive lifestyle modifications for her  multiple health conditions.   Breanna Ross was informed we would discuss her lab results at her next visit unless there is a critical issue that needs to be addressed sooner. Breanna Ross agreed to keep her next visit at the agreed upon time to discuss these results.  Objective:   Blood pressure 135/83, pulse 79, temperature 98.1 F (36.7 C), height 5' 6 (1.676 m), weight 246 lb (111.6 kg), SpO2 99%. Body mass index is 39.71 kg/m.  General: Cooperative, alert, well developed, in no acute distress. HEENT: Conjunctivae and lids unremarkable. Cardiovascular: Regular rhythm.  Lungs: Normal work of breathing. Neurologic: No focal deficits.   Lab Results  Component Value Date   CREATININE 0.8 05/12/2023   BUN 17 05/12/2023   NA 142 05/12/2023   K 3.9 05/12/2023   CL 101 05/12/2023   CO2 26 (A) 05/12/2023   Lab Results  Component Value Date   ALT 23 05/12/2023   AST 19 05/12/2023   ALKPHOS 81 05/12/2023   BILITOT 0.3 03/19/2023   Lab Results  Component Value Date   HGBA1C 5.7 05/12/2023   HGBA1C 6.1 (H) 03/19/2023   HGBA1C 6.8 (H) 06/10/2016   HGBA1C 6.9 (H) 05/31/2016   HGBA1C 6.8 (H) 06/21/2013   Lab Results  Component Value Date   INSULIN  10.6 03/19/2023   Lab Results  Component Value Date   TSH 1.840 03/19/2023   Lab Results  Component Value Date   CHOL 181 05/12/2023   HDL 54 05/12/2023   LDLCALC 108 05/12/2023   TRIG 104 05/12/2023   Lab Results  Component Value Date   VD25OH 115.0 05/12/2023   VD25OH 84.6 03/19/2023   Lab Results  Component Value Date   WBC 5.8 05/12/2023   HGB 15.0 05/12/2023   HCT 46 05/12/2023   MCV 88 03/19/2023   PLT 297 05/12/2023   No results found for: IRON, TIBC, FERRITIN  Attestation Statements:   Reviewed by clinician on day of visit: allergies, medications, problem list, medical history, surgical history, family history, social history, and previous encounter notes.  I have reviewed the above documentation for accuracy and  completeness, and I agree with the above. -  Weslie Rasmus d. Nelwyn Hebdon, NP-C

## 2023-08-22 LAB — HEMOGLOBIN A1C
Est. average glucose Bld gHb Est-mCnc: 117 mg/dL
Hgb A1c MFr Bld: 5.7 % — ABNORMAL HIGH (ref 4.8–5.6)

## 2023-08-22 LAB — VITAMIN D 25 HYDROXY (VIT D DEFICIENCY, FRACTURES): Vit D, 25-Hydroxy: 77.3 ng/mL (ref 30.0–100.0)

## 2023-08-25 ENCOUNTER — Encounter (INDEPENDENT_AMBULATORY_CARE_PROVIDER_SITE_OTHER): Payer: Self-pay | Admitting: Adult Health

## 2023-09-22 ENCOUNTER — Other Ambulatory Visit (INDEPENDENT_AMBULATORY_CARE_PROVIDER_SITE_OTHER): Payer: Self-pay | Admitting: Adult Health

## 2023-09-22 ENCOUNTER — Other Ambulatory Visit (INDEPENDENT_AMBULATORY_CARE_PROVIDER_SITE_OTHER): Payer: Self-pay | Admitting: Family Medicine

## 2023-09-22 ENCOUNTER — Ambulatory Visit (INDEPENDENT_AMBULATORY_CARE_PROVIDER_SITE_OTHER): Payer: Medicare Other | Admitting: Adult Health

## 2023-09-22 ENCOUNTER — Telehealth (INDEPENDENT_AMBULATORY_CARE_PROVIDER_SITE_OTHER): Payer: Self-pay | Admitting: Adult Health

## 2023-09-22 MED ORDER — SEMAGLUTIDE (1 MG/DOSE) 4 MG/3ML ~~LOC~~ SOPN: 1.0000 mg | PEN_INJECTOR | SUBCUTANEOUS | 0 refills | Status: DC

## 2023-09-22 NOTE — Telephone Encounter (Signed)
 Good morning!   Patient called because she doesn't have a refill for Ozempic lined up and she took her last does on Thursday (02/27). She wants to know how to proceed. She had to push her appointment because her son died.   Dr. Val Eagle made the original script.  Would like to be notified with a solution and would like it to go to her same pharmacy.  Thanks!

## 2023-10-14 ENCOUNTER — Encounter (INDEPENDENT_AMBULATORY_CARE_PROVIDER_SITE_OTHER): Payer: Self-pay | Admitting: Adult Health

## 2023-10-14 ENCOUNTER — Ambulatory Visit (INDEPENDENT_AMBULATORY_CARE_PROVIDER_SITE_OTHER): Payer: Medicare Other | Admitting: Adult Health

## 2023-10-14 VITALS — BP 129/86 | HR 88 | Temp 97.6°F | Ht 66.0 in | Wt 243.0 lb

## 2023-10-14 DIAGNOSIS — F4321 Adjustment disorder with depressed mood: Secondary | ICD-10-CM | POA: Diagnosis not present

## 2023-10-14 DIAGNOSIS — Z634 Disappearance and death of family member: Secondary | ICD-10-CM

## 2023-10-14 DIAGNOSIS — Z7985 Long-term (current) use of injectable non-insulin antidiabetic drugs: Secondary | ICD-10-CM

## 2023-10-14 DIAGNOSIS — E1169 Type 2 diabetes mellitus with other specified complication: Secondary | ICD-10-CM

## 2023-10-14 DIAGNOSIS — E559 Vitamin D deficiency, unspecified: Secondary | ICD-10-CM | POA: Diagnosis not present

## 2023-10-14 DIAGNOSIS — Z6839 Body mass index (BMI) 39.0-39.9, adult: Secondary | ICD-10-CM

## 2023-10-14 DIAGNOSIS — E669 Obesity, unspecified: Secondary | ICD-10-CM | POA: Diagnosis not present

## 2023-10-14 MED ORDER — SEMAGLUTIDE (1 MG/DOSE) 4 MG/3ML ~~LOC~~ SOPN: 1.0000 mg | PEN_INJECTOR | SUBCUTANEOUS | 0 refills | Status: DC

## 2023-10-14 NOTE — Progress Notes (Signed)
 WEIGHT SUMMARY AND BIOMETRICS  Vitals Temp: 97.6 F (36.4 C) BP: 129/86 Pulse Rate: 88 SpO2: 99 %   Anthropometric Measurements Height: 5\' 6"  (1.676 m) Weight: 243 lb (110.2 kg) BMI (Calculated): 39.24 Weight at Last Visit: 246 lb Weight Lost Since Last Visit: 3 Weight Gained Since Last Visit: 0 Starting Weight: 265 lb Total Weight Loss (lbs): 22 lb (9.979 kg) Peak Weight: 295 lb   Body Composition  Body Fat %: 48.2 % Fat Mass (lbs): 117.6 lbs Muscle Mass (lbs): 119.8 lbs Total Body Water (lbs): 89.8 lbs Visceral Fat Rating : 16   Other Clinical Data Fasting: no Labs: no Today's Visit #: 9 Starting Date: 03/19/23    Chief Complaint:   OBESITY Breanna Ross is here to discuss her progress with her obesity treatment plan.  She is on the the Category 1 Plan and states she is following her eating plan approximately 0 % of the time.  She states she is exercising: NEAT Activities   Interim History:  Breanna Ross recently lost her 8 year old son, Timothy Ross. Breanna Ross passed away from complication Heart Failure. Breanna Ross was surrounded by friends and family his final days at [facility]. Breanna Ross laid her son to rest and has been handling all of his estate issues.   Breanna Ross endorses strong local support system of family/friends  Hunger/appetite-poor appetite since the passing of her son on 2023-10-02  Stress- Her son, Breanna Ross was hospitalized Jan 27- Sep 01, 2023 He was transferred to [facility] and subsequently passed away on 02-Oct-2023  Subjective:   1. Vitamin D deficiency Discussed Labs  Latest Reference Range & Units 08/21/23 14:52  Vitamin D, 25-Hydroxy 30.0 - 100.0 ng/mL 77.3   Vit D Level at goal She is taking daily OTC Vit D/Calcium supplement and daily OTC Vit D3 5,000 international units   2. Type 2 diabetes mellitus with morbid obesity (HCC) Discussed Labs  Latest Reference Range & Units 08/21/23 14:52  Hemoglobin A1C 4.8 -  5.6 % 5.7 (H)  Est. average glucose Bld gHb Est-mCnc mg/dL 161  (H): Data is abnormally high  A1c at goal She is currently on valsartan-hydrochlorothiazide (DIOVAN-HCT) 320-25 MG per tablet  aspirin EC 81 MG tablet  Semaglutide, 1 MG/DOSE, 4 MG/3ML SOPN   Denies mass in neck, dysphagia, dyspepsia, persistent hoarseness, abdominal pain, or N/V/C   3. Grief at loss of child Her son, Breanna Ross was hospitalized Jan 27- Sep 01, 2023 He was transferred to [facility] and subsequently passed away on 2023-10-02 Breanna Ross passed away from complication Heart Failure. Breanna Ross was surrounded by friends and family his final days at [facility]. Breanna Ross laid her son to rest and has been handling all of his estate issues.   Assessment/Plan:   1. Vitamin D deficiency (Primary) Continue current OTC supplementation  2. Type 2 diabetes mellitus with morbid obesity (HCC) Refill - Semaglutide, 1 MG/DOSE, 4 MG/3ML SOPN; Inject 1 mg as directed once a week.  Dispense: 9 mL; Refill: 0  3. Grief at loss of child Surround yourself with positive family/friends  4. Obesity (BMI 30-39.9), Current 39.3  Breanna Ross is currently in the action stage of change. As such, her goal is to continue with weight loss efforts. She has agreed to the Category 1 Plan.   Exercise goals: Older adults should follow the adult guidelines. When older adults cannot meet the adult guidelines, they should be as physically active as their abilities and conditions  will allow.  Older adults should do exercises that maintain or improve balance if they are at risk of falling.  Older adults should determine their level of effort for physical activity relative to their level of fitness.  Older adults with chronic conditions should understand whether and how their conditions affect their ability to do regular physical activity safely.  Behavioral modification strategies: increasing lean protein intake, decreasing simple carbohydrates,  increasing vegetables, increasing water intake, no skipping meals, meal planning and cooking strategies, keeping healthy foods in the home, ways to avoid boredom eating, and planning for success.  Breanna Ross has agreed to follow-up with our clinic in 3 weeks. She was informed of the importance of frequent follow-up visits to maximize her success with intensive lifestyle modifications for her multiple health conditions.   Objective:   Blood pressure 129/86, pulse 88, temperature 97.6 F (36.4 C), height 5\' 6"  (1.676 m), weight 243 lb (110.2 kg), SpO2 99%. Body mass index is 39.22 kg/m.  General: Cooperative, alert, well developed, in no acute distress. HEENT: Conjunctivae and lids unremarkable. Cardiovascular: Regular rhythm.  Lungs: Normal work of breathing. Neurologic: No focal deficits.   Lab Results  Component Value Date   CREATININE 0.8 05/12/2023   BUN 17 05/12/2023   NA 142 05/12/2023   K 3.9 05/12/2023   CL 101 05/12/2023   CO2 26 (A) 05/12/2023   Lab Results  Component Value Date   ALT 23 05/12/2023   AST 19 05/12/2023   ALKPHOS 81 05/12/2023   BILITOT 0.3 03/19/2023   Lab Results  Component Value Date   HGBA1C 5.7 (H) 08/21/2023   HGBA1C 5.7 05/12/2023   HGBA1C 6.1 (H) 03/19/2023   HGBA1C 6.8 (H) 06/10/2016   HGBA1C 6.9 (H) 05/31/2016   Lab Results  Component Value Date   INSULIN 10.6 03/19/2023   Lab Results  Component Value Date   TSH 1.840 03/19/2023   Lab Results  Component Value Date   CHOL 181 05/12/2023   HDL 54 05/12/2023   LDLCALC 108 05/12/2023   TRIG 104 05/12/2023   Lab Results  Component Value Date   VD25OH 77.3 08/21/2023   VD25OH 115.0 05/12/2023   VD25OH 84.6 03/19/2023   Lab Results  Component Value Date   WBC 5.8 05/12/2023   HGB 15.0 05/12/2023   HCT 46 05/12/2023   MCV 88 03/19/2023   PLT 297 05/12/2023   No results found for: "IRON", "TIBC", "FERRITIN"  Attestation Statements:   Reviewed by clinician on day of visit:  allergies, medications, problem list, medical history, surgical history, family history, social history, and previous encounter notes.  I have reviewed the above documentation for accuracy and completeness, and I agree with the above. -  Boyd Litaker d. Terrian Sentell, NP-C

## 2023-10-29 ENCOUNTER — Ambulatory Visit (INDEPENDENT_AMBULATORY_CARE_PROVIDER_SITE_OTHER): Admitting: Family Medicine

## 2023-10-29 ENCOUNTER — Encounter (INDEPENDENT_AMBULATORY_CARE_PROVIDER_SITE_OTHER): Payer: Self-pay | Admitting: Family Medicine

## 2023-10-29 VITALS — BP 146/94 | HR 82 | Temp 98.3°F | Ht 66.0 in | Wt 242.2 lb

## 2023-10-29 DIAGNOSIS — Z6839 Body mass index (BMI) 39.0-39.9, adult: Secondary | ICD-10-CM

## 2023-10-29 DIAGNOSIS — I152 Hypertension secondary to endocrine disorders: Secondary | ICD-10-CM | POA: Diagnosis not present

## 2023-10-29 DIAGNOSIS — E1159 Type 2 diabetes mellitus with other circulatory complications: Secondary | ICD-10-CM | POA: Diagnosis not present

## 2023-10-29 DIAGNOSIS — E559 Vitamin D deficiency, unspecified: Secondary | ICD-10-CM | POA: Diagnosis not present

## 2023-10-29 DIAGNOSIS — Z634 Disappearance and death of family member: Secondary | ICD-10-CM

## 2023-10-29 DIAGNOSIS — F4321 Adjustment disorder with depressed mood: Secondary | ICD-10-CM

## 2023-10-29 DIAGNOSIS — E1169 Type 2 diabetes mellitus with other specified complication: Secondary | ICD-10-CM | POA: Diagnosis not present

## 2023-10-29 DIAGNOSIS — Z7985 Long-term (current) use of injectable non-insulin antidiabetic drugs: Secondary | ICD-10-CM

## 2023-10-29 DIAGNOSIS — E669 Obesity, unspecified: Secondary | ICD-10-CM

## 2023-10-29 MED ORDER — FLUOXETINE HCL 10 MG PO TABS: 10.0000 mg | ORAL_TABLET | Freq: Every day | ORAL | 1 refills | Status: DC

## 2023-10-29 MED ORDER — VITAMIN D3 125 MCG (5000 UT) PO CAPS: ORAL_CAPSULE | ORAL | Status: DC

## 2023-10-29 NOTE — Progress Notes (Signed)
 Breanna Ross, D.O.  ABFM, ABOM Specializing in Clinical Bariatric Medicine  Office located at: [address] [place], [place]  [address]   Assessment and Plan:  No orders of the defined types were placed in this encounter.  Medications Discontinued During This Encounter  Medication Reason   Cholecalciferol (VITAMIN D3) 5000 units CAPS Reorder    Meds ordered this encounter  Medications   FLUoxetine (PROZAC) 10 MG tablet    Sig: Take 1 tablet (10 mg total) by mouth daily.    Dispense:  30 tablet    Refill:  1   Cholecalciferol (VITAMIN D3) 125 MCG (5000 UT) CAPS    Sig: 4,000IU     FOR THE DISEASE OF OBESITY:  Obesity (BMI 30-39.9), Current 39.11 Morbid obesity (HCC), Starting BMI 43.12 Assessment & Plan: Since last office visit on 10/14/2023, patient's muscle mass has increased by 0.4 lbs. Fat mass has decreased by 2 lbs. Total body water has decreased by 2.4 lbs.  Counseling done on how various foods will affect these numbers and how to maximize success  Total lbs lost to date: 23 lbs Total weight loss percentage to date: 8.68%    Recommended Dietary Goals Breanna Ross is currently in the action stage of change. As such, her goal is to continue weight management plan.  She has agreed to: continue current plan   Behavioral Intervention We discussed the following today: Accepting and managing grief, Breanna Ross, work on managing stress, creating time for self-care and relaxation  Additional resources provided today: Handout on [employer] [place]  Evidence-based interventions for health behavior change were utilized today including the discussion of self monitoring techniques, problem-solving barriers and SMART goal setting techniques.   Regarding patient's less desirable eating habits and patterns, we employed the technique of small changes.   Pt will specifically work on: Increase intentional exercise for next visit.    Recommended Physical Activity Goals Breanna Ross has  been advised to work up to 150 minutes of moderate intensity aerobic activity a week and strengthening exercises 2-3 times per week for cardiovascular health, weight loss maintenance and preservation of muscle mass.   She has agreed to :  Increase physical activity in their day and reduce sedentary time (increase NEAT).   Pharmacotherapy We both agreed to : continue with nutritional and behavioral strategies   FOR ASSOCIATED CONDITIONS ADDRESSED TODAY:  Type 2 diabetes mellitus with morbid obesity [facility]) Assessment & Plan: Lab Results  Component Value Date   HGBA1C 5.7 (H) 08/21/2023  Breanna Ross is taking Ozempic 1 mg once weekly. Pt is tolerating medication well, denies any N/V/D. Reviewed last A1c with pt, A1c was 5.7 2 months ago. Reminded Breanna Ross if she feels poorly- check Blood Sugar at that time. Continue following RCNP high in lean protein and low in simple carbs. Will continue to monitor levels.    Vitamin D deficiency Assessment & Plan: Lab Results  Component Value Date   VD25OH 77.3 08/21/2023  Breanna Ross is on Cholecalciferol 5000 units once daily and OTC Calcium/Vit D supplement 3 tablets per day, pt unaware of specific dose. Per last obtained labs, Vit D levels were slightly elevated at 77.3. Encouraged pt to recheck dosage of OTC supplement and stop taking whichever supplement has the most units. I again reiterated the importance of vitamin D (as well as calcium) to their health and wellbeing. Will recheck levels 3 months from last obtained labs.   Relevant Orders: -     Vitamin D3; 4,000IU  Hypertension associated with diabetes [facility]) Assessment & Plan: BP Readings from Last 3 Encounters:  10/29/23 (!) 146/94  10/14/23 129/86  08/21/23 135/83  Breanna Ross is on Norvasc 10 mg daily and Diovan-HCT 320-25 mg once daily. Pt presented with elevated BP today. She is currently grieving the passing of her son which may contribute to the high levels. Encouraged pt to walk, pray,  meditate, read, and more to help manage stress. Continue current medication regimen. Will monitor closely alongside PCP.   Grief at loss of child Assessment & Plan: Breanna Ross's son unfortunately passed away on September 23, 2023 from complication heart failure. She is very emotional today, and reports it has been difficult to sleep well and focus throughout the day. She received Lunesta 2 mg as needed per PCP to aid in sleep. [name] has not tried counseling yet because she is not ready, per pt.   Discussed various anti-depressant and anxiety medications to help get through the day. Pt agrees to start Prozac today, will start 0.5 tablet daily for 4-6 days, if tolerated well increase to 1 tablet daily. Informed pt of all associated potential risks and benefits. Encouraged pt to lean on family and get into counseling to attempt to better manage emotions. Will continue to monitor closely.   Relevant Orders: -     FLUoxetine HCl; Take 1 tablet (10 mg total) by mouth daily.  Dispense: 30 tablet; Refill: 1  Follow up:   Return in about 5 weeks (around 12/04/2023). She was informed of the importance of frequent follow up visits to maximize her success with intensive lifestyle modifications for her multiple health conditions.  Subjective:   Chief complaint: Obesity Breanna Ross is here to discuss her progress with her obesity treatment plan. She is on the the Category 1 Plan and states she is following her eating plan approximately 50% of the time. She states she is not exercising.  Interval History:  Breanna Ross is here for a follow up office visit. Since last OV on 10/14/2023, pt is down 1 lb. She recently lost her son two months ago. Due to current circumstances, pt is eating more off plan. However, Breanna Ross denies skipping any meals.   Pharmacotherapy for weight loss: She is currently taking Ozempic 1 mg once weekly.   Review of Systems:  Pertinent positives were addressed with patient today.  Reviewed by clinician  on day of visit: allergies, medications, problem list, medical history, surgical history, family history, social history, and previous encounter notes.  Weight Summary and Biometrics   Weight Lost Since Last Visit: 1  Weight Gained Since Last Visit: 0   Vitals Temp: 98.3 F (36.8 C) BP: (!) 146/94 Pulse Rate: 82 SpO2: 100 %   Anthropometric Measurements Height: 5\' 6"  (1.676 m) Weight: 242 lb 3.2 oz (109.9 kg) BMI (Calculated): 39.11 Weight at Last Visit: 243 lb Weight Lost Since Last Visit: 1 Weight Gained Since Last Visit: 0 Starting Weight: 265 lb Total Weight Loss (lbs): 23 lb (10.4 kg) Peak Weight: 295 lb   Body Composition  Body Fat %: 47.7 % Fat Mass (lbs): 115.6 lbs Muscle Mass (lbs): 120.2 lbs Total Body Water (lbs): 87.4 lbs Visceral Fat Rating : 16   Other Clinical Data Fasting: No Labs: No Today's Visit #: 10 Starting Date: 03/19/23    Objective:   PHYSICAL EXAM: Blood pressure (!) 146/94, pulse 82, temperature 98.3 F (36.8 C), height 5\' 6"  (1.676 m), weight 242 lb 3.2 oz (109.9 kg), SpO2 100%. Body mass index is 39.09  kg/m.  General: she is overweight, cooperative and in no acute distress. PSYCH: Has normal mood, affect and thought process.   HEENT: EOMI, sclerae are anicteric. Lungs: Normal breathing effort, no conversational dyspnea. Extremities: Moves * 4 Neurologic: A and O * 3, good insight  DIAGNOSTIC DATA REVIEWED: BMET    Component Value Date/Time   NA 142 05/12/2023 0000   K 3.9 05/12/2023 0000   CL 101 05/12/2023 0000   CO2 26 (A) 05/12/2023 0000   GLUCOSE 72 03/19/2023 1103   GLUCOSE 172 (H) 06/11/2016 0433   BUN 17 05/12/2023 0000   CREATININE 0.8 05/12/2023 0000   CREATININE 0.81 03/19/2023 1103   CALCIUM 9.6 05/12/2023 0000   GFRNONAA 64 01/03/2020 1007   GFRAA 74 01/03/2020 1007   Lab Results  Component Value Date   HGBA1C 5.7 (H) 08/21/2023   HGBA1C 6.8 (H) 06/21/2013   Lab Results  Component Value Date    INSULIN 10.6 03/19/2023   Lab Results  Component Value Date   TSH 1.840 03/19/2023   CBC    Component Value Date/Time   WBC 5.8 05/12/2023 0000   WBC 9.4 06/12/2016 0652   RBC 5.21 (A) 05/12/2023 0000   HGB 15.0 05/12/2023 0000   HGB 14.9 03/19/2023 1103   HCT 46 05/12/2023 0000   HCT 45.3 03/19/2023 1103   PLT 297 05/12/2023 0000   PLT 302 03/19/2023 1103   MCV 88 03/19/2023 1103   MCH 28.9 03/19/2023 1103   MCH 27.0 06/12/2016 0652   MCHC 32.9 03/19/2023 1103   MCHC 31.4 06/12/2016 0652   RDW 13.6 03/19/2023 1103   Iron Studies No results found for: "IRON", "TIBC", "FERRITIN", "IRONPCTSAT" Lipid Panel     Component Value Date/Time   CHOL 181 05/12/2023 0000   CHOL 186 03/19/2023 1103   TRIG 104 05/12/2023 0000   HDL 54 05/12/2023 0000   HDL 68 03/19/2023 1103   LDLCALC 108 05/12/2023 0000   LDLCALC 100 (H) 03/19/2023 1103   Hepatic Function Panel     Component Value Date/Time   PROT 7.5 03/19/2023 1103   ALBUMIN 4.4 05/12/2023 0000   ALBUMIN 4.4 03/19/2023 1103   AST 19 05/12/2023 0000   ALT 23 05/12/2023 0000   ALKPHOS 81 05/12/2023 0000   BILITOT 0.3 03/19/2023 1103      Component Value Date/Time   TSH 1.840 03/19/2023 1103   Nutritional Lab Results  Component Value Date   VD25OH 77.3 08/21/2023   VD25OH 115.0 05/12/2023   VD25OH 84.6 03/19/2023    Attestations:   I, Camryn Mix, acting as a Stage manager for [employer], DO., have compiled all relevant documentation for today's office visit on behalf of Breanna Sensor, DO, while in the presence of [employer], DO.  Reviewed by clinician on day of visit: allergies, medications, problem list, medical history, surgical history, family history, social history, and previous encounter notes pertinent to patient's obesity diagnosis. I have spent 40 minutes in the care of the patient today specifically: 30 minutes of face to face counseling on education about strategies to help with grief,  informed about counseling options specific to hospice loss, discussed risks and benefits of medications that may help with depression and anxiety, encouraged to give counseling a try, discussed high vitamin D levels from last labs, preparing to see patient (e.g. review and interpretation of tests, old notes ), obtaining and/or reviewing separately obtained history, performing a medically appropriate examination or evaluation, counseling and educating the patient, ordering  medications, test or procedures, documenting clinical information in the electronic or other health care record, and independently interpreting results and communicating results to the patient, family, or caregiver    I have reviewed the above documentation for accuracy and completeness, and I agree with the above. Breanna Ross, D.O.  The 21st Century Cures Act was signed into law in 2016 which includes the topic of electronic health records.  This provides immediate access to information in MyChart.  This includes consultation notes, operative notes, office notes, lab results and pathology reports.  If you have any questions about what you read please let us  know at your next visit so we can discuss your concerns and take corrective action if need be.  We are right here with you.

## 2023-11-20 ENCOUNTER — Other Ambulatory Visit (INDEPENDENT_AMBULATORY_CARE_PROVIDER_SITE_OTHER): Payer: Self-pay | Admitting: Family Medicine

## 2023-11-24 ENCOUNTER — Other Ambulatory Visit: Payer: Self-pay | Admitting: Obstetrics and Gynecology

## 2023-11-24 DIAGNOSIS — Z1231 Encounter for screening mammogram for malignant neoplasm of breast: Secondary | ICD-10-CM

## 2023-11-25 ENCOUNTER — Other Ambulatory Visit (INDEPENDENT_AMBULATORY_CARE_PROVIDER_SITE_OTHER): Payer: Self-pay | Admitting: Family Medicine

## 2023-12-03 ENCOUNTER — Ambulatory Visit (INDEPENDENT_AMBULATORY_CARE_PROVIDER_SITE_OTHER): Admitting: Family Medicine

## 2023-12-04 ENCOUNTER — Ambulatory Visit (INDEPENDENT_AMBULATORY_CARE_PROVIDER_SITE_OTHER): Admitting: Family Medicine

## 2023-12-04 ENCOUNTER — Encounter (INDEPENDENT_AMBULATORY_CARE_PROVIDER_SITE_OTHER): Payer: Self-pay | Admitting: Family Medicine

## 2023-12-04 DIAGNOSIS — Z6838 Body mass index (BMI) 38.0-38.9, adult: Secondary | ICD-10-CM

## 2023-12-04 DIAGNOSIS — E669 Obesity, unspecified: Secondary | ICD-10-CM

## 2023-12-04 DIAGNOSIS — F4321 Adjustment disorder with depressed mood: Secondary | ICD-10-CM

## 2023-12-04 DIAGNOSIS — E559 Vitamin D deficiency, unspecified: Secondary | ICD-10-CM

## 2023-12-04 DIAGNOSIS — E1159 Type 2 diabetes mellitus with other circulatory complications: Secondary | ICD-10-CM

## 2023-12-04 DIAGNOSIS — I152 Hypertension secondary to endocrine disorders: Secondary | ICD-10-CM

## 2023-12-04 DIAGNOSIS — Z634 Disappearance and death of family member: Secondary | ICD-10-CM

## 2023-12-04 DIAGNOSIS — Z7985 Long-term (current) use of injectable non-insulin antidiabetic drugs: Secondary | ICD-10-CM

## 2023-12-04 DIAGNOSIS — E1169 Type 2 diabetes mellitus with other specified complication: Secondary | ICD-10-CM

## 2023-12-04 MED ORDER — VITAMIN D3 125 MCG (5000 UT) PO CAPS
ORAL_CAPSULE | ORAL | Status: AC
Start: 2023-12-04 — End: ?

## 2023-12-04 MED ORDER — FLUOXETINE HCL 10 MG PO TABS: ORAL_TABLET | ORAL | Status: AC

## 2023-12-04 NOTE — Progress Notes (Signed)
 Breanna Ross, D.O.  ABFM, ABOM Specializing in Clinical Bariatric Medicine  Office located at: [address] [place], [place]  [address]   Assessment and Plan:   Medications Discontinued During This Encounter  Medication Reason   FLUoxetine  (PROZAC ) 10 MG tablet    Cholecalciferol  (VITAMIN D3) 125 MCG (5000 UT) CAPS      Meds ordered this encounter  Medications   Cholecalciferol  (VITAMIN D3) 125 MCG (5000 UT) CAPS    Sig: Decrease from 50mcg (2000) to 25mcg (1000) - take 1 tab every other day   FLUoxetine  (PROZAC ) 10 MG tablet    Sig: 0.5 tabs every other day    FOR THE DISEASE OF OBESITY:  Obesity (BMI 30-39.9), Current 38.76 Morbid obesity (HCC), Starting BMI 43.12 Assessment & Plan: Since last office visit on 10/29/2023 patient's  Muscle mass has decreased by 1.8 lb. Fat mass has not changed (115.6 lbs) Total body water has increased by 1.2 lb.  Counseling done on how various foods will affect these numbers and how to maximize success  Total lbs lost to date: 25 lbs  Total weight loss percentage to date: 9.43%    Recommended Dietary Goals Breanna Ross is currently in the action stage of change. As such, her goal is to continue weight management plan.  She has agreed to: continue current plan   Behavioral Intervention We discussed the following today: celebration eating strategies, the importance of community and doing activities that create new pathways in the brain  Additional resources provided today: Handout on Common Characteristics of Successful Weight Losers, Handout on Calendar Events for Seniors.   Evidence-based interventions for health behavior change were utilized today including the discussion of self monitoring techniques, problem-solving barriers and SMART goal setting techniques.   Regarding patient's less desirable eating habits and patterns, we employed the technique of small changes.   Pt will specifically work on: n/a   Recommended Physical  Activity Goals Breanna Ross has been advised to work up to 300-450 minutes of moderate intensity aerobic activity a week and strengthening exercises 2-3 times per week for cardiovascular health, weight loss maintenance and preservation of muscle mass.   She has agreed to walk 20 minutes 4-5 days a week and consider starting [facility] [facility] [place] or senior center classes/activities.   Emphasized the importance of exercise for the wt loss benefits as well as for her mental health/stress management.    Pharmacotherapy Continue GLP-1 therapy.    ASSOCIATED CONDITIONS ADDRESSED TODAY:  Type 2 diabetes mellitus with morbid obesity (HCC) Assessment & Plan: Most recent A1c and fasting insulin :  Lab Results  Component Value Date   HGBA1C 5.7 (H) 08/21/2023   HGBA1C 5.7 05/12/2023   HGBA1C 6.1 (H) 03/19/2023   INSULIN  10.6 03/19/2023    HgbA1c is at goal for age and comorbid conditions. Denies symptoms of hypoglycemia or hyperglycemia. On Semaglutide  1 mg wkly with good adherence and no side effects. No c/o hunger and cravings. Continue GLP-1 therapy and reduced calorie meal plan low on processed crabs and simple sugars. Ongoing weight loss will improve insulin  resistance and glycemic control   Grief at loss of child Assessment & Plan: Her son Breanna Ross passed away on 11-Sep-2023 from heart failure. States she is doing better emotionally and is having less tearful days. Tries to keep herself busy with various activities. Started her on Prozac  on 4/09; she's been taking 0.5 tablet twice wkly. She is also weaning her self off Lunesta.   Encouraged a  more consistent regimen regarding her Prozac ; she agrees to take 0.5 tablet of her Prozac  every other day. Continue personal strategies.  Extensively discussed the importance of exercise for her mental health and stress management. Consider group senior exercise classes. Consider obtaining counselor in the future.    Hypertension associated with type 2  diabetes mellitus (HCC) Assessment & Plan: Last 3 blood pressure readings in our office are as follows: BP Readings from Last 3 Encounters:  12/04/23 (!) 146/90  10/29/23 (!) 146/94  10/14/23 129/86   The 10-year ASCVD risk score (Arnett DK, et al., 2019) is: 31.3%* (Cholesterol units were assumed)  Lab Results  Component Value Date   CREATININE 0.8 05/12/2023   Reports compliance with Norvasc 10 mg daily and Diovan -HCT 320/25 mg daily. Her BP is above goal today - she attributes this to stress and eating rich foods over mothers day as well as the left overs the following days. Pt asx. Check BP at home 2-3 times wkly. Continue same regimen and following our low salt, heart healthy meal plan and engaging in a regular exercise program extensively discussed.   Vitamin D  deficiency Assessment & Plan: Most recent VD: Lab Results  Component Value Date   VD25OH 77.3 08/21/2023   VD25OH 115.0 05/12/2023   VD25OH 84.6 03/19/2023   Reports compliance with Calcium -Vitamin D  400 lU daily and OTC Vitamin D  2,000 international units daily. Her Vitamin D  levels are  above goal of 50 to 70. Pt instructed to continue Calcium -Vitamin D  and DECREASE her OTC Vitamin D  to 1,000 units every other day. Recheck- future.    Follow up:   Return 01/15/2024 at 2:30 PM with Danford, Barkley Li, NP. She was informed of the importance of frequent follow up visits to maximize her success with intensive lifestyle modifications for her multiple health conditions.  Subjective:   Chief complaint: Obesity Breanna Ross is here to discuss her progress with her obesity treatment plan. She is on the Category 1 Plan. She states she is walking 15 minutes 4 days per week.  Interval History:  Breanna Ross is here for a follow up office visit. Since last OV on 10/29/2023, pt is down 2 lbs. States she is following her eating plan approximately 50% of the time. Reports eating rich foods over mothers day as well as the left overs the  following days.   Her son Breanna Ross passed away on September 08, 2023 from heart failure. States she is doing better emotionally and is having less tearful days. Tries to keep herself busy with various activities. Started her on Prozac  on 4/09; she's been taking 0.5 tablet twice wkly. She is also weaning her self off Lunesta.   Pharmacotherapy that aid with weight loss: She is currently taking Semaglutide  1 mg wkly.   Review of Systems:  Pertinent positives were addressed with patient today.  Reviewed by clinician on day of visit: allergies, medications, problem list, medical history, surgical history, family history, social history, and previous encounter notes.  Weight Summary and Biometrics   Weight Lost Since Last Visit: 2lb  Weight Gained Since Last Visit: 0   Vitals Temp: 98.2 F (36.8 C) BP: (!) 146/90 Pulse Rate: 86 SpO2: 98 %   Anthropometric Measurements Height: 5\' 6"  (1.676 m) Weight: 240 lb (108.9 kg) BMI (Calculated): 38.76 Weight at Last Visit: 242lb Weight Lost Since Last Visit: 2lb Weight Gained Since Last Visit: 0 Starting Weight: 265lb Total Weight Loss (lbs): 25 lb (11.3 kg) Peak Weight: 295lb  Body Composition  Body Fat %: 48.1 % Fat Mass (lbs): 115.6 lbs Muscle Mass (lbs): 118.4 lbs Total Body Water (lbs): 88.6 lbs Visceral Fat Rating : 16   Other Clinical Data Fasting: no Labs: no Today's Visit #: 11 Starting Date: 03/19/23   Objective:   PHYSICAL EXAM: Blood pressure (!) 146/90, pulse 86, temperature 98.2 F (36.8 C), height 5\' 6"  (1.676 m), weight 240 lb (108.9 kg), SpO2 98%. Body mass index is 38.74 kg/m.  General: she is overweight, cooperative and in no acute distress. PSYCH: Has normal mood, affect and thought process.   HEENT: EOMI, sclerae are anicteric. Lungs: Normal breathing effort, no conversational dyspnea. Extremities: Moves * 4 Neurologic: A and O * 3, good insight  DIAGNOSTIC DATA REVIEWED: BMET    Component Value  Date/Time   NA 142 05/12/2023 0000   K 3.9 05/12/2023 0000   CL 101 05/12/2023 0000   CO2 26 (A) 05/12/2023 0000   GLUCOSE 72 03/19/2023 1103   GLUCOSE 172 (H) 06/11/2016 0433   BUN 17 05/12/2023 0000   CREATININE 0.8 05/12/2023 0000   CREATININE 0.81 03/19/2023 1103   CALCIUM  9.6 05/12/2023 0000   GFRNONAA 64 01/03/2020 1007   GFRAA 74 01/03/2020 1007   Lab Results  Component Value Date   HGBA1C 5.7 (H) 08/21/2023   HGBA1C 6.8 (H) 06/21/2013   Lab Results  Component Value Date   INSULIN  10.6 03/19/2023   Lab Results  Component Value Date   TSH 1.840 03/19/2023   CBC    Component Value Date/Time   WBC 5.8 05/12/2023 0000   WBC 9.4 06/12/2016 0652   RBC 5.21 (A) 05/12/2023 0000   HGB 15.0 05/12/2023 0000   HGB 14.9 03/19/2023 1103   HCT 46 05/12/2023 0000   HCT 45.3 03/19/2023 1103   PLT 297 05/12/2023 0000   PLT 302 03/19/2023 1103   MCV 88 03/19/2023 1103   MCH 28.9 03/19/2023 1103   MCH 27.0 06/12/2016 0652   MCHC 32.9 03/19/2023 1103   MCHC 31.4 06/12/2016 0652   RDW 13.6 03/19/2023 1103   Iron Studies No results found for: "IRON", "TIBC", "FERRITIN", "IRONPCTSAT" Lipid Panel     Component Value Date/Time   CHOL 181 05/12/2023 0000   CHOL 186 03/19/2023 1103   TRIG 104 05/12/2023 0000   HDL 54 05/12/2023 0000   HDL 68 03/19/2023 1103   LDLCALC 108 05/12/2023 0000   LDLCALC 100 (H) 03/19/2023 1103   Hepatic Function Panel     Component Value Date/Time   PROT 7.5 03/19/2023 1103   ALBUMIN 4.4 05/12/2023 0000   ALBUMIN 4.4 03/19/2023 1103   AST 19 05/12/2023 0000   ALT 23 05/12/2023 0000   ALKPHOS 81 05/12/2023 0000   BILITOT 0.3 03/19/2023 1103      Component Value Date/Time   TSH 1.840 03/19/2023 1103   Nutritional Lab Results  Component Value Date   VD25OH 77.3 08/21/2023   VD25OH 115.0 05/12/2023   VD25OH 84.6 03/19/2023    Attestations:   I, Special Puri, acting as a Stage manager for [employer], DO., have compiled all  relevant documentation for today's office visit on behalf of Marceil Sensor, DO, while in the presence of [employer], DO.  I have spent 42 minutes in the care of the patient today including: preparing to see patient (e.g. review and interpretation of tests, old notes ), obtaining and/or reviewing separately obtained history, performing a medically appropriate examination or evaluation, counseling and educating the  patient, ordering medications, test or procedures, documenting clinical information in the electronic or other health care record, and independently interpreting results and communicating results to the patient, family, or caregiver   I have reviewed the above documentation for accuracy and completeness, and I agree with the above. Breanna Ross, D.O.  The 21st Century Cures Act was signed into law in 2016 which includes the topic of electronic health records.  This provides immediate access to information in MyChart.  This includes consultation notes, operative notes, office notes, lab results and pathology reports.  If you have any questions about what you read please let us  know at your next visit so we can discuss your concerns and take corrective action if need be.  We are right here with you.

## 2023-12-23 ENCOUNTER — Ambulatory Visit
Admission: RE | Admit: 2023-12-23 | Discharge: 2023-12-23 | Disposition: A | Discharge status: Home / Self Care | Source: Ambulatory Visit | Attending: Obstetrics and Gynecology | Admitting: Obstetrics and Gynecology

## 2023-12-23 DIAGNOSIS — Z1231 Encounter for screening mammogram for malignant neoplasm of breast: Secondary | ICD-10-CM

## 2024-01-15 ENCOUNTER — Ambulatory Visit (INDEPENDENT_AMBULATORY_CARE_PROVIDER_SITE_OTHER): Admitting: Adult Health

## 2024-01-15 ENCOUNTER — Encounter (INDEPENDENT_AMBULATORY_CARE_PROVIDER_SITE_OTHER): Payer: Self-pay | Admitting: Adult Health

## 2024-01-15 VITALS — BP 139/89 | HR 86 | Temp 98.2°F | Ht 66.0 in | Wt 241.0 lb

## 2024-01-15 DIAGNOSIS — E1159 Type 2 diabetes mellitus with other circulatory complications: Secondary | ICD-10-CM | POA: Diagnosis not present

## 2024-01-15 DIAGNOSIS — E669 Obesity, unspecified: Secondary | ICD-10-CM

## 2024-01-15 DIAGNOSIS — I152 Hypertension secondary to endocrine disorders: Secondary | ICD-10-CM | POA: Diagnosis not present

## 2024-01-15 DIAGNOSIS — F4320 Adjustment disorder, unspecified: Secondary | ICD-10-CM | POA: Diagnosis not present

## 2024-01-15 DIAGNOSIS — E1169 Type 2 diabetes mellitus with other specified complication: Secondary | ICD-10-CM | POA: Diagnosis not present

## 2024-01-15 DIAGNOSIS — Z7985 Long-term (current) use of injectable non-insulin antidiabetic drugs: Secondary | ICD-10-CM

## 2024-01-15 DIAGNOSIS — Z6838 Body mass index (BMI) 38.0-38.9, adult: Secondary | ICD-10-CM

## 2024-01-15 MED ORDER — SEMAGLUTIDE (1 MG/DOSE) 4 MG/3ML ~~LOC~~ SOPN: 1.0000 mg | PEN_INJECTOR | SUBCUTANEOUS | 0 refills | Status: DC

## 2024-01-15 NOTE — Progress Notes (Signed)
 WEIGHT SUMMARY AND BIOMETRICS  Vitals Temp: 98.2 F (36.8 C) BP: 139/89 Pulse Rate: 86 SpO2: 98 %   Anthropometric Measurements Height: 5' 6 (1.676 m) Weight: 241 lb (109.3 kg) BMI (Calculated): 38.92 Weight at Last Visit: 240 lb Weight Lost Since Last Visit: 0 Weight Gained Since Last Visit: 1 lb Starting Weight: 265 lb Total Weight Loss (lbs): 24 lb (10.9 kg) Peak Weight: 295 lb   Body Composition  Body Fat %: 49.1 % Fat Mass (lbs): 118.4 lbs Muscle Mass (lbs): 116.6 lbs Total Body Water (lbs): 92.4 lbs Visceral Fat Rating : 16   Other Clinical Data Fasting: No Labs: No Today's Visit #: 12 Starting Date: 03/19/23    Chief Complaint:   OBESITY Breanna Ross is here to discuss her progress with her obesity treatment plan.  She is on the the Category 1 Plan and states she is following her eating plan approximately 30 % of the time.  She states she is exercising Walking 30 minutes 3 times per week.  Interim History:  [name] has been on Ozempic  1mg  since on/about 08/28/2023 Denies mass in neck, dysphagia, dyspepsia, persistent hoarseness, abdominal pain, or N/V/C   Her eldest sister, [name], is experiencing FTT. She is currently hospitalized and will be transferred to Hospice ASAP. [name] is surrounded by many friends and family   Subjective:   1. Type 2 diabetes mellitus with morbid obesity (HCC) Lab Results  Component Value Date   HGBA1C 5.7 (H) 08/21/2023   HGBA1C 5.7 05/12/2023   HGBA1C 6.1 (H) 03/19/2023   She is not checking CBG at home, however she denies N/V/Muscle Weakness [name] has been on Ozempic  1mg  since on/about 08/28/2023 Denies mass in neck, dysphagia, dyspepsia, persistent hoarseness, abdominal pain, or N/V/C   2. Hypertension associated with type 2 diabetes mellitus (HCC) BP stable at OV valsartan -hydrochlorothiazide  (DIOVAN -HCT) 320-25 MG per tablet  colesevelam  (WELCHOL ) 625 MG tablet  omega-3 acid ethyl esters (LOVAZA) 1 g  capsule  amLODipine (NORVASC) 10 MG tablet  aspirin  EC 81 MG tablet   3. Grief reaction In the past year: Her brother in law passed away 2025-01-17Her son passed away from compliations of CHF 09/04/2023 Her eldest sister, [name], is experiencing FTT. She is currently hospitalized and will be transferred to Hospice ASAP. [name] is surrounded by many friends and family   Ms. Durio endorses stable mood, denies SI/HI  Assessment/Plan:   1. Type 2 diabetes mellitus with morbid obesity (HCC) Refill - Semaglutide , 1 MG/DOSE, 4 MG/3ML SOPN; Inject 1 mg as directed once a week.  Dispense: 9 mL; Refill: 0  2. Hypertension associated with type 2 diabetes mellitus (HCC) (Primary) Continue  valsartan -hydrochlorothiazide  (DIOVAN -HCT) 320-25 MG per tablet  colesevelam  (WELCHOL ) 625 MG tablet  omega-3 acid ethyl esters (LOVAZA) 1 g capsule  amLODipine (NORVASC) 10 MG tablet  aspirin  EC 81 MG tablet   3. Grief reaction Remain well hydrated and consume healthy meals to remain fueled. Eating Out Guide provided to pt. Surround self with supportive family/friends  4. Obesity (BMI 30-39.9), Current 38.9  [name] is currently in the action stage of change. As such, her goal is to continue with weight loss efforts. She has agreed to the Category 1 Plan.   Exercise goals: Older adults should follow the adult guidelines. When older adults cannot meet the adult guidelines, they should be as physically active as their abilities and conditions will allow.  Older adults should do exercises that maintain or improve balance  if they are at risk of falling.  Older adults should determine their level of effort for physical activity relative to their level of fitness.  Older adults with chronic conditions should understand whether and how their conditions affect their ability to do regular physical activity safely.  Behavioral modification strategies: increasing lean protein intake, decreasing simple carbohydrates,  increasing vegetables, increasing water intake, no skipping meals, meal planning and cooking strategies, keeping healthy foods in the home, ways to avoid boredom eating, and planning for success.  [place] has agreed to follow-up with our clinic in 4 weeks. She was informed of the importance of frequent follow-up visits to maximize her success with intensive lifestyle modifications for her multiple health conditions.   Objective:   Blood pressure 139/89, pulse 86, temperature 98.2 F (36.8 C), height 5' 6 (1.676 m), weight 241 lb (109.3 kg), SpO2 98%. Body mass index is 38.9 kg/m.  General: Cooperative, alert, well developed, in no acute distress. HEENT: Conjunctivae and lids unremarkable. Cardiovascular: Regular rhythm.  Lungs: Normal work of breathing. Neurologic: No focal deficits.   Lab Results  Component Value Date   CREATININE 0.8 05/12/2023   BUN 17 05/12/2023   NA 142 05/12/2023   K 3.9 05/12/2023   CL 101 05/12/2023   CO2 26 (A) 05/12/2023   Lab Results  Component Value Date   ALT 23 05/12/2023   AST 19 05/12/2023   ALKPHOS 81 05/12/2023   BILITOT 0.3 03/19/2023   Lab Results  Component Value Date   HGBA1C 5.7 (H) 08/21/2023   HGBA1C 5.7 05/12/2023   HGBA1C 6.1 (H) 03/19/2023   HGBA1C 6.8 (H) 06/10/2016   HGBA1C 6.9 (H) 05/31/2016   Lab Results  Component Value Date   INSULIN  10.6 03/19/2023   Lab Results  Component Value Date   TSH 1.840 03/19/2023   Lab Results  Component Value Date   CHOL 181 05/12/2023   HDL 54 05/12/2023   LDLCALC 108 05/12/2023   TRIG 104 05/12/2023   Lab Results  Component Value Date   VD25OH 77.3 08/21/2023   VD25OH 115.0 05/12/2023   VD25OH 84.6 03/19/2023   Lab Results  Component Value Date   WBC 5.8 05/12/2023   HGB 15.0 05/12/2023   HCT 46 05/12/2023   MCV 88 03/19/2023   PLT 297 05/12/2023   No results found for: IRON, TIBC, FERRITIN  Attestation Statements:   Reviewed by clinician on day of visit:  allergies, medications, problem list, medical history, surgical history, family history, social history, and previous encounter notes.  I have reviewed the above documentation for accuracy and completeness, and I agree with the above. -  Hiliana Eilts d. Glennis Montenegro, NP-C

## 2024-02-16 ENCOUNTER — Encounter (INDEPENDENT_AMBULATORY_CARE_PROVIDER_SITE_OTHER): Payer: Self-pay | Admitting: Adult Health

## 2024-02-16 ENCOUNTER — Ambulatory Visit (INDEPENDENT_AMBULATORY_CARE_PROVIDER_SITE_OTHER): Admitting: Adult Health

## 2024-02-16 DIAGNOSIS — E1159 Type 2 diabetes mellitus with other circulatory complications: Secondary | ICD-10-CM | POA: Diagnosis not present

## 2024-02-16 DIAGNOSIS — F4320 Adjustment disorder, unspecified: Secondary | ICD-10-CM | POA: Diagnosis not present

## 2024-02-16 DIAGNOSIS — Z7985 Long-term (current) use of injectable non-insulin antidiabetic drugs: Secondary | ICD-10-CM

## 2024-02-16 DIAGNOSIS — I152 Hypertension secondary to endocrine disorders: Secondary | ICD-10-CM

## 2024-02-16 DIAGNOSIS — E669 Obesity, unspecified: Secondary | ICD-10-CM

## 2024-02-16 DIAGNOSIS — E1169 Type 2 diabetes mellitus with other specified complication: Secondary | ICD-10-CM | POA: Diagnosis not present

## 2024-02-16 DIAGNOSIS — Z6838 Body mass index (BMI) 38.0-38.9, adult: Secondary | ICD-10-CM

## 2024-02-16 NOTE — Progress Notes (Signed)
 WEIGHT SUMMARY AND BIOMETRICS  Vitals Temp: 98.4 F (36.9 C) BP: 112/68 Pulse Rate: 83 SpO2: 99 %   Anthropometric Measurements Height: 5' 6 (1.676 m) Weight: 240 lb (108.9 kg) BMI (Calculated): 38.76 Weight at Last Visit: 241 lb Weight Lost Since Last Visit: 1 lb Weight Gained Since Last Visit: 0 Starting Weight: 265 lb Total Weight Loss (lbs): 25 lb (11.3 kg) Peak Weight: 295 lb   Body Composition  Body Fat %: 49.1 % Fat Mass (lbs): 117.8 lbs Muscle Mass (lbs): 116 lbs Total Body Water (lbs): 92 lbs Visceral Fat Rating : 16   Other Clinical Data Fasting: no Labs: no Today's Visit #: 13 Starting Date: 03/19/23    Chief Complaint:   OBESITY Breanna Ross is here to discuss her progress with her obesity treatment plan. She is on the the Category 1 Plan and states she is following her eating plan approximately 0 % of the time.  She states she is exercising Walking 15 minutes 3 times per week.   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 has been on Ozempic  1mg  since on/about 08/28/2023 Denies mass in neck, dysphagia, dyspepsia, persistent hoarseness, abdominal pain, or N/V/C   Exercise-Walking 15 mins 3 x week  Hydration-She estimates to drink 4 glasses of water per day  Ms. Cerrato is one of 10 siblings ( 2 brother and 1 sister have passed away). Her son passed away 2025/03/05She one remaining child: step daughter 5 grandchildren 2 great grandchildren  Subjective:   1. Type 2 diabetes mellitus with morbid obesity (HCC) Lab Results  Component Value Date   HGBA1C 5.7 (H) 08/21/2023   HGBA1C 5.7 05/12/2023   HGBA1C 6.1 (H) 03/19/2023    She is not checking home CBG She denies sx's of  hypoglycemia [name] has been on Ozempic  1mg  since on/about 08/28/2023 Denies mass in neck, dysphagia, dyspepsia, persistent hoarseness, abdominal pain, or N/V/C   2. Hypertension associated with type 2 diabetes mellitus (HCC) BP recheck, reading at goal She denies CP with exertion  3. Grief reaction Her 66 year old son, Evalene, passed from complications of CHF 2025-03-05Her sister, [name], passed away earlier this month from complications of T2D/Renal Failure She has been eating out more, as her family/friends want to take her out. She endorses large, supportive local support system. She denies SI/HI  Assessment/Plan:   1. Type 2 diabetes mellitus with morbid obesity (HCC) (Primary) Continue healthy eating, regular walking, and weekly GLP-1 therapy She does not require refill  2. Hypertension associated with type 2 diabetes mellitus (HCC) Increase compliance on prescribed Cat 1 MP Continue regular walking  3. Grief reaction Continue regular walking Continue to spend time with supportive family/friends  4. Obesity (BMI 30-39.9), Current 38.7  Tamorah is not currently in the action stage of change. As such, her goal is to get back to weightloss efforts . She has agreed to the Category 1 Plan.   Exercise goals: Older adults should follow the adult guidelines. When older adults cannot meet the adult guidelines, they should be as physically active as their abilities and conditions will allow.  Older adults should do exercises that maintain or improve balance if they are at risk of falling.  Older adults  should determine their level of effort for physical activity relative to their level of fitness.  Older adults with chronic conditions should understand whether and how their conditions affect their ability to do regular physical activity safely.  Behavioral modification strategies: increasing lean protein intake, decreasing simple carbohydrates, increasing vegetables, increasing water  intake, meal planning and cooking strategies, keeping healthy foods in the home, ways to avoid boredom eating, ways to avoid night time snacking, and planning for success.  Poet has agreed to follow-up with our clinic in 4 weeks. She was informed of the importance of frequent follow-up visits to maximize her success with intensive lifestyle modifications for her multiple health conditions.   Check Fasting Labs at next OV  Objective:   Blood pressure 112/68, pulse 83, temperature 98.4 F (36.9 C), height 5' 6 (1.676 m), weight 240 lb (108.9 kg), SpO2 99%. Body mass index is 38.74 kg/m.  General: Cooperative, alert, well developed, in no acute distress. HEENT: Conjunctivae and lids unremarkable. Cardiovascular: Regular rhythm.  Lungs: Normal work of breathing. Neurologic: No focal deficits.   Lab Results  Component Value Date   CREATININE 0.8 05/12/2023   BUN 17 05/12/2023   NA 142 05/12/2023   K 3.9 05/12/2023   CL 101 05/12/2023   CO2 26 (A) 05/12/2023   Lab Results  Component Value Date   ALT 23 05/12/2023   AST 19 05/12/2023   ALKPHOS 81 05/12/2023   BILITOT 0.3 03/19/2023   Lab Results  Component Value Date   HGBA1C 5.7 (H) 08/21/2023   HGBA1C 5.7 05/12/2023   HGBA1C 6.1 (H) 03/19/2023   HGBA1C 6.8 (H) 06/10/2016   HGBA1C 6.9 (H) 05/31/2016   Lab Results  Component Value Date   INSULIN  10.6 03/19/2023   Lab Results  Component Value Date   TSH 1.840 03/19/2023   Lab Results  Component Value Date   CHOL 181 05/12/2023   HDL 54 05/12/2023   LDLCALC 108 05/12/2023   TRIG 104 05/12/2023   Lab Results  Component Value Date   VD25OH 77.3 08/21/2023   VD25OH 115.0 05/12/2023   VD25OH 84.6 03/19/2023   Lab Results  Component Value Date   WBC 5.8 05/12/2023   HGB 15.0 05/12/2023   HCT 46 05/12/2023   MCV 88 03/19/2023   PLT 297 05/12/2023   No results found for: IRON, TIBC, FERRITIN  Attestation Statements:   Reviewed by clinician on day of  visit: allergies, medications, problem list, medical history, surgical history, family history, social history, and previous encounter notes.  Time spent on visit including pre-visit chart review and post-visit care and charting was 26 minutes.   I have reviewed the above documentation for accuracy and completeness, and I agree with the above. - Koven Belinsky d. Suhail Peloquin, NP-C

## 2024-03-24 ENCOUNTER — Ambulatory Visit
Admission: EM | Admit: 2024-03-24 | Discharge: 2024-03-24 | Disposition: A | Discharge status: Home / Self Care | Attending: Physician Assistant | Admitting: Physician Assistant

## 2024-03-24 DIAGNOSIS — R7302 Impaired glucose tolerance (oral): Secondary | ICD-10-CM | POA: Insufficient documentation

## 2024-03-24 DIAGNOSIS — R140 Abdominal distension (gaseous): Secondary | ICD-10-CM | POA: Insufficient documentation

## 2024-03-24 DIAGNOSIS — J069 Acute upper respiratory infection, unspecified: Secondary | ICD-10-CM | POA: Diagnosis not present

## 2024-03-24 DIAGNOSIS — E559 Vitamin D deficiency, unspecified: Secondary | ICD-10-CM | POA: Insufficient documentation

## 2024-03-24 DIAGNOSIS — G4730 Sleep apnea, unspecified: Secondary | ICD-10-CM | POA: Insufficient documentation

## 2024-03-24 DIAGNOSIS — E785 Hyperlipidemia, unspecified: Secondary | ICD-10-CM | POA: Insufficient documentation

## 2024-03-24 DIAGNOSIS — R141 Gas pain: Secondary | ICD-10-CM | POA: Insufficient documentation

## 2024-03-24 DIAGNOSIS — K600 Acute anal fissure: Secondary | ICD-10-CM | POA: Insufficient documentation

## 2024-03-24 DIAGNOSIS — R059 Cough, unspecified: Secondary | ICD-10-CM | POA: Diagnosis present

## 2024-03-24 DIAGNOSIS — I10 Essential (primary) hypertension: Secondary | ICD-10-CM | POA: Insufficient documentation

## 2024-03-24 DIAGNOSIS — E119 Type 2 diabetes mellitus without complications: Secondary | ICD-10-CM | POA: Insufficient documentation

## 2024-03-24 DIAGNOSIS — E669 Obesity, unspecified: Secondary | ICD-10-CM | POA: Insufficient documentation

## 2024-03-24 DIAGNOSIS — R519 Headache, unspecified: Secondary | ICD-10-CM | POA: Diagnosis present

## 2024-03-24 DIAGNOSIS — J309 Allergic rhinitis, unspecified: Secondary | ICD-10-CM | POA: Insufficient documentation

## 2024-03-24 DIAGNOSIS — M179 Osteoarthritis of knee, unspecified: Secondary | ICD-10-CM | POA: Insufficient documentation

## 2024-03-24 DIAGNOSIS — K601 Chronic anal fissure: Secondary | ICD-10-CM | POA: Insufficient documentation

## 2024-03-24 DIAGNOSIS — Z1211 Encounter for screening for malignant neoplasm of colon: Secondary | ICD-10-CM | POA: Insufficient documentation

## 2024-03-24 DIAGNOSIS — K5730 Diverticulosis of large intestine without perforation or abscess without bleeding: Secondary | ICD-10-CM | POA: Insufficient documentation

## 2024-03-24 DIAGNOSIS — K219 Gastro-esophageal reflux disease without esophagitis: Secondary | ICD-10-CM | POA: Insufficient documentation

## 2024-03-24 DIAGNOSIS — K625 Hemorrhage of anus and rectum: Secondary | ICD-10-CM | POA: Insufficient documentation

## 2024-03-24 DIAGNOSIS — J029 Acute pharyngitis, unspecified: Secondary | ICD-10-CM | POA: Diagnosis present

## 2024-03-24 LAB — POC SOFIA SARS ANTIGEN FIA: SARS Coronavirus 2 Ag: NEGATIVE

## 2024-03-24 LAB — POCT RAPID STREP A (OFFICE): Rapid Strep A Screen: NEGATIVE

## 2024-03-24 MED ORDER — BENZONATATE 100 MG PO CAPS: 100.0000 mg | ORAL_CAPSULE | Freq: Three times a day (TID) | ORAL | 0 refills | Status: AC

## 2024-03-24 NOTE — ED Provider Notes (Signed)
 [facility]    CSN: [id number] Arrival date & time: 03/24/24  1122      History   Chief Complaint Chief Complaint  Patient presents with   Sore Throat   Cough   Headache    HPI Breanna Ross is a 73 y.o. female.   Patient here today for evaluation of sore throat that started a few days ago.  She reports that she has also had some cough and has some rib pain from coughing.  She states she has now developed a headache as well at times.  She denies any fever.  The history is provided by the patient.  Sore Throat Associated symptoms include headaches. Pertinent negatives include no abdominal pain and no shortness of breath.  Cough Associated symptoms: headaches and sore throat   Associated symptoms: no chills, no ear pain, no eye discharge, no fever, no shortness of breath and no wheezing   Headache Associated symptoms: congestion, cough and sore throat   Associated symptoms: no abdominal pain, no diarrhea, no ear pain, no fever, no nausea and no vomiting     Past Medical History:  Diagnosis Date   Arthritis    BIlateral Knees (06/10/2016)   Asthma    COPD (chronic obstructive pulmonary disease) (HCC)    Emphysema of lung (HCC)    Food allergy    GERD (gastroesophageal reflux disease)    Hypercholesteremia    Hypertension    Joint pain    Lactose intolerance    Primary localized osteoarthritis of right knee    Shortness of breath    with exertion   Sleep apnea    unable to use cpap (06/10/2016)   Type 2 diabetes mellitus (HCC)    Vitamin D  deficiency     Patient Active Problem List   Diagnosis Date Noted   Morbid obesity due to excess calories (HCC) 03/24/2024   Bloating 03/24/2024   Allergic rhinitis 03/24/2024   Benign essential hypertension 03/24/2024   Acute anal fissure 03/24/2024   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   Colon cancer screening 03/24/2024   Diverticulosis of colon 03/24/2024   Flatulence, eructation and gas pain 03/24/2024    Hemorrhage of anus and rectum 03/24/2024   Rectal bleeding 03/24/2024   Hyperlipidemia 03/24/2024   Morbid (severe) obesity due to excess calories (HCC) 03/24/2024   Obesity 03/24/2024   Sleep apnea 03/24/2024   Vitamin D  deficiency 03/24/2024   Impaired glucose tolerance 03/24/2024   Type 2 diabetes mellitus without complication (HCC) 03/24/2024   Gastroesophageal reflux disease 03/24/2024   Osteoarthritis of knee 03/24/2024   Other constipation 03/31/2023   Hyperglycemia due to type 2 diabetes mellitus (HCC) 12/03/2021   Chronic bronchitis (HCC) 06/18/2021   Acquired trigger finger of right middle finger 01/17/2021   Acquired trigger finger of right little finger 09/27/2020   Acquired trigger finger of right ring finger 09/27/2020   Tenosynovitis of fingers 08/30/2020   Small fiber neuropathy 07/23/2018   Aftercare 06/17/2018   Encounter for other orthopedic aftercare 11/04/2017   Bilateral carpal tunnel syndrome 10/08/2017   Pain in right hand 10/08/2017   Pain of left hand 10/08/2017   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   Abnormal glucose level 05/17/2015   Asthma without status asthmaticus 04/19/2015   Primary localized osteoarthritis of right knee 03/23/2014   Left knee DJD 06/21/2013   DJD (degenerative joint disease) of knee 06/21/2013   S/P left total knee arthroplasty 06/21/2013   Hypertension  GERD (gastroesophageal reflux disease)    Diabetes (HCC)     Past Surgical History:  Procedure Laterality Date   COLONOSCOPY     HEMORRHOID SURGERY     JOINT REPLACEMENT     KNEE ARTHROSCOPY W/ MENISCECTOMY Left 09-29-09   REDUCTION MAMMAPLASTY Bilateral 1998   SHOULDER ARTHROSCOPY W/ ROTATOR CUFF REPAIR Bilateral 2007   TOTAL KNEE ARTHROPLASTY Left 06/21/2013   Dr Jane   TOTAL KNEE ARTHROPLASTY Left 06/21/2013   Procedure: TOTAL KNEE ARTHROPLASTY- left;  Surgeon: Lamar DELENA Jane, MD;  Location: [facility] OR;  Service: Orthopedics;   Laterality: Left;   TOTAL KNEE ARTHROPLASTY Right 04/11/2014   Procedure: TOTAL KNEE ARTHROPLASTY;  Surgeon: Lamar DELENA Jane, MD;  Location: [facility] OR;  Service: Orthopedics;  Laterality: Right;   TOTAL KNEE REVISION Left 06/10/2016   Procedure: TOTAL KNEE REVISION;  Surgeon: Dempsey Sensor, MD;  Location: [facility] OR;  Service: Orthopedics;  Laterality: Left;   TUBAL LIGATION      OB History   No obstetric history on file.      Home Medications    Prior to Admission medications   Medication Sig Start Date End Date Taking? Authorizing Provider  acetaminophen  (TYLENOL ) 500 MG tablet Take 1,000 mg by mouth 2 (two) times daily as needed.    Yes [provider]  albuterol  (PROVENTIL  HFA;VENTOLIN  HFA) 108 (90 BASE) MCG/ACT inhaler Inhale 2 puffs into the lungs every 6 (six) hours as needed for wheezing or shortness of breath.    Yes [provider]  amLODipine (NORVASC) 10 MG tablet Take 10 mg by mouth daily.    Yes [provider]  aspirin  EC 81 MG tablet Take 81 mg by mouth daily.   Yes [provider]  benzonatate  (TESSALON ) 100 MG capsule Take 1 capsule (100 mg total) by mouth every 8 (eight) hours. 03/24/24  Yes Billy Asberry FALCON, PA-C  betamethasone  dipropionate (DIPROLENE ) 0.05 % ointment Apply topically as needed.   Yes [provider]  budesonide -formoterol  (SYMBICORT ) 160-4.5 MCG/ACT inhaler INHALE 2 PUFFS BY MOUTH INTO THE LUNGS DAILY 03/12/23  Yes Kara Dorn NOVAK, MD  Calcium  Carb-Cholecalciferol  (CALCIUM  + D3 PO) Take 1 tablet by mouth 2 (two) times daily.   Yes [provider]  cetirizine (ZYRTEC) 10 MG tablet Take 10 mg by mouth daily as needed for allergies.    Yes [provider]  chlorpheniramine (CHLOR-TRIMETON) 4 MG tablet Take 4 mg by mouth 2 (two) times daily as needed for allergies.   Yes [provider]  Cholecalciferol  (VITAMIN D3) 125 MCG (5000 UT) CAPS Decrease from 50mcg (2000) to 25mcg (1000) - take 1 tab  every other day 12/04/23  Yes Opalski, Barnie, DO  Coenzyme Q10 (CO Q 10 PO) Take 1 tablet by mouth daily.   Yes [provider]  colesevelam  (WELCHOL ) 625 MG tablet Take 1,875 mg by mouth 2 (two) times daily with a meal.    Yes [provider]  cyanocobalamin  2000 MCG tablet Take 2,500 mcg by mouth daily.   Yes [provider]  diclofenac sodium (VOLTAREN) 1 % GEL Apply 2 g topically 3 (three) times daily as needed (pain).   Yes [provider]  dilTIAZem HCl (CARDIZEM PO) APPLY PR TID; Duration: 30 DAYS 03/18/17  Yes [provider]  esomeprazole (NEXIUM) 40 MG capsule Take 40 mg by mouth daily before breakfast.   Yes [provider]  eszopiclone (LUNESTA) 2 MG TABS tablet Take 2 mg by mouth at bedtime as  needed for sleep. Take immediately before bedtime   Yes [provider]  latanoprost (XALATAN) 0.005 % ophthalmic solution 1 drop at bedtime. 05/04/21  Yes [provider]  Magnesium  400 MG CAPS Take 400 mg by mouth every evening.   Yes [provider]  Misc Natural Products (IMMUNE ESSENTIALS PO) Take by mouth.   Yes [provider]  Multiple Vitamins-Minerals (MULTIVITAMIN WITH MINERALS) tablet Take 1 tablet by mouth daily. Alive   Yes [provider]  omega-3 acid ethyl esters (LOVAZA) 1 g capsule Take by mouth 2 (two) times daily.   Yes [provider]  polyethylene glycol (MIRALAX  / GLYCOLAX ) 17 g packet Take 17 g by mouth 2 (two) times daily. Until stooling regularly, then once daily or PRN 07/31/23  Yes Opalski, Barnie, DO  Probiotic Product (PROBIOTIC BLEND PO) Take by mouth with breakfast, with lunch, and with evening meal.   Yes [provider]  Semaglutide , 1 MG/DOSE, 4 MG/3ML SOPN Inject 1 mg as directed once a week. 01/15/24  Yes Danford, Rockie BIRCH, NP  UNABLE TO FIND Med Name: Natures Bounty Hair, Skin and Nails   Yes [provider]  UNABLE TO FIND Med Name: super  beets   Yes [provider]  UNABLE TO FIND Med Name: cruciferious veggies   Yes [provider]  valsartan -hydrochlorothiazide  (DIOVAN -HCT) 320-25 MG per tablet Take 1 tablet by mouth daily.   Yes [provider]  FLUoxetine  (PROZAC ) 10 MG tablet 0.5 tabs every other day 12/04/23   Midge Barnie, DO    Family History Family History  Problem Relation Age of Onset   Hypertension Mother    Stroke Mother    Hyperlipidemia Mother    Heart disease Mother    Hypertension Father    Heart attack Father    Hyperlipidemia Father    Heart disease Father    Hypertension Sister    Hypertension Sister    Hypertension Sister    Hypertension Sister    Hypertension Sister    Diabetes Sister    Stroke Brother    Diabetes Brother    Diabetes Brother    Diabetes Brother    Breast cancer Neg Hx     Social History Social History   Tobacco Use   Smoking status: Former    Current packs/day: 0.00    Average packs/day: 1 pack/day for 37.0 years (37.0 ttl pk-yrs)    Types: Cigarettes    Start date: 06/08/1969    Quit date: 06/08/2006    Years since quitting: 17.8   Smokeless tobacco: Never  Vaping Use   Vaping status: Never Used  Substance Use Topics   Alcohol  use: Yes    Comment: 06/10/2016 might have a drink once/year   Drug use: No     Allergies   Hydroxyprogesterone, Oxycodone , Penicillins, and Dilaudid  [hydromorphone  hcl]   Review of Systems Review of Systems  Constitutional:  Negative for chills and fever.  HENT:  Positive for congestion and sore throat. Negative for ear pain.   Eyes:  Negative for discharge and redness.  Respiratory:  Positive for cough. Negative for shortness of breath and wheezing.   Gastrointestinal:  Negative for abdominal pain, diarrhea, nausea and vomiting.  Neurological:  Positive for headaches.     Physical Exam Triage Vital Signs ED Triage Vitals  Encounter Vitals Group     BP 03/24/24 1236 (!) 150/90      Girls Systolic BP Percentile --      Girls  Diastolic BP Percentile --      Boys Systolic BP Percentile --      Boys Diastolic BP Percentile --      Pulse Rate 03/24/24 1236 78     Resp 03/24/24 1236 20     Temp 03/24/24 1236 97.8 F (36.6 C)     Temp Source 03/24/24 1236 Oral     SpO2 03/24/24 1236 98 %     Weight 03/24/24 1231 242 lb (109.8 kg)     Height 03/24/24 1231 5' 6 (1.676 m)     Head Circumference --      Peak Flow --      Pain Score 03/24/24 1230 5     Pain Loc --      Pain Education --      Exclude from Growth Chart --    No data found.  Updated Vital Signs BP (!) 150/90 (BP Location: Right Arm)   Pulse 78   Temp 97.8 F (36.6 C) (Oral)   Resp 20   Ht 5' 6 (1.676 m)   Wt 242 lb (109.8 kg)   SpO2 98%   BMI 39.06 kg/m   Visual Acuity Right Eye Distance:   Left Eye Distance:   Bilateral Distance:    Right Eye Near:   Left Eye Near:    Bilateral Near:     Physical Exam Vitals and nursing note reviewed.  Constitutional:      General: She is not in acute distress.    Appearance: Normal appearance. She is not ill-appearing.  HENT:     Head: Normocephalic and atraumatic.     Nose: Congestion present.     Mouth/Throat:     Mouth: Mucous membranes are moist.     Pharynx: No oropharyngeal exudate or posterior oropharyngeal erythema.  Eyes:     Conjunctiva/sclera: Conjunctivae normal.  Cardiovascular:     Rate and Rhythm: Normal rate and regular rhythm.     Heart sounds: Normal heart sounds. No murmur heard. Pulmonary:     Effort: Pulmonary effort is normal. No respiratory distress.     Breath sounds: Normal breath sounds. No wheezing, rhonchi or rales.  Skin:    General: Skin is warm and dry.  Neurological:     Mental Status: She is alert.  Psychiatric:        Mood and Affect: Mood normal.        Thought Content: Thought content normal.      UC Treatments / Results  Labs (all labs ordered are listed, but only abnormal results are  displayed) Labs Reviewed  POC SOFIA SARS ANTIGEN FIA - Normal  CULTURE, GROUP A STREP [facility])  POCT RAPID STREP A (OFFICE)    EKG   Radiology No results found.  Procedures Procedures (including critical care time)  Medications Ordered in UC Medications - No data to display  Initial Impression / Assessment and Plan / UC Course  I have reviewed the triage vital signs and the nursing notes.  Pertinent labs & imaging results that were available during my care of the patient were reviewed by me and considered in my medical decision making (see chart for details).    COVID, strep screening negative in office.  Will order throat culture.  Advised symptomatic treatment otherwise with increased fluids and rest with follow-up if no gradual improvement with any further concerns.  Tessalon  Perles prescribed for cough relief.  Final Clinical Impressions(s) / UC Diagnoses   Final diagnoses:  Acute upper respiratory  infection   Discharge Instructions   None    ED Prescriptions     Medication Sig Dispense Auth. Provider   benzonatate  (TESSALON ) 100 MG capsule Take 1 capsule (100 mg total) by mouth every 8 (eight) hours. 21 capsule Billy Asberry FALCON, PA-C      PDMP not reviewed this encounter.   Billy Asberry FALCON, PA-C 03/25/24 1212

## 2024-03-24 NOTE — ED Triage Notes (Signed)
 This started with sore throat on Monday, it felt like their was broken glass in it, then comes the cough with now my chest/ribs sore from the Cough. Today with some ha from all these symptoms. No known Fever.

## 2024-03-27 ENCOUNTER — Other Ambulatory Visit: Payer: Self-pay | Admitting: Pulmonary Disease

## 2024-03-27 LAB — CULTURE, GROUP A STREP (THRC)

## 2024-03-29 ENCOUNTER — Ambulatory Visit (INDEPENDENT_AMBULATORY_CARE_PROVIDER_SITE_OTHER): Admitting: Adult Health

## 2024-03-29 ENCOUNTER — Telehealth: Payer: Self-pay

## 2024-03-29 ENCOUNTER — Encounter (INDEPENDENT_AMBULATORY_CARE_PROVIDER_SITE_OTHER): Payer: Self-pay | Admitting: Adult Health

## 2024-03-29 ENCOUNTER — Ambulatory Visit: Payer: Self-pay

## 2024-03-29 DIAGNOSIS — E669 Obesity, unspecified: Secondary | ICD-10-CM

## 2024-03-29 DIAGNOSIS — E1159 Type 2 diabetes mellitus with other circulatory complications: Secondary | ICD-10-CM | POA: Diagnosis not present

## 2024-03-29 DIAGNOSIS — E559 Vitamin D deficiency, unspecified: Secondary | ICD-10-CM

## 2024-03-29 DIAGNOSIS — Z6838 Body mass index (BMI) 38.0-38.9, adult: Secondary | ICD-10-CM

## 2024-03-29 DIAGNOSIS — Z7985 Long-term (current) use of injectable non-insulin antidiabetic drugs: Secondary | ICD-10-CM

## 2024-03-29 DIAGNOSIS — E65 Localized adiposity: Secondary | ICD-10-CM

## 2024-03-29 DIAGNOSIS — E1169 Type 2 diabetes mellitus with other specified complication: Secondary | ICD-10-CM | POA: Diagnosis not present

## 2024-03-29 DIAGNOSIS — I152 Hypertension secondary to endocrine disorders: Secondary | ICD-10-CM

## 2024-03-29 MED ORDER — SEMAGLUTIDE (1 MG/DOSE) 4 MG/3ML ~~LOC~~ SOPN
1.0000 mg | PEN_INJECTOR | SUBCUTANEOUS | 0 refills | Status: DC
Start: 2024-03-29 — End: 2024-05-24

## 2024-03-29 NOTE — Progress Notes (Signed)
 WEIGHT SUMMARY AND BIOMETRICS  Vitals Temp: 99 F (37.2 C) BP: 135/89 Pulse Rate: 83 SpO2: 97 %   Anthropometric Measurements Height: 5' 6 (1.676 m) Weight: 239 lb (108.4 kg) BMI (Calculated): 38.59 Weight at Last Visit: 240 lb Weight Lost Since Last Visit: 1 lb Weight Gained Since Last Visit: 0 Starting Weight: 265 lb Total Weight Loss (lbs): 26 lb (11.8 kg) Peak Weight: 295 lb   Body Composition  Body Fat %: 48 % Fat Mass (lbs): 115 lbs Muscle Mass (lbs): 118.2 lbs Total Body Water (lbs): 86.4 lbs Visceral Fat Rating : 16   Other Clinical Data Fasting: yes Labs: yes Today's Visit #: 14 Starting Date: 03/19/23    Chief Complaint:   OBESITY Breanna Ross is here to discuss her progress with her obesity treatment plan.  She is on the the Category 1 Plan and states she is following her eating plan approximately 0 % of the time.  She states she is exercising Walking 15 minutes 3 times per week.  Interim History:  Breanna Ross has been acutely ill with pharyngitis, cough, nasal congestion, HA, and fatigue. She was seen at [facility] on 03/24/2024- Negative COVID and Strep Tests. Provided Tessalon  She was later seen at [facility] on 03/27/2024- started on Doxycycline and Promethazine  Cough Syrup She feels that she has been improving the last several days  She denies fever, night sweats, chills, or respiratory distress  Reviewed Bioemp[employer] results with pt: Muscle Mass: +2.2 lbs Adipose Mass: -2.8 lbs  Subjective:   1. Vitamin D  deficiency  Latest Reference Range & Units 08/21/23 14:52  Vitamin D , 25-Hydroxy 30.0 - 100.0 ng/mL 77.3   She endorses fatigue, r/t to recent URI  2. Type 2 diabetes mellitus with morbid obesity (HCC) She denise sx's of hypoglycemia She is on weekly Ozempic  1mg  Denies mass in neck, dysphagia, dyspepsia, persistent hoarseness, abdominal pain, or N/V/C   3. Visceral obesity  01/15/24 02/16/24 03/29/24  Visceral Fat Rating  16 16  16    Highest Visceral Rating recorded at 18  4. Hypertension associated with type 2 diabetes mellitus (HCC) BP stable at OV She has been acutely ill the last several weeks She denies acute cardiac sx's at present  Assessment/Plan:   1. Vitamin D  deficiency Check Labs - VITAMIN D  25 Hydroxy (Vit-D Deficiency, Fractures)  2. Type 2 diabetes mellitus with morbid obesity (HCC) (Primary) Check Labs - Hemoglobin A1c - Insulin , random Refill - Semaglutide , 1 MG/DOSE, 4 MG/3ML SOPN; Inject 1 mg as directed once a week.  Dispense: 9 mL; Refill: 0  3. Visceral obesity Continue with weight loss efforts  4. Hypertension associated with type 2 diabetes mellitus (HCC) Check Labs - Comprehensive metabolic panel with GFR  5. Obesity (BMI 30-39.9), Current 38.6  Breanna Ross is not currently in the action stage of change. As such, her goal is to maintain weight for now. She has agreed to the Category 1 Plan.   Exercise goals: No exercise has been prescribed at this time.  Behavioral modification strategies: increasing lean protein intake, decreasing simple carbohydrates, increasing vegetables, increasing water intake, no skipping meals, meal planning and cooking strategies, keeping healthy foods in the home, ways to avoid boredom eating, and planning for success.  Breanna Ross has agreed to follow-up with our clinic in 4 weeks. She was informed of the importance of frequent follow-up visits to maximize her success with intensive lifestyle modifications for her multiple health conditions.   Breanna Ross was informed we would  discuss her lab results at her next visit unless there is a critical issue that needs to be addressed sooner. Breanna Ross agreed to keep her next visit at the agreed upon time to discuss these results.  Objective:   Blood pressure 135/89, pulse 83, temperature 99 F (37.2 C), height 5' 6 (1.676 m), weight 239 lb (108.4 kg), SpO2 97%. Body mass index is 38.58 kg/m.  General: Cooperative, alert,  well developed, in no acute distress. HEENT: Conjunctivae and lids unremarkable. Cardiovascular: Regular rhythm.  Lungs: Normal work of breathing. Neurologic: No focal deficits.   Lab Results  Component Value Date   CREATININE 0.8 05/12/2023   BUN 17 05/12/2023   NA 142 05/12/2023   K 3.9 05/12/2023   CL 101 05/12/2023   CO2 26 (A) 05/12/2023   Lab Results  Component Value Date   ALT 23 05/12/2023   AST 19 05/12/2023   ALKPHOS 81 05/12/2023   BILITOT 0.3 03/19/2023   Lab Results  Component Value Date   HGBA1C 5.7 (H) 08/21/2023   HGBA1C 5.7 05/12/2023   HGBA1C 6.1 (H) 03/19/2023   HGBA1C 6.8 (H) 06/10/2016   HGBA1C 6.9 (H) 05/31/2016   Lab Results  Component Value Date   INSULIN  10.6 03/19/2023   Lab Results  Component Value Date   TSH 1.840 03/19/2023   Lab Results  Component Value Date   CHOL 181 05/12/2023   HDL 54 05/12/2023   LDLCALC 108 05/12/2023   TRIG 104 05/12/2023   Lab Results  Component Value Date   VD25OH 77.3 08/21/2023   VD25OH 115.0 05/12/2023   VD25OH 84.6 03/19/2023   Lab Results  Component Value Date   WBC 5.8 05/12/2023   HGB 15.0 05/12/2023   HCT 46 05/12/2023   MCV 88 03/19/2023   PLT 297 05/12/2023   No results found for: IRON, TIBC, FERRITIN  Attestation Statements:   Reviewed by clinician on day of visit: allergies, medications, problem list, medical history, surgical history, family history, social history, and previous encounter notes.  I have reviewed the above documentation for accuracy and completeness, and I agree with the above. -  Breanna Ross d. Breanna Brocker, NP-C

## 2024-03-29 NOTE — Telephone Encounter (Signed)
 VM/ LM - Patient needs to make OV - has not been seen since 2023 / already has had a courtesy fill.

## 2024-03-30 LAB — COMPREHENSIVE METABOLIC PANEL WITH GFR
ALT: 14 IU/L (ref 0–32)
AST: 18 IU/L (ref 0–40)
Albumin: 4.2 g/dL (ref 3.8–4.8)
Alkaline Phosphatase: 92 IU/L (ref 44–121)
BUN/Creatinine Ratio: 16 (ref 12–28)
BUN: 13 mg/dL (ref 8–27)
Bilirubin Total: 0.4 mg/dL (ref 0.0–1.2)
CO2: 24 mmol/L (ref 20–29)
Calcium: 9.4 mg/dL (ref 8.7–10.3)
Chloride: 103 mmol/L (ref 96–106)
Creatinine, Ser: 0.83 mg/dL (ref 0.57–1.00)
Globulin, Total: 3.3 g/dL (ref 1.5–4.5)
Glucose: 76 mg/dL (ref 70–99)
Potassium: 4 mmol/L (ref 3.5–5.2)
Sodium: 142 mmol/L (ref 134–144)
Total Protein: 7.5 g/dL (ref 6.0–8.5)
eGFR: 75 mL/min/1.73 (ref >59)

## 2024-03-30 LAB — HEMOGLOBIN A1C
Est. average glucose Bld gHb Est-mCnc: 111 mg/dL
Hgb A1c MFr Bld: 5.5 % (ref 4.8–5.6)

## 2024-03-30 LAB — INSULIN, RANDOM: INSULIN: 9.9 u[IU]/mL (ref 2.6–24.9)

## 2024-03-30 LAB — VITAMIN D 25 HYDROXY (VIT D DEFICIENCY, FRACTURES): Vit D, 25-Hydroxy: 55.3 ng/mL (ref 30.0–100.0)

## 2024-04-26 ENCOUNTER — Ambulatory Visit (INDEPENDENT_AMBULATORY_CARE_PROVIDER_SITE_OTHER): Admitting: Adult Health

## 2024-04-26 ENCOUNTER — Encounter (INDEPENDENT_AMBULATORY_CARE_PROVIDER_SITE_OTHER): Payer: Self-pay | Admitting: Adult Health

## 2024-04-26 VITALS — BP 132/85 | HR 85 | Temp 98.2°F | Ht 66.0 in | Wt 242.0 lb

## 2024-04-26 DIAGNOSIS — Z7985 Long-term (current) use of injectable non-insulin antidiabetic drugs: Secondary | ICD-10-CM

## 2024-04-26 DIAGNOSIS — E1169 Type 2 diabetes mellitus with other specified complication: Secondary | ICD-10-CM

## 2024-04-26 DIAGNOSIS — E559 Vitamin D deficiency, unspecified: Secondary | ICD-10-CM | POA: Diagnosis not present

## 2024-04-26 DIAGNOSIS — E1159 Type 2 diabetes mellitus with other circulatory complications: Secondary | ICD-10-CM | POA: Diagnosis not present

## 2024-04-26 DIAGNOSIS — I152 Hypertension secondary to endocrine disorders: Secondary | ICD-10-CM | POA: Diagnosis not present

## 2024-04-26 DIAGNOSIS — E65 Localized adiposity: Secondary | ICD-10-CM

## 2024-04-26 DIAGNOSIS — E669 Obesity, unspecified: Secondary | ICD-10-CM

## 2024-04-26 DIAGNOSIS — Z6839 Body mass index (BMI) 39.0-39.9, adult: Secondary | ICD-10-CM

## 2024-04-26 NOTE — Progress Notes (Signed)
 WEIGHT SUMMARY AND BIOMETRICS  Vitals Temp: 98.2 F (36.8 C) BP: 132/85 Pulse Rate: 85 SpO2: 100 %   Anthropometric Measurements Height: 5' 6 (1.676 m) Weight: 242 lb (109.8 kg) BMI (Calculated): 39.08 Weight at Last Visit: 239lb Weight Lost Since Last Visit: 3lb Weight Gained Since Last Visit: 0lb Starting Weight: 265lb Total Weight Loss (lbs): 23 lb (10.4 kg) Peak Weight: 295lb   Body Composition  Body Fat %: 48.9 % Fat Mass (lbs): 118.4 lbs Muscle Mass (lbs): 117.4 lbs Total Body Water (lbs): 90.2 lbs Visceral Fat Rating : 17   Other Clinical Data Fasting: No Labs: no Today's Visit #: 15 Starting Date: 03/19/23    Chief Complaint:   OBESITY Breanna Ross is here to discuss her progress with her obesity treatment plan.  She is on the the Category 1 Plan and states she is following her eating plan approximately 30 % of the time.  She states she is exercising: Walking 30 2-3 days per week.  Interim History:  She reports recent loss, a lifelong best friend in late August 2025. One of her son's closet friends is recently hospitalized for worsening CAD- likely receive cardiac stents this week.  She is interested in [employer] search reveals this trend is a 30 min walking session with alternating every 3 mins between slow and faster pace walking intervals.  Reviewed recent lab at length.  Subjective:   1. Visceral obesity  04/26/24 11:00  Visceral Fat Rating  17   Visceral rating has been holding between 16-18  2. Hypertension associated with diabetes (HCC) Discussed Labs 03/29/2024 CMP: Electrolytes, Liver Enzymes, and Kidney Fx- stable  3. Type 2 diabetes mellitus with morbid obesity (HCC) Discussed Labs  Latest Reference Range & Units 03/29/24 09:59  Glucose 70 - 99 mg/dL 76  Hemoglobin J8R 4.8 - 5.6 % 5.5  Est. average glucose Bld gHb Est-mCnc mg/dL 888  INSULIN  2.6 - 24.9 uIU/mL 9.9   A1c and CBG both at goal] Insulin  level  slightly improved, yet still above goal of 5  She is on weekly Ozempic  1mg  Denies mass in neck, dysphagia, dyspepsia, persistent hoarseness, abdominal pain, or N/V/C   4. Vitamin D  deficiency Discussed Labs  Latest Reference Range & Units 03/29/24 09:59  Vitamin D , 25-Hydroxy 30.0 - 100.0 ng/mL 55.3   Vit D Level is stable and at goal She is on OTC VitD3 5000 international units  is 3 times per week  Assessment/Plan:   1. Visceral obesity Increase compliance on Cat 3 MP Continue regular walking  2. Hypertension associated with diabetes (HCC) Increase compliance on Cat 3 MP Continue regular walking  3. Type 2 diabetes mellitus with morbid obesity (HCC) Increase compliance on Cat 3 MP Continue regular walking  4. Vitamin D  deficiency (Primary) Continue current OTC supplementation  5. Obesity (BMI 30-39.9), Current 39.1  Jasia is not currently in the action stage of change. As such, her goal is to get back to weightloss efforts . She has agreed to the Category 1 Plan.   Exercise goals: Older adults should follow the adult guidelines. When older adults cannot meet the adult guidelines, they should be as physically active as their abilities and conditions will allow.  Older adults should do exercises that maintain or improve balance if they are at risk of falling.  Older adults should determine their level of effort for physical activity relative to their level of fitness.  Older adults with chronic conditions should understand whether and  how their conditions affect their ability to do regular physical activity safely.  Behavioral modification strategies: increasing lean protein intake, decreasing simple carbohydrates, increasing vegetables, increasing water intake, no skipping meals, meal planning and cooking strategies, keeping healthy foods in the home, ways to avoid boredom eating, and planning for success.  Kellie has agreed to follow-up with our clinic in 4 weeks. She was  informed of the importance of frequent follow-up visits to maximize her success with intensive lifestyle modifications for her multiple health conditions.   Objective:   Blood pressure 132/85, pulse 85, temperature 98.2 F (36.8 C), height 5' 6 (1.676 m), weight 242 lb (109.8 kg), SpO2 100%. Body mass index is 39.06 kg/m.  General: Cooperative, alert, well developed, in no acute distress. HEENT: Conjunctivae and lids unremarkable. Cardiovascular: Regular rhythm.  Lungs: Normal work of breathing. Neurologic: No focal deficits.   Lab Results  Component Value Date   CREATININE 0.83 03/29/2024   BUN 13 03/29/2024   NA 142 03/29/2024   K 4.0 03/29/2024   CL 103 03/29/2024   CO2 24 03/29/2024   Lab Results  Component Value Date   ALT 14 03/29/2024   AST 18 03/29/2024   ALKPHOS 92 03/29/2024   BILITOT 0.4 03/29/2024   Lab Results  Component Value Date   HGBA1C 5.5 03/29/2024   HGBA1C 5.7 (H) 08/21/2023   HGBA1C 5.7 05/12/2023   HGBA1C 6.1 (H) 03/19/2023   HGBA1C 6.8 (H) 06/10/2016   Lab Results  Component Value Date   INSULIN  9.9 03/29/2024   INSULIN  10.6 03/19/2023   Lab Results  Component Value Date   TSH 1.840 03/19/2023   Lab Results  Component Value Date   CHOL 181 05/12/2023   HDL 54 05/12/2023   LDLCALC 108 05/12/2023   TRIG 104 05/12/2023   Lab Results  Component Value Date   VD25OH 55.3 03/29/2024   VD25OH 77.3 08/21/2023   VD25OH 115.0 05/12/2023   Lab Results  Component Value Date   WBC 5.8 05/12/2023   HGB 15.0 05/12/2023   HCT 46 05/12/2023   MCV 88 03/19/2023   PLT 297 05/12/2023   No results found for: IRON, TIBC, FERRITIN  Attestation Statements:   Reviewed by clinician on day of visit: allergies, medications, problem list, medical history, surgical history, family history, social history, and previous encounter notes.  I have reviewed the above documentation for accuracy and completeness, and I agree with the above. -  Aarianna Hoadley  d. Annaston Upham, NP-C

## 2024-05-24 ENCOUNTER — Encounter (INDEPENDENT_AMBULATORY_CARE_PROVIDER_SITE_OTHER): Payer: Self-pay | Admitting: Adult Health

## 2024-05-24 ENCOUNTER — Ambulatory Visit (INDEPENDENT_AMBULATORY_CARE_PROVIDER_SITE_OTHER): Payer: Self-pay | Admitting: Adult Health

## 2024-05-24 VITALS — BP 138/82 | HR 81 | Temp 98.5°F | Ht 66.0 in | Wt 240.0 lb

## 2024-05-24 DIAGNOSIS — E65 Localized adiposity: Secondary | ICD-10-CM

## 2024-05-24 DIAGNOSIS — E1169 Type 2 diabetes mellitus with other specified complication: Secondary | ICD-10-CM

## 2024-05-24 DIAGNOSIS — E1159 Type 2 diabetes mellitus with other circulatory complications: Secondary | ICD-10-CM | POA: Diagnosis not present

## 2024-05-24 DIAGNOSIS — Z6838 Body mass index (BMI) 38.0-38.9, adult: Secondary | ICD-10-CM | POA: Diagnosis not present

## 2024-05-24 DIAGNOSIS — I152 Hypertension secondary to endocrine disorders: Secondary | ICD-10-CM

## 2024-05-24 DIAGNOSIS — Z7985 Long-term (current) use of injectable non-insulin antidiabetic drugs: Secondary | ICD-10-CM

## 2024-05-24 DIAGNOSIS — E669 Obesity, unspecified: Secondary | ICD-10-CM | POA: Diagnosis not present

## 2024-05-24 MED ORDER — SEMAGLUTIDE (1 MG/DOSE) 4 MG/3ML ~~LOC~~ SOPN: 1.0000 mg | PEN_INJECTOR | SUBCUTANEOUS | 0 refills | Status: DC

## 2024-05-24 NOTE — Progress Notes (Signed)
 WEIGHT SUMMARY AND BIOMETRICS  Vitals Temp: 98.5 F (36.9 C) BP: 138/82 Pulse Rate: 81 SpO2: 99 %   Anthropometric Measurements Height: 5' 6 (1.676 m) Weight: 240 lb (108.9 kg) BMI (Calculated): 38.76 Weight at Last Visit: 242lb Weight Lost Since Last Visit: 2lb Weight Gained Since Last Visit: 0lb Starting Weight: 265lb Total Weight Loss (lbs): 25 lb (11.3 kg) Peak Weight: 295lb   Body Composition  Body Fat %: 48 % Fat Mass (lbs): 115.2 lbs Muscle Mass (lbs): 118.4 lbs Total Body Water (lbs): 88.8 lbs Visceral Fat Rating : 16   Other Clinical Data Fasting: No Labs: No Today's Visit #: 16 Starting Date: 03/19/23    Chief Complaint:   OBESITY Breanna Ross is here to discuss her progress with her obesity treatment plan.  She is on the the Category 1 Plan and states she is following her eating plan approximately 30 % of the time.  She states she is exercising Walking 15 minutes 2 times per week.  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 Visceral obesity   02/12/23 14:00  Visceral Fat Rating  18    05/24/24 10:00  Visceral Fat Rating  16   2. Hypertension associated with diabetes (HCC) BP at goal  She denies CP with exertion She is on valsartan -hydrochlorothiazide  (DIOVAN -HCT) 320-25 MG per tablet  colesevelam  (WELCHOL ) 625 MG tablet  omega-3 acid ethyl esters (LOVAZA) 1 g capsule  amLODipine (NORVASC) 10 MG tablet  aspirin  EC 81 MG tablet  dilTIAZem HCl (CARDIZEM PO)   3. T2D [name] has been on Ozempic  1mg  since on/about 08/28/2023 Denies mass in neck, dysphagia, dyspepsia, persistent hoarseness,  abdominal pain, or N/V/C  She has not been checking home CBG She denies sx's of hypoglycemia  Assessment/Plan:   1. Visceral obesity (Primary) Continue healthy eating and regular cardiovascular exercise Goal is to loss < 235 lbs by end of Dec 2025  2. Hypertension associated with diabetes (HCC) Continue healthy eating and regular cardiovascular exercise Continue valsartan -hydrochlorothiazide  (DIOVAN -HCT) 320-25 MG per tablet  colesevelam  (WELCHOL ) 625 MG tablet  omega-3 acid ethyl esters (LOVAZA) 1 g capsule  amLODipine (NORVASC) 10 MG tablet  aspirin  EC 81 MG tablet  dilTIAZem HCl (CARDIZEM PO)   3. T2D Refill  Semaglutide , 1 MG/DOSE, 4 MG/3ML SOPN Inject 1 mg as directed once a week. Dispense: 9 mL, Refills: 0 ordered   4.Obesity (BMI 30-39.9), Current 38.7  [name] is currently in the action stage of change. As such, her goal is to continue with weight loss efforts. She has agreed to the Category 1 Plan.   Exercise goals: Older adults should follow the adult guidelines. When older adults cannot meet the adult guidelines, they should be as physically active as their abilities and conditions will allow.  Older adults should do exercises that maintain or improve balance if they are at risk of falling.  Older adults should determine their level of effort for physical activity relative to their level of fitness.  Older adults with chronic conditions should understand whether and how their conditions affect their ability to do regular physical activity safely.  Behavioral modification strategies: increasing lean protein intake, decreasing simple carbohydrates, increasing vegetables, increasing water intake, no skipping meals, meal planning and cooking strategies, keeping healthy foods in the home, ways to avoid boredom eating, and planning for success.  Delila has agreed to follow-up with our clinic in 4 weeks. She was informed of the importance of frequent follow-up visits to maximize her  success with intensive lifestyle modifications for her multiple health conditions.   Objective:   Blood pressure 138/82, pulse 81, temperature 98.5 F (36.9 C), height 5' 6 (1.676 m), weight 240 lb (108.9 kg), SpO2 99%. Body mass index is 38.74 kg/m.  General: Cooperative, alert, well developed, in no acute distress. HEENT: Conjunctivae and lids unremarkable. Cardiovascular: Regular rhythm.  Lungs: Normal work of breathing. Neurologic: No focal deficits.   Lab Results  Component Value Date   CREATININE 0.83 03/29/2024   BUN 13 03/29/2024   NA 142 03/29/2024   K 4.0 03/29/2024   CL 103 03/29/2024   CO2 24 03/29/2024   Lab Results  Component Value Date   ALT 14 03/29/2024   AST 18 03/29/2024   ALKPHOS 92 03/29/2024   BILITOT 0.4 03/29/2024   Lab Results  Component Value Date   HGBA1C 5.5 03/29/2024   HGBA1C 5.7 (H) 08/21/2023   HGBA1C 5.7 05/12/2023   HGBA1C 6.1 (H) 03/19/2023   HGBA1C 6.8 (H) 06/10/2016   Lab Results  Component Value Date   INSULIN  9.9 03/29/2024   INSULIN  10.6 03/19/2023   Lab Results  Component Value Date   TSH 1.840 03/19/2023   Lab Results  Component Value Date   CHOL 181 05/12/2023   HDL 54 05/12/2023   LDLCALC 108 05/12/2023   TRIG 104 05/12/2023   Lab Results  Component Value Date   VD25OH 55.3 03/29/2024   VD25OH 77.3 08/21/2023   VD25OH 115.0 05/12/2023   Lab Results  Component Value Date   WBC 5.8 05/12/2023   HGB 15.0 05/12/2023   HCT 46 05/12/2023   MCV 88 03/19/2023   PLT 297 05/12/2023   No results found for: IRON, TIBC, FERRITIN  Attestation Statements:   Reviewed by clinician on day of visit: allergies, medications, problem list, medical history, surgical history, family history, social history, and previous encounter notes.  I have reviewed the above documentation for accuracy and completeness, and I agree with the above. -  Aibhlinn Kalmar d. Reegan Mctighe, NP-C

## 2024-06-01 ENCOUNTER — Ambulatory Visit: Admitting: Pulmonary Disease

## 2024-06-08 ENCOUNTER — Encounter: Payer: Self-pay | Admitting: Pulmonary Disease

## 2024-06-08 ENCOUNTER — Ambulatory Visit: Admitting: Pulmonary Disease

## 2024-06-08 VITALS — BP 144/90 | HR 81 | Ht 66.0 in | Wt 247.0 lb

## 2024-06-08 DIAGNOSIS — J432 Centrilobular emphysema: Secondary | ICD-10-CM | POA: Diagnosis not present

## 2024-06-08 MED ORDER — BUDESONIDE-FORMOTEROL FUMARATE 160-4.5 MCG/ACT IN AERO: 2.0000 | INHALATION_SPRAY | Freq: Two times a day (BID) | RESPIRATORY_TRACT | 1 refills | Status: DC

## 2024-06-08 MED ORDER — ALBUTEROL SULFATE HFA 108 (90 BASE) MCG/ACT IN AERS: 2.0000 | INHALATION_SPRAY | Freq: Four times a day (QID) | RESPIRATORY_TRACT | 11 refills | Status: AC | PRN

## 2024-06-08 NOTE — Patient Instructions (Addendum)
 Continue Symbicort  2 puffs twice daily - rinse mouth out after each use  Continue to use albuterol  as needed   Continue to take zyrtec and chlortab for allergies.  Follow up in 1 year, call sooner if needed

## 2024-06-08 NOTE — Progress Notes (Signed)
 Established Patient Pulmonology Office Visit   Subjective:  Patient ID: Breanna Ross, female    DOB: 08/05/1950  MRN: [id number]  CC:  Chief Complaint  Patient presents with   Medical Management of Chronic Issues    Pt states refills     Discussed the use of AI scribe software for clinical note transcription with the patient, who gave verbal consent to proceed.  History of Present Illness Breanna Ross is a 73 year old female with asthma and type two diabetes who presents with bronchitis.  She recently experienced a bronchial infection and was treated with steroids. During this episode, she had difficulty breathing without an inhaler.  She uses Symbicort  daily and albuterol  as needed, approximately once or twice a week, without a spacer. She takes Zyrtec daily for allergies.  She experiences occasional nighttime coughing and wheezing, using cough syrup or drops. She sometimes expectorates mucus.  Her past medical history includes sleep apnea, GERD, and asthma. She is a former smoker.      ROS    Current Outpatient Medications:    Acetaminophen  (TYLENOL ) 325 MG CAPS, 1 capsule as needed Orally every 6 hrs, Disp: , Rfl:    acetaminophen  (TYLENOL ) 500 MG tablet, Take 1,000 mg by mouth 2 (two) times daily as needed. , Disp: , Rfl:    albuterol  (PROVENTIL  HFA;VENTOLIN  HFA) 108 (90 BASE) MCG/ACT inhaler, Inhale 2 puffs into the lungs every 6 (six) hours as needed for wheezing or shortness of breath. , Disp: , Rfl:    amLODipine (NORVASC) 10 MG tablet, Take 10 mg by mouth daily. , Disp: , Rfl:    aspirin  EC 81 MG tablet, Take 81 mg by mouth daily., Disp: , Rfl:    B Complex CAPS, as directed Orally, Disp: , Rfl:    benzonatate  (TESSALON ) 100 MG capsule, Take 1 capsule (100 mg total) by mouth every 8 (eight) hours., Disp: 21 capsule, Rfl: 0   betamethasone  dipropionate (DIPROLENE ) 0.05 % ointment, Apply topically as needed., Disp: , Rfl:    budesonide -formoterol  (SYMBICORT )  160-4.5 MCG/ACT inhaler, INHALE 2 PUFFS BY MOUTH INTO THE LUNGS DAILY, Disp: 10.2 each, Rfl: 1   Calcium  Carb-Cholecalciferol  (CALCIUM  + D3 PO), Take 1 tablet by mouth 2 (two) times daily., Disp: , Rfl:    cetirizine (ZYRTEC) 10 MG chewable tablet, 1 tablet Orally Once a day, Disp: , Rfl:    chlorpheniramine (CHLOR-TRIMETON) 4 MG tablet, Take 4 mg by mouth 2 (two) times daily as needed for allergies., Disp: , Rfl:    Cholecalciferol  (VITAMIN D3) 125 MCG (5000 UT) CAPS, Decrease from 50mcg (2000) to (1000) - take 1 tab every other day, Disp: , Rfl:    Coenzyme Q10 (CO Q 10 PO), Take 1 tablet by mouth daily., Disp: , Rfl:    colesevelam  (WELCHOL ) 625 MG tablet, Take 1,875 mg by mouth 2 (two) times daily with a meal. , Disp: , Rfl:    cyanocobalamin  2000 MCG tablet, Take 2,500 mcg by mouth daily., Disp: , Rfl:    diclofenac sodium (VOLTAREN) 1 % GEL, Apply 2 g topically 3 (three) times daily as needed (pain)., Disp: , Rfl:    dilTIAZem HCl (CARDIZEM PO), APPLY PR TID; Duration: 30 DAYS, Disp: , Rfl:    doxycycline (VIBRAMYCIN) 100 MG capsule, 1 capsule., Disp: , Rfl:    esomeprazole (NEXIUM) 40 MG capsule, Take 40 mg by mouth daily before breakfast., Disp: , Rfl:    eszopiclone (LUNESTA) 2 MG TABS tablet, Take  2 mg by mouth at bedtime as needed for sleep. Take immediately before bedtime, Disp: , Rfl:    FLUoxetine  (PROZAC ) 10 MG tablet, 0.5 tabs every other day, Disp: , Rfl:    latanoprost (XALATAN) 0.005 % ophthalmic solution, 1 drop at bedtime., Disp: , Rfl:    Magnesium  400 MG CAPS, Take 400 mg by mouth every evening., Disp: , Rfl:    Meclizine HCl 25 MG CHEW, 1 tablet as needed Orally every 12 hrs, Disp: , Rfl:    Misc Natural Products (IMMUNE ESSENTIALS PO), Take by mouth., Disp: , Rfl:    Multiple Vitamin (MULTI VITAMIN) TABS, 1 tablet Orally Once a day, Disp: , Rfl:    Multiple Vitamins-Minerals (MULTIVITAMIN WITH MINERALS) tablet, Take 1 tablet by mouth daily. Alive, Disp: , Rfl:     omega-3 acid ethyl esters (LOVAZA) 1 g capsule, Take by mouth 2 (two) times daily., Disp: , Rfl:    polyethylene glycol (MIRALAX  / GLYCOLAX ) 17 g packet, Take 17 g by mouth 2 (two) times daily. Until stooling regularly, then once daily or PRN, Disp: 60 packet, Rfl: 0   Probiotic Product (PROBIOTIC BLEND PO), Take by mouth with breakfast, with lunch, and with evening meal., Disp: , Rfl:    promethazine -dextromethorphan (PROMETHAZINE -DM) 6.25-15 MG/5ML syrup, 5ml at bed Orally daily; Duration: 10 days, Disp: , Rfl:    Semaglutide , 1 MG/DOSE, 4 MG/3ML SOPN, Inject 1 mg as directed once a week., Disp: 9 mL, Rfl: 0   UNABLE TO FIND, Med Name: Natures Bounty Hair, Skin and Nails, Disp: , Rfl:    UNABLE TO FIND, Med Name: super beets, Disp: , Rfl:    UNABLE TO FIND, Med Name: cruciferious veggies, Disp: , Rfl:    valsartan -hydrochlorothiazide  (DIOVAN -HCT) 320-25 MG per tablet, Take 1 tablet by mouth daily., Disp: , Rfl:       Objective:  BP (!) 144/90   Pulse 81   Ht 5' 6 (1.676 m) Comment: per pt  Wt 247 lb (112 kg)   SpO2 98%   BMI 39.87 kg/m     Physical Exam Constitutional:      General: She is not in acute distress.    Appearance: Normal appearance.  Eyes:     General: No scleral icterus.    Conjunctiva/sclera: Conjunctivae normal.  Cardiovascular:     Rate and Rhythm: Normal rate and regular rhythm.  Pulmonary:     Breath sounds: No wheezing, rhonchi or rales.  Musculoskeletal:     Right lower leg: No edema.     Left lower leg: No edema.  Skin:    General: Skin is warm and dry.  Neurological:     General: No focal deficit present.      Diagnostic Review:    CT Chest 07/09/21 1. There is mild pulmonary fibrosis in a pattern with apical to basal gradient featuring irregular peripheral interstitial opacity and septal thickening without evidence of bronchiolectasis or honeycombing. No significant air trapping on expiratory phase imaging. Findings are consistent with an  early indeterminate for UIP pattern by ATS pulmonary fibrosis criteria, differential considerations including both NSIP and UIP. Consider ongoing annual follow-up to assess for change in fibrotic findings and pattern. Findings are indeterminate for UIP per consensus guidelines: Diagnosis of Idiopathic Pulmonary Fibrosis: An Official ATS/ERS/JRS/ALAT Clinical Practice Guideline. Am JINNY [employer] Crit Care Med Vol 198, Iss 5, [phone], Mar 22 2017. 2. Minimal emphysema. 3. Cardiomegaly.     Assessment & Plan:   Assessment & Plan Centrilobular emphysema (  HCC)  Orders:   albuterol  (VENTOLIN  HFA) 108 (90 Base) MCG/ACT inhaler; Inhale 2 puffs into the lungs every 6 (six) hours as needed for wheezing or shortness of breath.   budesonide -formoterol  (SYMBICORT ) 160-4.5 MCG/ACT inhaler; Inhale 2 puffs into the lungs 2 (two) times daily.   Assessment and Plan Assessment & Plan Centrilobular emphysema Managed with Symbicort  and albuterol . Albuterol  used 1-2 times weekly. Sometimes experiences nighttime symptoms such as coughing and wheezing. - Continue Symbicort  and albuterol  inhalers as prescribed. - Advised to contact clinic for respiratory issues before urgent care or ER visits.  Allergic rhinitis Controlled with daily Zyrtec. No sinus congestion or drainage. - Continue daily Zyrtec.      No follow-ups on file.   Dorn KATHEE Chill, MD

## 2024-06-28 ENCOUNTER — Ambulatory Visit (INDEPENDENT_AMBULATORY_CARE_PROVIDER_SITE_OTHER): Admitting: Adult Health

## 2024-06-28 ENCOUNTER — Encounter (INDEPENDENT_AMBULATORY_CARE_PROVIDER_SITE_OTHER): Payer: Self-pay | Admitting: Adult Health

## 2024-06-28 DIAGNOSIS — E669 Obesity, unspecified: Secondary | ICD-10-CM

## 2024-06-28 DIAGNOSIS — Z6838 Body mass index (BMI) 38.0-38.9, adult: Secondary | ICD-10-CM

## 2024-06-28 DIAGNOSIS — I152 Hypertension secondary to endocrine disorders: Secondary | ICD-10-CM

## 2024-06-28 DIAGNOSIS — E559 Vitamin D deficiency, unspecified: Secondary | ICD-10-CM

## 2024-06-28 DIAGNOSIS — Z0000 Encounter for general adult medical examination without abnormal findings: Secondary | ICD-10-CM

## 2024-06-28 DIAGNOSIS — E1159 Type 2 diabetes mellitus with other circulatory complications: Secondary | ICD-10-CM

## 2024-06-28 DIAGNOSIS — E1169 Type 2 diabetes mellitus with other specified complication: Secondary | ICD-10-CM

## 2024-06-28 DIAGNOSIS — Z7985 Long-term (current) use of injectable non-insulin antidiabetic drugs: Secondary | ICD-10-CM

## 2024-06-28 MED ORDER — SEMAGLUTIDE (1 MG/DOSE) 4 MG/3ML ~~LOC~~ SOPN: 1.0000 mg | PEN_INJECTOR | SUBCUTANEOUS | 0 refills | Status: AC

## 2024-06-28 NOTE — Progress Notes (Signed)
 WEIGHT SUMMARY AND BIOMETRICS  Vitals Temp: 98.3 F (36.8 C) BP: 125/82 Pulse Rate: 76 SpO2: 97 %   Anthropometric Measurements Height: 5' 6 (1.676 m) Weight: 241 lb (109.3 kg) BMI (Calculated): 38.92 Weight at Last Visit: 240 LB Weight Lost Since Last Visit: 0 Weight Gained Since Last Visit: 1 lb Starting Weight: 265 LB Total Weight Loss (lbs): 24 lb (10.9 kg) Peak Weight: 295 LB   Body Composition  Body Fat %: 48.8 % Fat Mass (lbs): 117.6 lbs Muscle Mass (lbs): 117.2 lbs Total Body Water (lbs): 89.8 lbs Visceral Fat Rating : 17   Other Clinical Data Fasting: NO Labs: NO Today's Visit #: 17 Starting Date: 03/19/23    Chief Complaint:   OBESITY Breanna Ross is here to discuss her progress with her obesity treatment plan.  She is on the the Category 1 Plan and states she is following her eating plan approximately 50 % of the time.  She states she is exercising Walking 15 minutes 3 times per week.  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  Subjective:   1. Healthcare maintenance 06/08/2024 Pulmonology OV 06/14/2024 Medicare Wellness OV with extensive fasting labs with established OV Reviewed labs in CareEverywhere- results stable  2. Type 2 diabetes mellitus with morbid obesity (HCC) She administers weekly Ozempic  1mg  on Thursday Denies mass in neck, dysphagia, dyspepsia, persistent hoarseness, abdominal pain, or N/V/C  She denies sx's of hypoglycemia  3.  Hypertension associated with diabetes (HCC) She is on  valsartan -hydrochlorothiazide  (DIOVAN -HCT) 320-25 MG per tablet  aspirin  EC 81 MG tablet  Semaglutide , 1 MG/DOSE, 4 MG/3ML SOPN   4. Vitamin D  deficiency  Latest Reference Range & Units 05/12/23 00:00 08/21/23 14:52 03/29/24 09:59  Vitamin D , 25-Hydroxy 30.0 - 100.0 ng/mL 115.0 (E) 77.3 55.3   Vit D level stable and at goal  Assessment/Plan:   1. Healthcare maintenance Continue healthy eating and regular walking Monitor Labs  2. Type 2 diabetes mellitus with morbid obesity (HCC) (Primary) Refill - Semaglutide , 1 MG/DOSE, 4 MG/3ML SOPN; Inject 1 mg as directed once a week.  Dispense: 9 mL; Refill: 0  3. Hypertension associated with diabetes (HCC) Continue healthy eating and regular walking Monitor Labs  4. Vitamin D  deficiency Monitor Labs  5. Obesity (BMI 30-39.9), Current 38.9   Breanna Ross is currently in the action stage of change. As such, her goal is to continue with weight loss efforts. She has agreed to the Category 1 Plan.   Exercise goals: Older adults should follow the adult guidelines. When older adults cannot meet the adult guidelines, they should be as physically active as their abilities and conditions will allow.  Older adults should do exercises that maintain or improve balance if they are at risk of falling.  Older adults should determine their level of effort for physical activity relative to their level of fitness.  Older adults with chronic conditions should understand whether and how their conditions affect their ability to do regular physical activity safely.  Behavioral modification strategies: increasing lean protein intake, decreasing  simple carbohydrates, increasing vegetables, increasing water intake, no skipping meals, meal planning and cooking strategies, keeping healthy foods in the home, ways to avoid boredom eating, and planning for success.  Breanna Ross has agreed to follow-up with our clinic in 4 weeks.  She was informed of the importance of frequent follow-up visits to maximize her success with intensive lifestyle modifications for her multiple health conditions.   Objective:   Blood pressure 125/82, pulse 76, temperature 98.3 F (36.8 C), height 5' 6 (1.676 m), weight 241 lb (109.3 kg), SpO2 97%. Body mass index is 38.9 kg/m.  General: Cooperative, alert, well developed, in no acute distress. HEENT: Conjunctivae and lids unremarkable. Cardiovascular: Regular rhythm.  Lungs: Normal work of breathing. Neurologic: No focal deficits.   Lab Results  Component Value Date   CREATININE 0.83 03/29/2024   BUN 13 03/29/2024   NA 142 03/29/2024   K 4.0 03/29/2024   CL 103 03/29/2024   CO2 24 03/29/2024   Lab Results  Component Value Date   ALT 14 03/29/2024   AST 18 03/29/2024   ALKPHOS 92 03/29/2024   BILITOT 0.4 03/29/2024   Lab Results  Component Value Date   HGBA1C 5.5 03/29/2024   HGBA1C 5.7 (H) 08/21/2023   HGBA1C 5.7 05/12/2023   HGBA1C 6.1 (H) 03/19/2023   HGBA1C 6.8 (H) 06/10/2016   Lab Results  Component Value Date   INSULIN  9.9 03/29/2024   INSULIN  10.6 03/19/2023   Lab Results  Component Value Date   TSH 1.840 03/19/2023   Lab Results  Component Value Date   CHOL 181 05/12/2023   HDL 54 05/12/2023   LDLCALC 108 05/12/2023   TRIG 104 05/12/2023   Lab Results  Component Value Date   VD25OH 55.3 03/29/2024   VD25OH 77.3 08/21/2023   VD25OH 115.0 05/12/2023   Lab Results  Component Value Date   WBC 5.8 05/12/2023   HGB 15.0 05/12/2023   HCT 46 05/12/2023   MCV 88 03/19/2023   PLT 297 05/12/2023   No results found for: IRON, TIBC, FERRITIN  Attestation Statements:   Reviewed by clinician on day of visit: allergies, medications, problem list, medical history, surgical history, family history, social history, and previous encounter notes.  I have reviewed the above documentation for accuracy and completeness, and I agree with the above. -   Sagan Maselli d. Fordyce Lepak, NP-C

## 2024-07-31 ENCOUNTER — Other Ambulatory Visit: Payer: Self-pay | Admitting: Pulmonary Disease

## 2024-07-31 DIAGNOSIS — J432 Centrilobular emphysema: Secondary | ICD-10-CM

## 2024-08-02 ENCOUNTER — Telehealth (INDEPENDENT_AMBULATORY_CARE_PROVIDER_SITE_OTHER): Payer: Self-pay | Admitting: Adult Health

## 2024-08-02 ENCOUNTER — Ambulatory Visit (INDEPENDENT_AMBULATORY_CARE_PROVIDER_SITE_OTHER): Admitting: Adult Health

## 2024-08-02 NOTE — Telephone Encounter (Signed)
 Pt cxled her appt for 1/12 due to a death in her family. She stated she has had several death's in her family recently. She will call to r/s. She asked that I let Lonell know.
